# Patient Record
Sex: Female | Born: 1980 | Race: White | Hispanic: No | Marital: Married | Smoking: Current every day smoker
Health system: Southern US, Community
[De-identification: ages and names within clinical notes are randomized; demographics above are authoritative.]

## PROBLEM LIST (undated history)

## (undated) ENCOUNTER — Inpatient Hospital Stay (HOSPITAL_COMMUNITY): Payer: Self-pay

## (undated) DIAGNOSIS — F603 Borderline personality disorder: Secondary | ICD-10-CM

## (undated) DIAGNOSIS — F410 Panic disorder [episodic paroxysmal anxiety] without agoraphobia: Secondary | ICD-10-CM

## (undated) DIAGNOSIS — J45909 Unspecified asthma, uncomplicated: Secondary | ICD-10-CM

## (undated) DIAGNOSIS — G43909 Migraine, unspecified, not intractable, without status migrainosus: Secondary | ICD-10-CM

## (undated) DIAGNOSIS — E039 Hypothyroidism, unspecified: Secondary | ICD-10-CM

## (undated) DIAGNOSIS — F32A Depression, unspecified: Secondary | ICD-10-CM

## (undated) DIAGNOSIS — IMO0002 Reserved for concepts with insufficient information to code with codable children: Secondary | ICD-10-CM

## (undated) DIAGNOSIS — B977 Papillomavirus as the cause of diseases classified elsewhere: Secondary | ICD-10-CM

## (undated) DIAGNOSIS — R12 Heartburn: Secondary | ICD-10-CM

## (undated) DIAGNOSIS — F319 Bipolar disorder, unspecified: Secondary | ICD-10-CM

## (undated) DIAGNOSIS — O26899 Other specified pregnancy related conditions, unspecified trimester: Secondary | ICD-10-CM

## (undated) DIAGNOSIS — R87619 Unspecified abnormal cytological findings in specimens from cervix uteri: Secondary | ICD-10-CM

## (undated) DIAGNOSIS — F4310 Post-traumatic stress disorder, unspecified: Secondary | ICD-10-CM

## (undated) DIAGNOSIS — F259 Schizoaffective disorder, unspecified: Secondary | ICD-10-CM

## (undated) DIAGNOSIS — F419 Anxiety disorder, unspecified: Secondary | ICD-10-CM

## (undated) DIAGNOSIS — M419 Scoliosis, unspecified: Secondary | ICD-10-CM

## (undated) HISTORY — PX: COLPOSCOPY: SHX161

## (undated) HISTORY — DX: Schizoaffective disorder, unspecified: F25.9

## (undated) HISTORY — PX: WISDOM TOOTH EXTRACTION: SHX21

## (undated) HISTORY — PX: DILATION AND CURETTAGE OF UTERUS: SHX78

## (undated) HISTORY — PX: TUBAL LIGATION: SHX77

---

## 2007-10-26 HISTORY — PX: WISDOM TOOTH EXTRACTION: SHX21

## 2011-07-11 ENCOUNTER — Emergency Department (HOSPITAL_COMMUNITY)
Admission: EM | Admit: 2011-07-11 | Discharge: 2011-07-11 | Disposition: A | Discharge status: Home / Self Care | Payer: Self-pay | Attending: Emergency Medicine | Admitting: Emergency Medicine

## 2011-07-11 ENCOUNTER — Emergency Department (HOSPITAL_COMMUNITY): Payer: Self-pay

## 2011-07-11 DIAGNOSIS — R0989 Other specified symptoms and signs involving the circulatory and respiratory systems: Secondary | ICD-10-CM | POA: Insufficient documentation

## 2011-07-11 DIAGNOSIS — H5710 Ocular pain, unspecified eye: Secondary | ICD-10-CM | POA: Insufficient documentation

## 2011-07-11 DIAGNOSIS — R51 Headache: Secondary | ICD-10-CM | POA: Insufficient documentation

## 2011-07-11 DIAGNOSIS — R109 Unspecified abdominal pain: Secondary | ICD-10-CM | POA: Insufficient documentation

## 2011-07-11 DIAGNOSIS — R0609 Other forms of dyspnea: Secondary | ICD-10-CM | POA: Insufficient documentation

## 2011-07-11 DIAGNOSIS — F411 Generalized anxiety disorder: Secondary | ICD-10-CM | POA: Insufficient documentation

## 2011-07-11 LAB — DIFFERENTIAL
Basophils Absolute: 0.1 10*3/uL (ref 0.0–0.1)
Basophils Relative: 1 % (ref 0–1)
Eosinophils Absolute: 0.2 10*3/uL (ref 0.0–0.7)
Eosinophils Relative: 4 % (ref 0–5)
Monocytes Absolute: 0.5 10*3/uL (ref 0.1–1.0)

## 2011-07-11 LAB — BASIC METABOLIC PANEL
CO2: 26 mEq/L (ref 19–32)
Calcium: 8.7 mg/dL (ref 8.4–10.5)
Creatinine, Ser: 0.73 mg/dL (ref 0.50–1.10)
GFR calc non Af Amer: >60 mL/min (ref >60)
Glucose, Bld: 110 mg/dL — ABNORMAL HIGH (ref 70–99)

## 2011-07-11 LAB — URINALYSIS, ROUTINE W REFLEX MICROSCOPIC
Glucose, UA: NEGATIVE mg/dL
Ketones, ur: NEGATIVE mg/dL
Leukocytes, UA: NEGATIVE
Protein, ur: NEGATIVE mg/dL
Urobilinogen, UA: 1 mg/dL (ref 0.0–1.0)

## 2011-07-11 LAB — CBC
HCT: 33.3 % — ABNORMAL LOW (ref 36.0–46.0)
MCHC: 34.2 g/dL (ref 30.0–36.0)
Platelets: 277 10*3/uL (ref 150–400)
RDW: 13.6 % (ref 11.5–15.5)
WBC: 5.6 10*3/uL (ref 4.0–10.5)

## 2011-07-20 ENCOUNTER — Emergency Department (HOSPITAL_COMMUNITY)
Admission: EM | Admit: 2011-07-20 | Discharge: 2011-07-20 | Disposition: A | Discharge status: Home / Self Care | Payer: Medicaid Other | Attending: Emergency Medicine | Admitting: Emergency Medicine

## 2011-07-20 ENCOUNTER — Emergency Department (HOSPITAL_COMMUNITY): Payer: Medicaid Other

## 2011-07-20 DIAGNOSIS — R070 Pain in throat: Secondary | ICD-10-CM | POA: Insufficient documentation

## 2011-07-20 DIAGNOSIS — R05 Cough: Secondary | ICD-10-CM | POA: Insufficient documentation

## 2011-07-20 DIAGNOSIS — R0609 Other forms of dyspnea: Secondary | ICD-10-CM | POA: Insufficient documentation

## 2011-07-20 DIAGNOSIS — J3489 Other specified disorders of nose and nasal sinuses: Secondary | ICD-10-CM | POA: Insufficient documentation

## 2011-07-20 DIAGNOSIS — IMO0001 Reserved for inherently not codable concepts without codable children: Secondary | ICD-10-CM | POA: Insufficient documentation

## 2011-07-20 DIAGNOSIS — R0989 Other specified symptoms and signs involving the circulatory and respiratory systems: Secondary | ICD-10-CM | POA: Insufficient documentation

## 2011-07-20 DIAGNOSIS — R059 Cough, unspecified: Secondary | ICD-10-CM | POA: Insufficient documentation

## 2011-07-20 DIAGNOSIS — J40 Bronchitis, not specified as acute or chronic: Secondary | ICD-10-CM | POA: Insufficient documentation

## 2011-08-04 ENCOUNTER — Inpatient Hospital Stay (HOSPITAL_COMMUNITY)
Admission: AD | Admit: 2011-08-04 | Discharge: 2011-08-04 | Disposition: A | Discharge status: Home / Self Care | Payer: Medicaid Other | Source: Ambulatory Visit | Attending: Family Medicine | Admitting: Family Medicine

## 2011-08-04 ENCOUNTER — Encounter (HOSPITAL_COMMUNITY): Payer: Self-pay

## 2011-08-04 DIAGNOSIS — N921 Excessive and frequent menstruation with irregular cycle: Secondary | ICD-10-CM | POA: Insufficient documentation

## 2011-08-04 DIAGNOSIS — N926 Irregular menstruation, unspecified: Secondary | ICD-10-CM

## 2011-08-04 HISTORY — DX: Migraine, unspecified, not intractable, without status migrainosus: G43.909

## 2011-08-04 HISTORY — DX: Post-traumatic stress disorder, unspecified: F43.10

## 2011-08-04 HISTORY — DX: Borderline personality disorder: F60.3

## 2011-08-04 HISTORY — DX: Papillomavirus as the cause of diseases classified elsewhere: B97.7

## 2011-08-04 LAB — URINALYSIS, ROUTINE W REFLEX MICROSCOPIC
Bilirubin Urine: NEGATIVE
Glucose, UA: NEGATIVE mg/dL
Ketones, ur: NEGATIVE mg/dL
Protein, ur: NEGATIVE mg/dL
pH: 6 (ref 5.0–8.0)

## 2011-08-04 LAB — POCT PREGNANCY, URINE: Preg Test, Ur: NEGATIVE

## 2011-08-04 LAB — URINE MICROSCOPIC-ADD ON

## 2011-08-04 NOTE — ED Provider Notes (Signed)
Chart reviewed and agree with management and plan.  

## 2011-08-04 NOTE — ED Provider Notes (Signed)
History     CSN: [id number] Arrival date & time: 08/04/2011  2:05 PM  Chief Complaint  Patient presents with  . Metrorrhagia    HPI Suzanne Klein is a 30 y.o. female who presents to [facility] for pregnancy test and to become an established GYN patient. She states that her periods have changed from their usual. She has always had heavy bleeding every month that last for 7 days and for the past couple months they have only lasted 3 to 5 days.  She has had 2 full term deliveries. Her last period started 4 days ago and has already stopped.  No past medical history on file.  No past surgical history on file.  No family history on file.  History  Substance Use Topics  . Smoking status: Not on file  . Smokeless tobacco: Not on file  . Alcohol Use: Not on file    OB History    No data available      Review of Systems  Allergies  Review of patient's allergies indicates not on file.  Home Medications  No current outpatient prescriptions on file.  BP 109/55  Pulse 88  Temp(Src) 98.4 F (36.9 C) (Oral)  Resp 20  SpO2 98%  LMP 07/04/2011  Physical Exam  Nursing note and vitals reviewed. Constitutional: She is oriented to person, place, and time.       Obese white female in no acute distress.  HENT:  Head: Normocephalic.  Eyes: EOM are normal.  Neck: Neck supple.  Cardiovascular: Normal rate.   Pulmonary/Chest: Effort normal.  Musculoskeletal: Normal range of motion.  Neurological: She is alert and oriented to person, place, and time.  Skin: Skin is warm and dry.    ED Course  Procedures  Results for orders placed during the hospital encounter of 08/04/11 (from the past 24 hour(s))  POCT PREGNANCY, URINE     Status: Normal   Collection Time   08/04/11  2:27 PM      Component Value Range   Preg Test, Ur NEGATIVE     Assessment: Change in menses  Plan:  Appointment with GYN Clinic Monday November 19th at 1pm MDM          Kerrie Buffalo, NP 08/04/11 1446

## 2011-08-04 NOTE — Progress Notes (Signed)
Pt states she has been having some symptoms of pregnancy and her last period did not last as long. Wants to have a pregnancy test.

## 2011-08-05 ENCOUNTER — Inpatient Hospital Stay (INDEPENDENT_AMBULATORY_CARE_PROVIDER_SITE_OTHER)
Admission: RE | Admit: 2011-08-05 | Discharge: 2011-08-05 | Disposition: A | Discharge status: Home / Self Care | Payer: Self-pay | Source: Ambulatory Visit | Attending: Family Medicine | Admitting: Family Medicine

## 2011-08-05 DIAGNOSIS — A048 Other specified bacterial intestinal infections: Secondary | ICD-10-CM

## 2011-08-05 LAB — POCT URINALYSIS DIP (DEVICE)
Bilirubin Urine: NEGATIVE
Glucose, UA: NEGATIVE mg/dL
Leukocytes, UA: NEGATIVE
Nitrite: NEGATIVE

## 2011-08-05 LAB — POCT PREGNANCY, URINE: Preg Test, Ur: NEGATIVE

## 2011-08-05 LAB — POCT H PYLORI SCREEN: H. PYLORI SCREEN, POC: POSITIVE — AB

## 2011-09-13 ENCOUNTER — Encounter: Payer: Medicaid Other | Admitting: Obstetrics and Gynecology

## 2011-11-03 ENCOUNTER — Encounter (HOSPITAL_COMMUNITY): Payer: Self-pay | Admitting: Emergency Medicine

## 2011-11-03 ENCOUNTER — Emergency Department (HOSPITAL_COMMUNITY)
Admission: EM | Admit: 2011-11-03 | Discharge: 2011-11-03 | Disposition: A | Discharge status: Home / Self Care | Payer: Medicaid Other | Attending: Emergency Medicine | Admitting: Emergency Medicine

## 2011-11-03 DIAGNOSIS — D2360 Other benign neoplasm of skin of unspecified upper limb, including shoulder: Secondary | ICD-10-CM | POA: Insufficient documentation

## 2011-11-03 DIAGNOSIS — D229 Melanocytic nevi, unspecified: Secondary | ICD-10-CM

## 2011-11-03 LAB — GLUCOSE, CAPILLARY: Glucose-Capillary: 84 mg/dL (ref 70–99)

## 2011-11-03 MED ORDER — OXYCODONE-ACETAMINOPHEN 5-325 MG PO TABS
1.0000 | ORAL_TABLET | Freq: Once | ORAL | Status: AC
  Administered 2011-11-03: 1 via ORAL

## 2011-11-03 MED ORDER — MUPIROCIN CALCIUM 2 % EX CREA: TOPICAL_CREAM | Freq: Three times a day (TID) | CUTANEOUS | Status: AC

## 2011-11-03 MED ORDER — OXYCODONE-ACETAMINOPHEN 5-325 MG PO TABS
ORAL_TABLET | ORAL | Status: AC
  Filled 2011-11-03: qty 1

## 2011-11-03 MED ORDER — NAPROXEN 500 MG PO TABS: 500.0000 mg | ORAL_TABLET | Freq: Two times a day (BID) | ORAL | Status: DC

## 2011-11-03 NOTE — ED Notes (Signed)
CBG CHECKED BY EMT R [id number]

## 2011-11-03 NOTE — ED Provider Notes (Signed)
History     CSN: [id number]  Arrival date & time 11/03/11  1622   First MD Initiated Contact with Patient 11/03/11 1906      Chief Complaint  Patient presents with  . Rash    (Consider location/radiation/quality/duration/timing/severity/associated sxs/prior treatment) HPI Patient presents to the emergency room with complaints of a painful mole on her right axillary region. Patient states most been there for a prolonged period of time but for the last 2 week she's noted some irritation and discomfort. Patient spoke to someone for a resource Center who told her to the emergency room they could remove it. Patient denies any fevers or coughing. There's been no drainage. Past Medical History  Diagnosis Date  . HPV in female   . Migraines   . HPV in female   . PTSD (post-traumatic stress disorder)   . Borderline personality disorder     Past Surgical History  Procedure Date  . Cesarean section   . Colposcopy   . Wisdom tooth extraction     Family History  Problem Relation Age of Onset  . Cancer Mother   . Heart disease Mother     History  Substance Use Topics  . Smoking status: Current Everyday Smoker -- 0.5 packs/day  . Smokeless tobacco: Not on file  . Alcohol Use: No    OB History    Grav Para Term Preterm Abortions TAB SAB Ect Mult Living   3 1 1  1 1    2       Review of Systems  Constitutional: Negative for fever.    Allergies  Review of patient's allergies indicates no known allergies.  Home Medications  No current outpatient prescriptions on file.  BP 114/58  Pulse 86  Temp 98.6 F (37 C)  Resp 16  SpO2 99%  LMP 10/25/2011  Physical Exam  Nursing note and vitals reviewed. Constitutional: She appears well-developed and well-nourished. No distress.  HENT:  Head: Normocephalic and atraumatic.  Right Ear: External ear normal.  Left Ear: External ear normal.  Eyes: Conjunctivae are normal. Right eye exhibits no discharge. Left eye exhibits no  discharge. No scleral icterus.  Neck: Neck supple. No tracheal deviation present.  Cardiovascular: Normal rate.   Pulmonary/Chest: Effort normal. No stridor. No respiratory distress.  Musculoskeletal: She exhibits no edema.  Neurological: She is alert. Cranial nerve deficit: no gross deficits.  Skin: Skin is warm and dry. No rash noted.       Raised proximal half centimeter nevus right axillary region, scab on the superior aspect of the, tenderness palpation, no lymphangitic streaking, mild erythema of the base  Psychiatric: She has a normal mood and affect.    ED Course  Procedures (including critical care time)   Labs Reviewed  GLUCOSE, CAPILLARY   No results found.   Diagnosis: Nevus   MDM  Patient appears in a nevus that seemed to be getting irritated possibly from her clothing. There may be a mild component of a skin infection associated with it. There is no evidence of abscess. Patient will prescribe medications for pain. I will prescribe a antibiotic ointment. I will give her a referral to a dermatologist for possible excisional biopsy       Celene Kras, MD 11/03/11 [phone]

## 2011-11-03 NOTE — ED Notes (Signed)
Pt c/o painful mole under right arm for approx 2 weeks.

## 2011-12-03 ENCOUNTER — Emergency Department (HOSPITAL_COMMUNITY)
Admission: EM | Admit: 2011-12-03 | Discharge: 2011-12-03 | Disposition: A | Discharge status: Home / Self Care | Payer: Medicaid Other | Attending: Emergency Medicine | Admitting: Emergency Medicine

## 2011-12-03 ENCOUNTER — Encounter (HOSPITAL_COMMUNITY): Payer: Self-pay | Admitting: Emergency Medicine

## 2011-12-03 DIAGNOSIS — G43909 Migraine, unspecified, not intractable, without status migrainosus: Secondary | ICD-10-CM | POA: Insufficient documentation

## 2011-12-03 DIAGNOSIS — Z3201 Encounter for pregnancy test, result positive: Secondary | ICD-10-CM | POA: Insufficient documentation

## 2011-12-03 DIAGNOSIS — H53149 Visual discomfort, unspecified: Secondary | ICD-10-CM | POA: Insufficient documentation

## 2011-12-03 DIAGNOSIS — F17200 Nicotine dependence, unspecified, uncomplicated: Secondary | ICD-10-CM | POA: Insufficient documentation

## 2011-12-03 DIAGNOSIS — R112 Nausea with vomiting, unspecified: Secondary | ICD-10-CM | POA: Insufficient documentation

## 2011-12-03 LAB — DIFFERENTIAL
Eosinophils Absolute: 0.1 10*3/uL (ref 0.0–0.7)
Lymphs Abs: 2.3 10*3/uL (ref 0.7–4.0)
Neutrophils Relative %: 58 % (ref 43–77)

## 2011-12-03 LAB — COMPREHENSIVE METABOLIC PANEL
ALT: 12 U/L (ref 0–35)
Albumin: 3.8 g/dL (ref 3.5–5.2)
Alkaline Phosphatase: 46 U/L (ref 39–117)
GFR calc Af Amer: >90 mL/min (ref >90)
Glucose, Bld: 96 mg/dL (ref 70–99)
Potassium: 3.7 mEq/L (ref 3.5–5.1)
Sodium: 135 mEq/L (ref 135–145)
Total Protein: 7.1 g/dL (ref 6.0–8.3)

## 2011-12-03 LAB — CBC
MCH: 32.4 pg (ref 26.0–34.0)
Platelets: 229 10*3/uL (ref 150–400)
RBC: 4.07 MIL/uL (ref 3.87–5.11)
WBC: 7.1 10*3/uL (ref 4.0–10.5)

## 2011-12-03 LAB — URINALYSIS, ROUTINE W REFLEX MICROSCOPIC
Bilirubin Urine: NEGATIVE
Hgb urine dipstick: NEGATIVE
Specific Gravity, Urine: 1.013 (ref 1.005–1.030)
pH: 6 (ref 5.0–8.0)

## 2011-12-03 MED ORDER — ONDANSETRON 4 MG PO TBDP: 4.0000 mg | ORAL_TABLET | Freq: Once | ORAL | Status: DC

## 2011-12-03 MED ORDER — OXYCODONE-ACETAMINOPHEN 5-325 MG PO TABS: 1.0000 | ORAL_TABLET | Freq: Once | ORAL | Status: DC

## 2011-12-03 MED ORDER — MORPHINE SULFATE 2 MG/ML IJ SOLN
2.0000 mg | Freq: Once | INTRAMUSCULAR | Status: AC
  Administered 2011-12-03: 2 mg via INTRAVENOUS
  Filled 2011-12-03: qty 1

## 2011-12-03 MED ORDER — ONDANSETRON HCL 4 MG PO TABS: 4.0000 mg | ORAL_TABLET | Freq: Four times a day (QID) | ORAL | Status: AC

## 2011-12-03 MED ORDER — ONDANSETRON HCL 4 MG/2ML IJ SOLN
4.0000 mg | Freq: Once | INTRAMUSCULAR | Status: AC
  Administered 2011-12-03: 4 mg via INTRAVENOUS
  Filled 2011-12-03: qty 2

## 2011-12-03 MED ORDER — SODIUM CHLORIDE 0.9 % IV BOLUS (SEPSIS)
1000.0000 mL | Freq: Once | INTRAVENOUS | Status: AC
  Administered 2011-12-03: 1000 mL via INTRAVENOUS

## 2011-12-03 MED ORDER — MORPHINE SULFATE 4 MG/ML IJ SOLN: 4.0000 mg | Freq: Once | INTRAMUSCULAR | Status: DC

## 2011-12-03 MED ORDER — METOCLOPRAMIDE HCL 10 MG PO TABS: 10.0000 mg | ORAL_TABLET | Freq: Once | ORAL | Status: DC

## 2011-12-03 MED ORDER — IBUPROFEN 200 MG PO TABS: 600.0000 mg | ORAL_TABLET | Freq: Once | ORAL | Status: DC

## 2011-12-03 NOTE — ED Notes (Signed)
Pt d/c home in NAD. Pt voiced no concerns or needs.

## 2011-12-03 NOTE — ED Provider Notes (Signed)
History     CSN: [id number]  Arrival date & time 12/03/11  1217   First MD Initiated Contact with Patient 12/03/11 1228      Chief Complaint  Patient presents with  . Headache  . Emesis   Pt states she has bi-temporal HA since last night with multiple episodes of vomiting and photosensitivity. Pt states it is the same as her previous migraines. She also states she had a positive HCG on Friday and missed her menstrual period last month. No focal weakness, numbness, no abd pain,vaginal bleeding.  (Consider location/radiation/quality/duration/timing/severity/associated sxs/prior treatment) The history is provided by the patient.    Past Medical History  Diagnosis Date  . HPV in female   . Migraines   . HPV in female   . PTSD (post-traumatic stress disorder)   . Borderline personality disorder     Past Surgical History  Procedure Date  . Cesarean section   . Colposcopy   . Wisdom tooth extraction     Family History  Problem Relation Age of Onset  . Cancer Mother   . Heart disease Mother     History  Substance Use Topics  . Smoking status: Current Everyday Smoker -- 0.5 packs/day  . Smokeless tobacco: Not on file  . Alcohol Use: No    OB History    Grav Para Term Preterm Abortions TAB SAB Ect Mult Living   3 1 1  1 1    2       Review of Systems  Constitutional: Negative for fever and chills.  HENT: Negative for sore throat, neck pain and neck stiffness.   Respiratory: Negative for shortness of breath.   Cardiovascular: Negative for chest pain.  Gastrointestinal: Positive for nausea and vomiting. Negative for abdominal pain.  Genitourinary: Negative for flank pain and vaginal bleeding.  Musculoskeletal: Negative for back pain and joint swelling.  Skin: Negative for color change and pallor.  Neurological: Positive for headaches. Negative for dizziness, weakness and light-headedness.    Allergies  Review of patient's allergies indicates no known  allergies.  Home Medications   Current Outpatient Rx  Name Route Sig Dispense Refill  . ACETAMINOPHEN 500 MG PO TABS Oral Take 1,000 mg by mouth every 6 (six) hours as needed. headache      BP 118/50  Pulse 110  Temp(Src) 97.7 F (36.5 C) (Oral)  Resp 18  SpO2 95%  Physical Exam  Nursing note and vitals reviewed. Constitutional: She is oriented to person, place, and time. She appears well-developed and well-nourished. No distress.  HENT:  Head: Normocephalic and atraumatic.  Mouth/Throat: Oropharynx is clear and moist.  Eyes: EOM are normal. Pupils are equal, round, and reactive to light.  Neck: Normal range of motion. Neck supple.  Cardiovascular: Normal rate and regular rhythm.   Pulmonary/Chest: Effort normal and breath sounds normal. No respiratory distress. She has no wheezes. She has no rales.  Abdominal: Soft. Bowel sounds are normal. She exhibits no mass. There is no tenderness. There is no rebound and no guarding.  Musculoskeletal: Normal range of motion. She exhibits no edema and no tenderness.  Lymphadenopathy:    She has no cervical adenopathy.  Neurological: She is alert and oriented to person, place, and time.       5/5 motor, sensation intact.   Skin: Skin is warm and dry. No rash noted. No erythema.  Psychiatric: She has a normal mood and affect. Her behavior is normal.    ED Course  Procedures (including critical  care time)  Labs Reviewed - No data to display No results found.   No diagnosis found.    MDM    Pt states her HA is improved and is asking to be d/c'd home.         Loren Racer, MD 12/03/11 [phone]

## 2011-12-03 NOTE — ED Notes (Signed)
Pt in NAD. IV d/c. Pt states she feels much better.

## 2011-12-03 NOTE — ED Notes (Signed)
Pt c/o frontal migraine HA x 2 days; pt has hx of similar; pt sts LMP was in December and sts she had a positive home pregnancy test; pt sts N/V x several days

## 2011-12-03 NOTE — ED Notes (Signed)
Pt. Tolerated gingerale and crackers

## 2011-12-24 ENCOUNTER — Encounter (HOSPITAL_COMMUNITY): Payer: Self-pay | Admitting: *Deleted

## 2011-12-24 ENCOUNTER — Inpatient Hospital Stay (HOSPITAL_COMMUNITY)
Admission: AD | Admit: 2011-12-24 | Discharge: 2011-12-24 | Disposition: A | Discharge status: Home / Self Care | Payer: Medicaid Other | Source: Ambulatory Visit | Attending: Family Medicine | Admitting: Family Medicine

## 2011-12-24 DIAGNOSIS — O210 Mild hyperemesis gravidarum: Secondary | ICD-10-CM | POA: Insufficient documentation

## 2011-12-24 DIAGNOSIS — O219 Vomiting of pregnancy, unspecified: Secondary | ICD-10-CM

## 2011-12-24 HISTORY — DX: Reserved for concepts with insufficient information to code with codable children: IMO0002

## 2011-12-24 HISTORY — DX: Unspecified abnormal cytological findings in specimens from cervix uteri: R87.619

## 2011-12-24 LAB — URINALYSIS, ROUTINE W REFLEX MICROSCOPIC
Glucose, UA: NEGATIVE mg/dL
Leukocytes, UA: NEGATIVE
Specific Gravity, Urine: 1.025 (ref 1.005–1.030)
pH: 6 (ref 5.0–8.0)

## 2011-12-24 MED ORDER — LACTATED RINGERS IV BOLUS (SEPSIS)
1000.0000 mL | Freq: Once | INTRAVENOUS | Status: AC
  Administered 2011-12-24: 1000 mL via INTRAVENOUS

## 2011-12-24 MED ORDER — ONDANSETRON 8 MG PO TBDP: 8.0000 mg | ORAL_TABLET | Freq: Three times a day (TID) | ORAL | Status: AC | PRN

## 2011-12-24 MED ORDER — PROMETHAZINE HCL 25 MG/ML IJ SOLN
25.0000 mg | Freq: Once | INTRAMUSCULAR | Status: AC
  Administered 2011-12-24: 25 mg via INTRAVENOUS
  Filled 2011-12-24: qty 1

## 2011-12-24 MED ORDER — ONDANSETRON 8 MG PO TBDP
8.0000 mg | ORAL_TABLET | Freq: Once | ORAL | Status: AC
  Administered 2011-12-24: 8 mg via ORAL
  Filled 2011-12-24: qty 1

## 2011-12-24 MED ORDER — PROMETHAZINE HCL 25 MG PO TABS: 25.0000 mg | ORAL_TABLET | Freq: Four times a day (QID) | ORAL | Status: DC | PRN

## 2011-12-24 NOTE — ED Provider Notes (Signed)
Chart reviewed and agree with management and plan.  

## 2011-12-24 NOTE — ED Notes (Addendum)
States when she was seen on 2/8, she was given Morphine for a HA, fluids for dehydration and Zofran for nausea. States Zofran worked for [employer], but not now. Also tried ginger mints without relief.

## 2011-12-24 NOTE — ED Provider Notes (Signed)
History     Chief Complaint  Patient presents with  . Emesis  . Possible Pregnancy  . Abdominal Pain   HPI  Pt is [redacted]w[redacted]d pregnant and complains of nausea and vomiting for 1 1/2 weeks.  She was seen on Feb [facility] at [facility] with N/V headache and was given prescription for Zofran which held her until her prescription ran out.  She denies spotting or bleeding, pain with urination.  Pt has an OB appointment a local OB ?name  in 2 weeks  Past Medical History  Diagnosis Date  . HPV in female   . Migraines   . HPV in female   . PTSD (post-traumatic stress disorder)   . Borderline personality disorder   . Abnormal Pap smear     Past Surgical History  Procedure Date  . Cesarean section   . Colposcopy   . Wisdom tooth extraction     Family History  Problem Relation Age of Onset  . Cancer Mother   . Heart disease Mother     History  Substance Use Topics  . Smoking status: Former Smoker -- 0.5 packs/day    Quit date: 11/26/2011  . Smokeless tobacco: Never Used  . Alcohol Use: No    Allergies: No Known Allergies  Prescriptions prior to admission  Medication Sig Dispense Refill  . acetaminophen (TYLENOL) 500 MG tablet Take 1,000 mg by mouth every 6 (six) hours as needed. headache        ROS Physical Exam   Blood pressure 121/65, pulse 85, temperature 97.6 F (36.4 C), temperature source Oral, resp. rate 20, height 5' 6.5" (1.689 m), weight 220 lb 6.4 oz (99.973 kg), last menstrual period 10/21/2011.  Physical Exam  Vitals reviewed. Constitutional: She is oriented to person, place, and time. She appears well-developed and well-nourished.  HENT:  Head: Normocephalic.  Eyes: Pupils are equal, round, and reactive to light.  Neck: Normal range of motion. Neck supple.  Cardiovascular: Normal rate.   Respiratory: Effort normal.  GI: Soft.  Musculoskeletal: Normal range of motion.  Neurological: She is alert and oriented to person, place, and time.  Skin: Skin is warm and dry.    Psychiatric: She has a normal mood and affect.    [facility] Course  Procedures Pt got some relief with zofran but still feels nauseated to almost the point of vomiting.  Also, pt states her headache is getting worse and thinks because she has not eaten Will give IVF and phenergan IV Pt felt much better after IVF and phenergan PO fluids given and tolerated    Assessment and Plan  Nausea and vomiting in pregnancy Prescription for Zofran and Phenergan F/u with OB provider as scheduled  Suzanne Klein 12/24/2011, 12:12 PM

## 2011-12-24 NOTE — Progress Notes (Signed)
Can't keep anything down.  Off and on past month. Worse past wk.   States urine is getting darker. Having abd pain.  First appt is March 5, can't remember what office.

## 2011-12-28 ENCOUNTER — Other Ambulatory Visit: Payer: Self-pay

## 2011-12-28 LAB — OB RESULTS CONSOLE RUBELLA ANTIBODY, IGM: Rubella: IMMUNE

## 2011-12-28 LAB — OB RESULTS CONSOLE ANTIBODY SCREEN: Antibody Screen: NEGATIVE

## 2011-12-28 LAB — OB RESULTS CONSOLE GC/CHLAMYDIA: Gonorrhea: NEGATIVE

## 2012-01-05 ENCOUNTER — Other Ambulatory Visit: Payer: Self-pay | Admitting: Obstetrics

## 2012-01-05 DIAGNOSIS — Z3682 Encounter for antenatal screening for nuchal translucency: Secondary | ICD-10-CM

## 2012-01-10 ENCOUNTER — Ambulatory Visit (HOSPITAL_COMMUNITY)
Admission: RE | Admit: 2012-01-10 | Discharge: 2012-01-10 | Disposition: A | Discharge status: Home / Self Care | Payer: Medicaid Other | Source: Ambulatory Visit | Attending: Obstetrics | Admitting: Obstetrics

## 2012-01-10 ENCOUNTER — Encounter (HOSPITAL_COMMUNITY): Payer: Self-pay

## 2012-01-10 DIAGNOSIS — O358XX0 Maternal care for other (suspected) fetal abnormality and damage, not applicable or unspecified: Secondary | ICD-10-CM | POA: Insufficient documentation

## 2012-01-10 DIAGNOSIS — Z3689 Encounter for other specified antenatal screening: Secondary | ICD-10-CM | POA: Insufficient documentation

## 2012-01-10 DIAGNOSIS — O351XX0 Maternal care for (suspected) chromosomal abnormality in fetus, not applicable or unspecified: Secondary | ICD-10-CM | POA: Insufficient documentation

## 2012-01-10 DIAGNOSIS — Z3682 Encounter for antenatal screening for nuchal translucency: Secondary | ICD-10-CM

## 2012-01-10 DIAGNOSIS — O3510X0 Maternal care for (suspected) chromosomal abnormality in fetus, unspecified, not applicable or unspecified: Secondary | ICD-10-CM | POA: Insufficient documentation

## 2012-01-10 NOTE — ED Notes (Signed)
Patient has history of migraines.  Denies any cramping, leaking or bleeding

## 2012-01-10 NOTE — Progress Notes (Signed)
Genetic Counseling  High-Risk Gestation Note  Appointment Date:  01/10/2012 Referred By: Brock Bad, MD Date of Birth:  Oct 28, 1980 Partner:  Suzanne Klein     Pregnancy History: [id number] Estimated Date of Delivery: 07/27/12 Estimated Gestational Age: [redacted]w[redacted]d Attending: Particia Nearing, MD   [name] and her partner, Suzanne Klein, were seen for genetic counseling because of the previous ultrasound finding of cystic hygroma.   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 Complete ultrasound results reported separately. We discussed this finding in detail with the couple.  They were counseled that a cystic hygroma describes a septated fluid filled sac at the back of the neck that typically results from failure of the fetal lymphatic system.  We discussed the various etiologies for a hygroma including normal variation (immature lymphatic system), a chromosome condition, single gene condition, or a congenital anomaly (heart defect).  We discussed chromosomes, non-disjunction, age related risks for aneuploidy, and the 50-60% ultrasound adjusted risk for a chromosome condition based on the finding of a cystic hygroma.  We discussed the common features of Down syndrome, Turner syndrome, and trisomies 13 and 18.  They were counseled regarding the options of chorionic villus sampling (CVS) and amniocentesis for detection of fetal aneuploidy.  The risks, benefits, and limitations of this procedure were reviewed in detail.  A risk of 1 in 100 was given for CVS and 1 in 200-300 was given for amniocentesis, the primary complication being spontaneous pregnancy loss.  We discussed another type of screening test, noninvasive prenatal testing (NIPT), which utilizes cell free fetal DNA found in the maternal circulation. This test is not diagnostic for chromosome conditions, but can provide information regarding the presence or absence of extra fetal DNA for chromosomes 13, 18 and 21. Thus,  it would not identify or rule out all fetal aneuploidy. The reported detection rate is greater than 99% for Trisomy 21, greater than 97% for Trisomy 18, and is approximately 80% (8 out of 10) for Trisomy 13. The false positive rate is reported to be less than 1% for any of these conditions. After consideration of all the options, the couple elected to return at [redacted] weeks gestation for amniocentesis, and declined cell free fetal DNA testing and CVS. Amniocentesis is scheduled for 02/01/12.   We also discussed that a cystic hygroma can be a feature of an underlying single gene condition, such as Noonan syndrome or SLOS.  We discussed that prenatal diagnosis is available for some single gene conditions, but that in most cases targeted analysis is recommended by a medical geneticist following a post-natal physical exam and review of medical records.  If however, ultrasound findings or a positive family history strongly suggest an increased risk for a specific condition, testing may be performed prenatally.  Additionally, we discussed that congenital heart defects are commonly associated with cystic hygromas.  We reviewed the availability of a fetal echocardiogram at 20+ weeks gestation.  We discussed that the fetal prognosis is largely dependent upon the underlying cause of the hygroma, but that there are indications by ultrasound, such as hydrops, which significantly increase the likelihood of a poor prognosis.  They understand that cystic hygromas may worsen and progress to hydrops fetalis, improve and regress, or remain unchanged at follow up ultrasounds.   Both family histories were reviewed and found to be contributory for the patient's son, with a previous partner, requiring surgical correction of an open heart valve at age 75 years. Congenital heart defects occur in approximately 1% of  pregnancies.  Congenital heart defects may occur due to multifactorial influences, chromosomal abnormalities, genetic  syndromes or environmental exposures.  Isolated heart defects are generally multifactorial.  In the case of a second degree relative to the pregnancy affected and assuming multifactorial inheritance, the recurrence risk for a congenital heart defect is approximately 1-2%.  The patient reported that her son also has attention deficit hyperactivity disorder (ADHD), tourette syndrome, and obsessive compulsive disorder (OCD). 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 The father of the pregnancy reported that he also has mental health concerns. Mental health and behavioral health conditions are typically suspected to have multifactorial inheritance, with a combination of genetics and environment involved in their onset. However, they can also be seen as a feature of an underlying genetic condition. Assuming multifactorial inheritance, recurrence risk would be expected to be increased the couple's child(ren). When a known genetic cause is not identified, prenatal screening or diagnosis is not available for mental health conditions.   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 An underlying cause was not identified. We discussed that there are many different causes of learning delays such as genetic differences, sporadic causes, or injuries.  Without more specific information, potential genetic risks cannot be determined, though recurrence risk for the patient's offspring would be expected to be increased above the general population risk.  When there is not a known cause identified, screening or testing for learning delays is not available prenatally.   The patient reported that her mother has a history of three miscarriages. An underlying cause is not known. Approximately 1 in 6 confirmed pregnancies results in miscarriage. A single underlying cause is more likely to be suspected when a couple has experienced 3 or more losses.  Several possible  causes including chromosome rearrangements, antibodies, and thrombophilia. The majority of causes of recurrent miscarriage would not be expected to impact extended relatives. However, additional information is needed to accurately assess potential implications for relatives.   The patient reported that her maternal grandmother, several of her maternal grandmother's sisters, and her maternal grandmother's brother all have "thick tongues." They also were described to have possible other facial differences. They did not require surgical correction of tongue size and are reportedly otherwise healthy. Without further information regarding the provided family history, an accurate genetic risk cannot be calculated. Further genetic counseling is warranted if more information is obtained.   [name] denied exposure to environmental toxins or chemical agents. She denied the use of alcohol or street drugs.  She reported smoking approximately one pack of cigarettes per day. The associations of smoking in pregnancy were reviewed and cessation encouraged.  She denied significant viral illnesses during the course of her pregnancy. Her medical and surgical histories were contributory for agoraphobia and post traumatic stress disorder. She reported that she is currently not taking medication for treatment of these conditions.  The patient reported one previous TAB.   I counseled this couple regarding the above risks and available options.  The approximate face-to-face time with the genetic counselor was 35 minutes.  Suzanne Plowman, MS,  Certified Genetic Counselor 01/10/2012

## 2012-01-12 ENCOUNTER — Ambulatory Visit (HOSPITAL_COMMUNITY): Payer: Medicaid Other

## 2012-01-13 ENCOUNTER — Encounter (HOSPITAL_COMMUNITY): Payer: Self-pay

## 2012-01-13 ENCOUNTER — Inpatient Hospital Stay (HOSPITAL_COMMUNITY)
Admission: AD | Admit: 2012-01-13 | Discharge: 2012-01-13 | Disposition: A | Discharge status: Home / Self Care | Payer: Medicaid Other | Source: Ambulatory Visit | Attending: Obstetrics | Admitting: Obstetrics

## 2012-01-13 DIAGNOSIS — R51 Headache: Secondary | ICD-10-CM | POA: Insufficient documentation

## 2012-01-13 DIAGNOSIS — O219 Vomiting of pregnancy, unspecified: Secondary | ICD-10-CM

## 2012-01-13 DIAGNOSIS — O210 Mild hyperemesis gravidarum: Secondary | ICD-10-CM | POA: Insufficient documentation

## 2012-01-13 DIAGNOSIS — R1013 Epigastric pain: Secondary | ICD-10-CM | POA: Insufficient documentation

## 2012-01-13 LAB — URINALYSIS, ROUTINE W REFLEX MICROSCOPIC
Ketones, ur: NEGATIVE mg/dL
Leukocytes, UA: NEGATIVE
Nitrite: NEGATIVE
Specific Gravity, Urine: 1.02 (ref 1.005–1.030)
pH: 7.5 (ref 5.0–8.0)

## 2012-01-13 MED ORDER — PROMETHAZINE HCL 25 MG PO TABS
25.0000 mg | ORAL_TABLET | Freq: Four times a day (QID) | ORAL | Status: DC | PRN
Start: 2012-01-13 — End: 2012-07-14

## 2012-01-13 MED ORDER — ONDANSETRON 8 MG PO TBDP
8.0000 mg | ORAL_TABLET | Freq: Once | ORAL | Status: AC
  Administered 2012-01-13: 8 mg via ORAL
  Filled 2012-01-13: qty 1

## 2012-01-13 MED ORDER — M.V.I. ADULT IV INJ
10.0000 mL | Freq: Once | INTRAVENOUS | Status: AC
  Administered 2012-01-13: 10 mL via INTRAVENOUS
  Filled 2012-01-13: qty 10

## 2012-01-13 MED ORDER — ACETAMINOPHEN 325 MG PO TABS
650.0000 mg | ORAL_TABLET | Freq: Once | ORAL | Status: AC
  Administered 2012-01-13: 650 mg via ORAL
  Filled 2012-01-13: qty 2

## 2012-01-13 NOTE — Discharge Instructions (Signed)
Morning Sickness Morning sickness is when you feel sick to your stomach (nauseous) during pregnancy. You may feel sick to your stomach and throw up (vomit). You may feel sick in the morning, but you can feel this way any time of day. Some women feel very sick to their stomach and cannot stop throwing up (hyperemesis gravidarum). HOME CARE  Take multivitamins as told by your doctor. Taking multivitamins before getting pregnant can stop or lessen the harshness of morning sickness.   Eat dry toast or unsalted crackers before getting out of bed.   Eat 5 to 6 small meals a day.   Eat dry and bland foods like rice and baked potatoes.   Do not drink liquids with meals. Drink between meals.   Do not eat greasy, fatty, or spicy foods.   Have someone cook for you if the smell of food causes you to feel sick or throw up.   Do not take vitamins with iron, or as told by your doctor.   Eat protein when you need a snack (nuts, yogurt, cheese).   Eat unsweetened gelatins for dessert.   Wear a bracelet used for sea sickness (acupressure wristband).   Go to a doctor that puts thin needles into certain body points (acupuncture) to improve how you feel.   Do not smoke.   Use a humidifier to keep the air in your house free of odors.  GET HELP RIGHT AWAY IF:   You feel very sick to your stomach and cannot stop throwing up.   You pass out (faint).   You have a fever.   You need medicine to feel better.   You feel dizzy or lightheaded.   You are losing weight.   You need help knowing what to eat and what not to eat.  MAKE SURE YOU:   Understand these instructions.   Will watch your condition.   Will get help right away if you are not doing well or get worse.  Document Released: 11/18/2004 Document Revised: 09/30/2011 Document Reviewed: 01/08/2010 [employer] Patient Information 2012 [employer]. 

## 2012-01-13 NOTE — [facility]U Note (Signed)
Pt states has had n/v x3 days. Has vomited 3-4 times since last night. States hasn't been able to eat or drink today. Denies vaginal bleeding. Has mid abdominal pain that she states is b/c of the n/v.

## 2012-01-13 NOTE — MAU Provider Note (Signed)
History     CSN: [id number]  Arrival date and time: 01/13/12 1252   First Provider Initiated Contact with Patient 01/13/12 1313      No chief complaint on file.  HPI  Patient is a 31yr old pregnant female at 56 wk 0 d gestation presenting with 2 day history of nausea, vomiting, epigastric pain and headache.  Also reports dizziness that worsens when standing.  She reports that she ran out of phenergan and zofran yesterday and has not been able to keep food or fluids down since.  She notified her OB but prescription refills have not been called in. Patient also reports mild yellow vaginal discharge, increased urinary frequency and dysuria.  Denies hemoptysis or vaginal bleeding.   Past Medical History  Diagnosis Date  . HPV in female   . Migraines   . HPV in female   . PTSD (post-traumatic stress disorder)   . Borderline personality disorder   . Abnormal Pap smear     Past Surgical History  Procedure Date  . Cesarean section   . Colposcopy   . Wisdom tooth extraction     Family History  Problem Relation Age of Onset  . Cancer Mother   . Heart disease Mother   . Anesthesia problems Neg Hx     History  Substance Use Topics  . Smoking status: Former Smoker -- 0.5 packs/day    Quit date: 11/26/2011  . Smokeless tobacco: Never Used  . Alcohol Use: No    Allergies:  Allergies  Allergen Reactions  . Banana Rash  . Tomato Rash    Prescriptions prior to admission  Medication Sig Dispense Refill  . butalbital-acetaminophen-caffeine (FIORICET, ESGIC) 50-325-40 MG per tablet Take 1 tablet by mouth every 6 (six) hours as needed. For headache      . cholecalciferol (VITAMIN D) 1000 UNITS tablet Take 1,000 Units by mouth daily.      . ondansetron (ZOFRAN-ODT) 8 MG disintegrating tablet Take 8 mg by mouth every 8 (eight) hours as needed. For nausea      . Prenatal Vit-Fe Fumarate-FA (PRENATAL MULTIVITAMIN) TABS Take 1 tablet by mouth at bedtime.      . promethazine  (PHENERGAN) 25 MG tablet Take 25 mg by mouth 2 (two) times daily as needed. For nausea        Review of Systems  Constitutional: Positive for malaise/fatigue. Negative for fever, chills, weight loss and diaphoresis.  HENT: Positive for congestion. Negative for hearing loss and sore throat.   Eyes: Negative for blurred vision, double vision and photophobia.  Respiratory: Positive for cough (reports mild nonproductive cough). Negative for hemoptysis, shortness of breath, wheezing and stridor.   Cardiovascular: Negative for chest pain, palpitations, orthopnea and claudication.  Gastrointestinal: Positive for heartburn, nausea, vomiting and abdominal pain (epigastric). Negative for diarrhea, constipation, blood in stool and melena.  Genitourinary: Positive for dysuria and frequency.  Musculoskeletal: Negative for back pain and falls.  Skin: Negative for itching and rash.  Neurological: Positive for dizziness and headaches. Negative for tingling, tremors, sensory change, speech change, focal weakness, seizures, loss of consciousness and weakness.  Psychiatric/Behavioral: Negative for depression and suicidal ideas.   Physical Exam   Blood pressure 109/66, pulse 84, temperature 97.6 F (36.4 C), temperature source Oral, resp. rate 18, height 5' 6.5" (1.689 m), weight 101.152 kg (223 lb), last menstrual period 10/21/2011.  Physical Exam  Constitutional: She is oriented to person, place, and time. She appears well-developed and well-nourished.  HENT:  Head: Normocephalic  and atraumatic.  Eyes: Pupils are equal, round, and reactive to light.  Neck: Normal range of motion. Neck supple. No tracheal deviation present.  Cardiovascular: Normal rate, regular rhythm and normal heart sounds.  Exam reveals no gallop and no friction rub.   No murmur heard. Respiratory: Effort normal and breath sounds normal. No stridor.  GI: Soft. She exhibits no mass. There is tenderness (epigastric area). There is no  rebound and no guarding.  Neurological: She is alert and oriented to person, place, and time.  Skin: Skin is warm and dry.  Psychiatric: She has a normal mood and affect. Her behavior is normal. Judgment and thought content normal.   Results for orders placed during the hospital encounter of 01/13/12 (from the past 24 hour(s))  URINALYSIS, ROUTINE W REFLEX MICROSCOPIC     Status: Abnormal   Collection Time   01/13/12  1:08 PM      Component Value Range   Color, Urine YELLOW  YELLOW    APPearance HAZY (*) CLEAR    Specific Gravity, Urine 1.020  1.005 - 1.030    pH 7.5  5.0 - 8.0    Glucose, UA NEGATIVE  NEGATIVE (mg/dL)   Hgb urine dipstick NEGATIVE  NEGATIVE    Bilirubin Urine NEGATIVE  NEGATIVE    Ketones, ur NEGATIVE  NEGATIVE (mg/dL)   Protein, ur NEGATIVE  NEGATIVE (mg/dL)   Urobilinogen, UA 0.2  0.0 - 1.0 (mg/dL)   Nitrite NEGATIVE  NEGATIVE    Leukocytes, UA NEGATIVE  NEGATIVE    MAU Course  Procedures 1410- Consult with Dr. Clearance Coots will give 1 liter LR IV,  8mg  Zofran ODT and reassess 14:15 Jeani Sow, NP has assumed care of this patient.  I have reviewed the note and seen the patient and agree with the NP Student.  I spoke to Dr. Clearance Coots and reviewed the plan of care==hydrated with 1 liter of LR with Multivitamins and give Zofran 8mg  ODT for nausea.   15:26  IV fluid in, patient is feeling better except for headache.  Offered Tylenol.  Ordered. Will try crackers before discharge.   WIll discharge to home with Rx   15:45 Tolerated crackers without vomiting.   Patient reported to NP student that she will be homeless at the end of the month.  Social worker was called in.  Assessment and Plan  A:  Nausea and vomiting in pregnancy  P:  Rx for Phenergan 12.5mg  tabs #30.      Stressed importance of taking medication to prevent dehydration.     Keep scheduled appointment with Dr. Ashok Pall, Lillia Abed 01/13/2012, 1:34 PM

## 2012-01-14 NOTE — Progress Notes (Signed)
Clinical Social Work Department BRIEF PSYCHOSOCIAL ASSESSMENT 01/14/2012  Patient:  Suzanne Klein, Suzanne Klein     Account Number:  [id number]     Admit date:  01/13/2012  Clinical Social Worker:  Andy Gauss  Date/Time:  01/13/2012 04:00 PM  Referred by:  RN  Date Referred:  01/13/2012 Referred for  Homelessness   Other Referral:   Interview type:  Family Other interview type:    PSYCHOSOCIAL DATA Living Status:  HUSBAND Admitted from facility:   Level of care:   Primary support name:  Loleta Rose Primary support relationship to patient:  SPOUSE Degree of support available:   Involved    CURRENT CONCERNS Current Concerns  Other - See comment  Behavioral Health Issues   Other Concerns:    SOCIAL WORK ASSESSMENT / PLAN Sw met with pt and spouse at their request.  Pt and spouse told Sw that their lease is going to end April 1st and asked for housing resources. The couple is familiar with housing resources in the area, as they have lived at the [facility], inquired about [facility] (of which they don't meet admission criteria) and involved with [employer] center, [facility].  Additionally, the couple has applied for Section 8/Public Housing and placed on the waiting list.  The couple plans to go speak with Harvie Heck, Interior and spatial designer at the [facility] Monday morning to inquire about readmission to shelter.  Sw told pt about Room at the [facility] but advised that the shelter only accepts female clients. As Sw learned more about the pt, she learned that pt was diagnosed with PTSD and Agoraphobia.  Pt's spouse is currently on disability.  He list his diagnoses as, PTSD, bipolar mixed, conversion disorder and intermittent explosive disorder.  Since the pt and spouse have disabilities, they may qualify for Supportive Housing program.  Sw completed packet with them and waiting for supporting documentation from their physicians.  Pt is currently 3 months.  She a 32 year old  son who lives with her mother due to her instability/homelessness.  She denies a history with CPS.  This couple seems to be very resourceful and connected with communities agencies.  Sw will assist further once paperwork received.   Assessment/plan status:  Information/Referral to [facility] Other assessment/ plan:   Supportive Housing   Information/referral to community resources:   Couple recently started working with [employer].    PATIENT'S/FAMILY'S RESPONSE TO PLAN OF CARE: Pt and spouse thanked Sw for resources.

## 2012-02-01 ENCOUNTER — Ambulatory Visit (HOSPITAL_COMMUNITY)
Admission: RE | Admit: 2012-02-01 | Discharge: 2012-02-01 | Disposition: A | Discharge status: Home / Self Care | Payer: Medicaid Other | Source: Ambulatory Visit | Attending: Obstetrics | Admitting: Obstetrics

## 2012-02-01 ENCOUNTER — Other Ambulatory Visit: Payer: Self-pay

## 2012-02-01 ENCOUNTER — Other Ambulatory Visit: Payer: Self-pay | Admitting: Maternal and Fetal Medicine

## 2012-02-01 ENCOUNTER — Encounter (HOSPITAL_COMMUNITY): Payer: Self-pay

## 2012-02-01 ENCOUNTER — Other Ambulatory Visit (HOSPITAL_COMMUNITY): Payer: Self-pay | Admitting: Maternal and Fetal Medicine

## 2012-02-01 ENCOUNTER — Ambulatory Visit (HOSPITAL_COMMUNITY): Admission: RE | Admit: 2012-02-01 | Payer: Medicaid Other | Source: Ambulatory Visit

## 2012-02-01 ENCOUNTER — Emergency Department: Payer: Self-pay | Admitting: Emergency Medicine

## 2012-02-01 VITALS — BP 108/63 | HR 95 | Wt 219.0 lb

## 2012-02-01 DIAGNOSIS — O34219 Maternal care for unspecified type scar from previous cesarean delivery: Secondary | ICD-10-CM | POA: Insufficient documentation

## 2012-02-01 DIAGNOSIS — Z8751 Personal history of pre-term labor: Secondary | ICD-10-CM | POA: Insufficient documentation

## 2012-02-01 DIAGNOSIS — Z3682 Encounter for antenatal screening for nuchal translucency: Secondary | ICD-10-CM

## 2012-02-01 DIAGNOSIS — O3440 Maternal care for other abnormalities of cervix, unspecified trimester: Secondary | ICD-10-CM | POA: Insufficient documentation

## 2012-02-01 DIAGNOSIS — O352XX0 Maternal care for (suspected) hereditary disease in fetus, not applicable or unspecified: Secondary | ICD-10-CM | POA: Insufficient documentation

## 2012-02-01 DIAGNOSIS — IMO0002 Reserved for concepts with insufficient information to code with codable children: Secondary | ICD-10-CM

## 2012-02-01 DIAGNOSIS — Z1389 Encounter for screening for other disorder: Secondary | ICD-10-CM | POA: Insufficient documentation

## 2012-02-01 DIAGNOSIS — O358XX0 Maternal care for other (suspected) fetal abnormality and damage, not applicable or unspecified: Secondary | ICD-10-CM | POA: Insufficient documentation

## 2012-02-01 DIAGNOSIS — Z363 Encounter for antenatal screening for malformations: Secondary | ICD-10-CM | POA: Insufficient documentation

## 2012-02-01 LAB — ROUTINE CHROMOSOME - KARYOTYPE

## 2012-02-01 LAB — RH IG WORKUP (INCLUDES ABO/RH)
ABO/RH(D): B POS
Gestational Age(Wks): 15

## 2012-02-01 LAB — AP-AFP (ALPHA FETOPROTEIN)

## 2012-02-01 NOTE — ED Notes (Signed)
Blood drawn, blood type confirmed = B positive.

## 2012-02-02 LAB — COMPREHENSIVE METABOLIC PANEL
Calcium, Total: 8.6 mg/dL (ref 8.5–10.1)
Chloride: 108 mmol/L — ABNORMAL HIGH (ref 98–107)
Co2: 21 mmol/L (ref 21–32)
Creatinine: 0.47 mg/dL — ABNORMAL LOW (ref 0.60–1.30)
EGFR (African American): >60
Osmolality: 278 (ref 275–301)
Potassium: 3.7 mmol/L (ref 3.5–5.1)
SGOT(AST): 16 U/L (ref 15–37)
Sodium: 140 mmol/L (ref 136–145)

## 2012-02-02 LAB — URINALYSIS, COMPLETE
Bilirubin,UR: NEGATIVE
Ketone: NEGATIVE
RBC,UR: 1 /HPF (ref 0–5)
Specific Gravity: 1.029 (ref 1.003–1.030)
Squamous Epithelial: 2
WBC UR: 3 /HPF (ref 0–5)

## 2012-02-02 LAB — CBC
HGB: 11.8 g/dL — ABNORMAL LOW (ref 12.0–16.0)
MCH: 33 pg (ref 26.0–34.0)
MCV: 97 fL (ref 80–100)
Platelet: 195 10*3/uL (ref 150–440)
RDW: 13 % (ref 11.5–14.5)
WBC: 7.5 10*3/uL (ref 3.6–11.0)

## 2012-02-08 ENCOUNTER — Telehealth (HOSPITAL_COMMUNITY): Payer: Self-pay | Admitting: MS"

## 2012-02-08 NOTE — Telephone Encounter (Signed)
Called Makita Blow to discuss the final karyotype result from amniocentesis. We reviewed that these are within normal limits (46, XY).  We reviewed the limitations of amniocentesis and that this does not rule out all genetic conditions or birth defects.  All questions were answered to her satisfaction, she was encouraged to call with additional questions or concerns.  Suzanne Plowman, MS Patent attorney

## 2012-02-14 ENCOUNTER — Other Ambulatory Visit: Payer: Self-pay | Admitting: Obstetrics

## 2012-02-21 ENCOUNTER — Other Ambulatory Visit: Payer: Self-pay | Admitting: Obstetrics

## 2012-02-21 DIAGNOSIS — O358XX0 Maternal care for other (suspected) fetal abnormality and damage, not applicable or unspecified: Secondary | ICD-10-CM

## 2012-02-21 DIAGNOSIS — O352XX0 Maternal care for (suspected) hereditary disease in fetus, not applicable or unspecified: Secondary | ICD-10-CM

## 2012-02-21 DIAGNOSIS — O34219 Maternal care for unspecified type scar from previous cesarean delivery: Secondary | ICD-10-CM

## 2012-02-21 DIAGNOSIS — Z8751 Personal history of pre-term labor: Secondary | ICD-10-CM

## 2012-02-21 DIAGNOSIS — O3440 Maternal care for other abnormalities of cervix, unspecified trimester: Secondary | ICD-10-CM

## 2012-02-22 ENCOUNTER — Ambulatory Visit (HOSPITAL_COMMUNITY): Payer: Medicaid Other

## 2012-02-29 ENCOUNTER — Ambulatory Visit (HOSPITAL_COMMUNITY): Admission: RE | Admit: 2012-02-29 | Payer: Medicaid Other | Source: Ambulatory Visit

## 2012-03-07 ENCOUNTER — Ambulatory Visit (HOSPITAL_COMMUNITY)
Admission: RE | Admit: 2012-03-07 | Discharge: 2012-03-07 | Disposition: A | Discharge status: Home / Self Care | Payer: Medicaid Other | Source: Ambulatory Visit | Attending: Obstetrics | Admitting: Obstetrics

## 2012-03-07 VITALS — BP 125/71 | HR 100 | Wt 213.0 lb

## 2012-03-07 DIAGNOSIS — Z8751 Personal history of pre-term labor: Secondary | ICD-10-CM | POA: Insufficient documentation

## 2012-03-07 DIAGNOSIS — O352XX0 Maternal care for (suspected) hereditary disease in fetus, not applicable or unspecified: Secondary | ICD-10-CM | POA: Insufficient documentation

## 2012-03-07 DIAGNOSIS — O358XX0 Maternal care for other (suspected) fetal abnormality and damage, not applicable or unspecified: Secondary | ICD-10-CM | POA: Insufficient documentation

## 2012-03-07 DIAGNOSIS — Z1389 Encounter for screening for other disorder: Secondary | ICD-10-CM | POA: Insufficient documentation

## 2012-03-07 DIAGNOSIS — O34219 Maternal care for unspecified type scar from previous cesarean delivery: Secondary | ICD-10-CM | POA: Insufficient documentation

## 2012-03-07 DIAGNOSIS — O3440 Maternal care for other abnormalities of cervix, unspecified trimester: Secondary | ICD-10-CM | POA: Insufficient documentation

## 2012-03-07 DIAGNOSIS — Z363 Encounter for antenatal screening for malformations: Secondary | ICD-10-CM | POA: Insufficient documentation

## 2012-03-22 ENCOUNTER — Telehealth (HOSPITAL_COMMUNITY): Payer: Self-pay | Admitting: MS"

## 2012-03-22 NOTE — Telephone Encounter (Signed)
Called [name] regarding prenatal consultation with [facility] Pediatric [facility]astic [facility]ive [facility] given ultrasound finding of fetal cleft lip. Appointment is with Dr. Kelly Splinter on 6/14 at 10:45 am. Dr. Kelly Splinter sees patients in [place] at [address], [address] across from [facility]. Phone number for Dr. Leonie Green office at [facility] is [phone].   Reviewed with [name] that follow-up ultrasound is scheduled in our office on 6/11. Patient was encouraged to call with additional questions.   Quinn Plowman, MS Certified Genetic Counselor 03/22/2012 3:48 PM

## 2012-04-02 ENCOUNTER — Observation Stay: Payer: Self-pay | Admitting: Obstetrics and Gynecology

## 2012-04-02 LAB — CBC
HGB: 11.6 g/dL — ABNORMAL LOW (ref 12.0–16.0)
MCV: 97 fL (ref 80–100)
Platelet: 193 10*3/uL (ref 150–440)
RBC: 3.52 10*6/uL — ABNORMAL LOW (ref 3.80–5.20)
RDW: 13 % (ref 11.5–14.5)

## 2012-04-02 LAB — URINALYSIS, COMPLETE
Bilirubin,UR: NEGATIVE
Ph: 6 (ref 4.5–8.0)
Protein: 100
RBC,UR: 1 /HPF (ref 0–5)
WBC UR: 2 /HPF (ref 0–5)

## 2012-04-02 LAB — COMPREHENSIVE METABOLIC PANEL
Albumin: 3 g/dL — ABNORMAL LOW (ref 3.4–5.0)
BUN: 5 mg/dL — ABNORMAL LOW (ref 7–18)
Chloride: 107 mmol/L (ref 98–107)
Co2: 22 mmol/L (ref 21–32)
EGFR (African American): >60
Glucose: 77 mg/dL (ref 65–99)
Potassium: 3.7 mmol/L (ref 3.5–5.1)
SGPT (ALT): 11 U/L — ABNORMAL LOW
Total Protein: 6.5 g/dL (ref 6.4–8.2)

## 2012-04-02 LAB — HCG, QUANTITATIVE, PREGNANCY: Beta Hcg, Quant.: 6993 m[IU]/mL — ABNORMAL HIGH

## 2012-04-04 ENCOUNTER — Ambulatory Visit (HOSPITAL_COMMUNITY): Admission: RE | Admit: 2012-04-04 | Payer: Medicaid Other | Source: Ambulatory Visit

## 2012-04-05 ENCOUNTER — Encounter (HOSPITAL_COMMUNITY): Payer: Self-pay

## 2012-04-05 ENCOUNTER — Ambulatory Visit (HOSPITAL_COMMUNITY)
Admission: RE | Admit: 2012-04-05 | Discharge: 2012-04-05 | Disposition: A | Discharge status: Home / Self Care | Payer: Medicaid Other | Source: Ambulatory Visit | Attending: Obstetrics | Admitting: Obstetrics

## 2012-04-05 VITALS — BP 109/63 | HR 95 | Wt 211.5 lb

## 2012-04-05 DIAGNOSIS — O352XX0 Maternal care for (suspected) hereditary disease in fetus, not applicable or unspecified: Secondary | ICD-10-CM | POA: Insufficient documentation

## 2012-04-05 DIAGNOSIS — O34219 Maternal care for unspecified type scar from previous cesarean delivery: Secondary | ICD-10-CM | POA: Insufficient documentation

## 2012-04-05 DIAGNOSIS — Z8751 Personal history of pre-term labor: Secondary | ICD-10-CM | POA: Insufficient documentation

## 2012-04-05 DIAGNOSIS — O3440 Maternal care for other abnormalities of cervix, unspecified trimester: Secondary | ICD-10-CM | POA: Insufficient documentation

## 2012-04-05 DIAGNOSIS — O358XX0 Maternal care for other (suspected) fetal abnormality and damage, not applicable or unspecified: Secondary | ICD-10-CM | POA: Insufficient documentation

## 2012-04-05 NOTE — Progress Notes (Signed)
Patient seen today  for follow up ultrasound.  See full report in [facility].  Alpha Gula, MD  Patient had a normal fetal echo with Peds cardiology. Amniocentesis showed normal karyotype.  IUP at 24+4 weeks Unilateral cleft lip +/- palate; left Mild ventriculomegaly noted (1.09 cm) Mild left sided renal pylectasis appreciated (5 mm) without calyceal dilation Normal amniotic fluid volume  Measurements consistent with LMP dating    Findings and limitations of the study were discussed with [name].  She was scheduled to meet with Plastic surgery later this week, but now plans to move to [place] early next month and will deliver there. Recommend follow up as clinically indicated.  Patient will need to arrange follow up once she arrives in [place].

## 2012-04-13 ENCOUNTER — Other Ambulatory Visit: Payer: Self-pay | Admitting: Obstetrics

## 2012-04-13 DIAGNOSIS — IMO0002 Reserved for concepts with insufficient information to code with codable children: Secondary | ICD-10-CM

## 2012-04-19 ENCOUNTER — Ambulatory Visit (HOSPITAL_COMMUNITY)
Admission: RE | Admit: 2012-04-19 | Discharge: 2012-04-19 | Disposition: A | Discharge status: Home / Self Care | Payer: Medicaid Other | Source: Ambulatory Visit | Attending: Obstetrics | Admitting: Obstetrics

## 2012-04-19 ENCOUNTER — Encounter (HOSPITAL_COMMUNITY): Payer: Self-pay

## 2012-04-19 VITALS — BP 112/65 | HR 96 | Wt 214.0 lb

## 2012-04-19 DIAGNOSIS — O352XX0 Maternal care for (suspected) hereditary disease in fetus, not applicable or unspecified: Secondary | ICD-10-CM | POA: Insufficient documentation

## 2012-04-19 DIAGNOSIS — O3440 Maternal care for other abnormalities of cervix, unspecified trimester: Secondary | ICD-10-CM | POA: Insufficient documentation

## 2012-04-19 DIAGNOSIS — O358XX0 Maternal care for other (suspected) fetal abnormality and damage, not applicable or unspecified: Secondary | ICD-10-CM | POA: Insufficient documentation

## 2012-04-19 DIAGNOSIS — IMO0002 Reserved for concepts with insufficient information to code with codable children: Secondary | ICD-10-CM

## 2012-04-19 DIAGNOSIS — O350XX0 Maternal care for (suspected) central nervous system malformation in fetus, not applicable or unspecified: Secondary | ICD-10-CM | POA: Insufficient documentation

## 2012-04-19 DIAGNOSIS — O34219 Maternal care for unspecified type scar from previous cesarean delivery: Secondary | ICD-10-CM | POA: Insufficient documentation

## 2012-04-19 DIAGNOSIS — Z8751 Personal history of pre-term labor: Secondary | ICD-10-CM | POA: Insufficient documentation

## 2012-04-19 DIAGNOSIS — O3500X0 Maternal care for (suspected) central nervous system malformation or damage in fetus, unspecified, not applicable or unspecified: Secondary | ICD-10-CM | POA: Insufficient documentation

## 2012-05-17 ENCOUNTER — Ambulatory Visit (HOSPITAL_COMMUNITY): Admission: RE | Admit: 2012-05-17 | Payer: Medicaid Other | Source: Ambulatory Visit

## 2012-06-20 ENCOUNTER — Other Ambulatory Visit: Payer: Self-pay | Admitting: Obstetrics

## 2012-06-20 DIAGNOSIS — IMO0002 Reserved for concepts with insufficient information to code with codable children: Secondary | ICD-10-CM

## 2012-06-23 LAB — OB RESULTS CONSOLE GBS: GBS: NEGATIVE

## 2012-06-28 ENCOUNTER — Ambulatory Visit (HOSPITAL_COMMUNITY): Payer: Medicaid Other

## 2012-07-06 ENCOUNTER — Ambulatory Visit (HOSPITAL_COMMUNITY)
Admission: RE | Admit: 2012-07-06 | Discharge: 2012-07-06 | Disposition: A | Discharge status: Home / Self Care | Payer: Medicaid Other | Source: Ambulatory Visit | Attending: Obstetrics | Admitting: Obstetrics

## 2012-07-06 DIAGNOSIS — Z8751 Personal history of pre-term labor: Secondary | ICD-10-CM | POA: Insufficient documentation

## 2012-07-06 DIAGNOSIS — O3440 Maternal care for other abnormalities of cervix, unspecified trimester: Secondary | ICD-10-CM | POA: Insufficient documentation

## 2012-07-06 DIAGNOSIS — IMO0002 Reserved for concepts with insufficient information to code with codable children: Secondary | ICD-10-CM

## 2012-07-06 DIAGNOSIS — O34219 Maternal care for unspecified type scar from previous cesarean delivery: Secondary | ICD-10-CM | POA: Insufficient documentation

## 2012-07-06 DIAGNOSIS — O352XX0 Maternal care for (suspected) hereditary disease in fetus, not applicable or unspecified: Secondary | ICD-10-CM | POA: Insufficient documentation

## 2012-07-06 DIAGNOSIS — O350XX0 Maternal care for (suspected) central nervous system malformation in fetus, not applicable or unspecified: Secondary | ICD-10-CM | POA: Insufficient documentation

## 2012-07-06 DIAGNOSIS — O3500X0 Maternal care for (suspected) central nervous system malformation or damage in fetus, unspecified, not applicable or unspecified: Secondary | ICD-10-CM | POA: Insufficient documentation

## 2012-07-06 DIAGNOSIS — O358XX0 Maternal care for other (suspected) fetal abnormality and damage, not applicable or unspecified: Secondary | ICD-10-CM | POA: Insufficient documentation

## 2012-07-14 ENCOUNTER — Encounter (HOSPITAL_COMMUNITY): Payer: Self-pay | Admitting: Pharmacist

## 2012-07-18 ENCOUNTER — Encounter (HOSPITAL_COMMUNITY): Payer: Self-pay

## 2012-07-18 ENCOUNTER — Inpatient Hospital Stay (HOSPITAL_COMMUNITY): Admission: RE | Admit: 2012-07-18 | Payer: Medicaid Other | Source: Ambulatory Visit

## 2012-07-18 ENCOUNTER — Encounter (HOSPITAL_COMMUNITY)
Admission: RE | Admit: 2012-07-18 | Discharge: 2012-07-18 | Disposition: A | Discharge status: Home / Self Care | Payer: Medicaid Other | Source: Ambulatory Visit | Attending: Obstetrics | Admitting: Obstetrics

## 2012-07-18 HISTORY — DX: Heartburn: R12

## 2012-07-18 HISTORY — DX: Panic disorder (episodic paroxysmal anxiety): F41.0

## 2012-07-18 HISTORY — DX: Reserved for concepts with insufficient information to code with codable children: IMO0002

## 2012-07-18 HISTORY — DX: Other specified pregnancy related conditions, unspecified trimester: O26.899

## 2012-07-18 LAB — TYPE AND SCREEN
ABO/RH(D): B POS
Antibody Screen: NEGATIVE

## 2012-07-18 LAB — CBC
HCT: 34 % — ABNORMAL LOW (ref 36.0–46.0)
Hemoglobin: 11.4 g/dL — ABNORMAL LOW (ref 12.0–15.0)
MCH: 31.8 pg (ref 26.0–34.0)
MCHC: 33.5 g/dL (ref 30.0–36.0)
MCV: 95 fL (ref 78.0–100.0)
RDW: 13.4 % (ref 11.5–15.5)

## 2012-07-18 LAB — SURGICAL PCR SCREEN: Staphylococcus aureus: NEGATIVE

## 2012-07-18 NOTE — Patient Instructions (Addendum)
   Your procedure is scheduled on: Monday, Sept 30th  Enter through the Main Entrance of [facility] at: 10 am Pick up the phone at the desk and dial [phone] and inform us of your arrival.  Please call this number if you have any problems the morning of surgery: [phone]  Remember: Do not eat food after midnight: Sunday Do not drink clear liquids after: Sunday Take these medicines the morning of surgery with a SIP OF WATER: None  Do not wear jewelry, make-up, or FINGER nail polish No metal in your hair or on your body. Do not wear lotions, powders, perfumes or deodorant. Do not shave 48 hours prior to surgery. Do not bring valuables to the hospital. Contacts, dentures or bridgework may not be worn into surgery.  Leave suitcase in the car. After Surgery it may be brought to your room. For patients being admitted to the hospital, checkout time is 11:00am the day of discharge. Home with husband Jerrod.  Patients discharged on the day of surgery will not be allowed to drive home.     Remember to use your hibiclens as instructed.Please shower with 1/2 bottle the evening before your surgery and the other 1/2 bottle the morning of surgery. Neck down avoiding private area.

## 2012-07-19 LAB — RPR: RPR Ser Ql: NONREACTIVE

## 2012-07-20 ENCOUNTER — Encounter (HOSPITAL_COMMUNITY): Payer: Self-pay

## 2012-07-20 ENCOUNTER — Inpatient Hospital Stay (HOSPITAL_COMMUNITY)
Admission: AD | Admit: 2012-07-20 | Discharge: 2012-07-24 | DRG: 766 | Disposition: A | Discharge status: Home / Self Care | Payer: Medicaid Other | Source: Ambulatory Visit | Attending: Obstetrics | Admitting: Obstetrics

## 2012-07-20 DIAGNOSIS — O34219 Maternal care for unspecified type scar from previous cesarean delivery: Principal | ICD-10-CM | POA: Diagnosis present

## 2012-07-20 DIAGNOSIS — Z98891 History of uterine scar from previous surgery: Secondary | ICD-10-CM

## 2012-07-20 DIAGNOSIS — O479 False labor, unspecified: Secondary | ICD-10-CM | POA: Diagnosis present

## 2012-07-20 LAB — URINALYSIS, ROUTINE W REFLEX MICROSCOPIC
Bilirubin Urine: NEGATIVE
Glucose, UA: NEGATIVE mg/dL
Hgb urine dipstick: NEGATIVE
Ketones, ur: 40 mg/dL — AB
Protein, ur: 30 mg/dL — AB
Urobilinogen, UA: 0.2 mg/dL (ref 0.0–1.0)

## 2012-07-20 LAB — URINE MICROSCOPIC-ADD ON

## 2012-07-20 MED ORDER — DEXTROSE 5 % IN LACTATED RINGERS IV BOLUS
1000.0000 mL | Freq: Once | INTRAVENOUS | Status: AC
  Administered 2012-07-20: 1000 mL via INTRAVENOUS

## 2012-07-20 MED ORDER — ACETAMINOPHEN 325 MG PO TABS
650.0000 mg | ORAL_TABLET | ORAL | Status: DC | PRN
  Administered 2012-07-20: 650 mg via ORAL
  Filled 2012-07-20: qty 2

## 2012-07-20 MED ORDER — LACTATED RINGERS IV SOLN
INTRAVENOUS | Status: DC
  Administered 2012-07-21: 05:00:00 via INTRAVENOUS

## 2012-07-20 MED ORDER — GI COCKTAIL ~~LOC~~
30.0000 mL | ORAL | Status: AC
  Administered 2012-07-20: 30 mL via ORAL
  Filled 2012-07-20: qty 30

## 2012-07-20 MED ORDER — DOCUSATE SODIUM 100 MG PO CAPS
100.0000 mg | ORAL_CAPSULE | Freq: Every day | ORAL | Status: DC
  Administered 2012-07-22 – 2012-07-24 (×3): 100 mg via ORAL
  Filled 2012-07-20 (×4): qty 1

## 2012-07-20 MED ORDER — ZOLPIDEM TARTRATE 5 MG PO TABS
5.0000 mg | ORAL_TABLET | Freq: Every evening | ORAL | Status: DC | PRN
  Administered 2012-07-20 – 2012-07-22 (×2): 5 mg via ORAL
  Filled 2012-07-20 (×2): qty 1

## 2012-07-20 MED ORDER — CALCIUM CARBONATE ANTACID 500 MG PO CHEW
2.0000 | CHEWABLE_TABLET | ORAL | Status: DC | PRN
  Filled 2012-07-20: qty 2

## 2012-07-20 MED ORDER — PRENATAL MULTIVITAMIN CH
1.0000 | ORAL_TABLET | Freq: Every day | ORAL | Status: DC
  Filled 2012-07-20: qty 1

## 2012-07-20 NOTE — MAU Provider Note (Signed)
Chief Complaint:  No chief complaint on file.   First Provider Initiated Contact with Patient 07/20/12 2028      HPI: Suzanne Klein is a 31 y.o. [id number] at 45w5dwho presents to maternity admissions reporting multiple complaints including pelvic pressure, pain with urination, upper abdominal constant burning pain.  Pt reports contractions but indicates they are not regular.  She has a scheduled repeat C/S in 3 days.  She reports good fetal movement, denies LOF, vaginal bleeding, vaginal itching/burning, urinary symptoms, h/a, dizziness, n/v, or fever/chills.    Pregnancy Course:   Past Medical History: Past Medical History  Diagnosis Date  . HPV in female   . Migraines   . HPV in female   . Abnormal Pap smear   . Termination of pregnancy     x 1  . PTSD (post-traumatic stress disorder)     No meds  . Borderline personality disorder     No meds  . Panic anxiety syndrome     no meds  . Heartburn in pregnancy   . Lobar sclerosis     Past Surgical History: Past Surgical History  Procedure Date  . Cesarean section     x 2  . Colposcopy   . Wisdom tooth extraction   . Dilation and curettage of uterus     Family History: Family History  Problem Relation Age of Onset  . Cancer Mother   . Heart disease Mother   . Anesthesia problems Neg Hx     Social History: History  Substance Use Topics  . Smoking status: Current Every Day Smoker -- 0.5 packs/day for 13 years    Types: Cigarettes  . Smokeless tobacco: Never Used  . Alcohol Use: No    Allergies:  Allergies  Allergen Reactions  . Banana Rash  . Latex Rash  . Tomato Rash    Meds:  Prescriptions prior to admission  Medication Sig Dispense Refill  . butalbital-acetaminophen-caffeine (FIORICET, ESGIC) 50-325-40 MG per tablet Take 2 tablets by mouth every 6 (six) hours as needed. For headache      . oxyCODONE-acetaminophen (PERCOCET) 10-650 MG per tablet Take 1 tablet by mouth every 6 (six) hours as needed.  For pain      . Prenatal Vit-Fe Fumarate-FA (PRENATAL MULTIVITAMIN) TABS Take 1 tablet by mouth at bedtime.        ROS: Pertinent findings in history of present illness.  Physical Exam  Blood pressure 128/72, pulse 100, temperature 98.8 F (37.1 C), temperature source Oral, resp. rate 18, height 5\' 6"  (1.676 m), weight 91.627 kg (202 lb), last menstrual period 10/16/2011. GENERAL: Well-developed, well-nourished female in no acute distress.  HEENT: normocephalic HEART: normal rate RESP: normal effort ABDOMEN: Soft, non-tender, gravid appropriate for gestational age EXTREMITIES: Nontender, no edema NEURO: alert and oriented  Dilation: 5 Effacement (%): 80 Station: -3 Exam by:: Sharen Counter CNM  FHT:  Baseline 135 , moderate variability, accelerations present, no decelerations Contractions: irregular every 10-15 minutes   Labs: Results for orders placed during the hospital encounter of 07/20/12 (from the past 24 hour(s))  URINALYSIS, ROUTINE W REFLEX MICROSCOPIC     Status: Abnormal   Collection Time   07/20/12  7:41 PM      Component Value Range   Color, Urine YELLOW  YELLOW   APPearance CLOUDY (*) CLEAR   Specific Gravity, Urine >1.030 (*) 1.005 - 1.030   pH 6.0  5.0 - 8.0   Glucose, UA NEGATIVE  NEGATIVE mg/dL   Hgb urine  dipstick NEGATIVE  NEGATIVE   Bilirubin Urine NEGATIVE  NEGATIVE   Ketones, ur 40 (*) NEGATIVE mg/dL   Protein, ur 30 (*) NEGATIVE mg/dL   Urobilinogen, UA 0.2  0.0 - 1.0 mg/dL   Nitrite NEGATIVE  NEGATIVE   Leukocytes, UA SMALL (*) NEGATIVE  URINE MICROSCOPIC-ADD ON     Status: Abnormal   Collection Time   07/20/12  7:41 PM      Component Value Range   Squamous Epithelial / LPF MANY (*) RARE   WBC, UA 11-20  <3 WBC/hpf   Bacteria, UA FEW (*) RARE   Urine-Other MUCOUS PRESENT       Assessment: 1. Threatened labor at term     Plan: Called Dr Gaynell Face to discuss assessment and findings Terbutaline x1 dose SQ in MAU, IV fluids Admit to  antepartum for observation Continuous EFM Dr Gaynell Face assumed care of pt at this time    Medication List     As of 07/20/2012 10:09 PM    ASK your doctor about these medications         butalbital-acetaminophen-caffeine 50-325-40 MG per tablet   Commonly known as: FIORICET, ESGIC   Take 2 tablets by mouth every 6 (six) hours as needed. For headache      oxyCODONE-acetaminophen 10-650 MG per tablet   Commonly known as: PERCOCET   Take 1 tablet by mouth every 6 (six) hours as needed. For pain      prenatal multivitamin Tabs   Take 1 tablet by mouth at bedtime.        Sharen Counter Certified Nurse-Midwife 07/20/2012 10:09 PM

## 2012-07-20 NOTE — MAU Note (Signed)
Pt presents with lower abdominal pain,pressure in my southern region,  burning with urination, and repetitively states" it hurts"

## 2012-07-21 ENCOUNTER — Encounter (HOSPITAL_COMMUNITY): Payer: Self-pay

## 2012-07-21 ENCOUNTER — Encounter (HOSPITAL_COMMUNITY)
Admission: AD | Disposition: A | Discharge status: Home / Self Care | Payer: Self-pay | Source: Ambulatory Visit | Attending: Obstetrics

## 2012-07-21 ENCOUNTER — Encounter (HOSPITAL_COMMUNITY): Payer: Self-pay | Admitting: *Deleted

## 2012-07-21 ENCOUNTER — Inpatient Hospital Stay (HOSPITAL_COMMUNITY): Payer: Medicaid Other

## 2012-07-21 LAB — CBC
MCV: 93.8 fL (ref 78.0–100.0)
Platelets: 163 10*3/uL (ref 150–400)
RBC: 3.25 MIL/uL — ABNORMAL LOW (ref 3.87–5.11)
RDW: 13.2 % (ref 11.5–15.5)
WBC: 7 10*3/uL (ref 4.0–10.5)

## 2012-07-21 SURGERY — Surgical Case
Anesthesia: Spinal | Site: Abdomen | Wound class: Clean Contaminated

## 2012-07-21 MED ORDER — LORAZEPAM 2 MG/ML IJ SOLN
1.0000 mg | Freq: Once | INTRAMUSCULAR | Status: AC
  Administered 2012-07-21: 1 mg via INTRAVENOUS
  Filled 2012-07-21: qty 1

## 2012-07-21 MED ORDER — MEPERIDINE HCL 25 MG/ML IJ SOLN: 6.2500 mg | INTRAMUSCULAR | Status: DC | PRN

## 2012-07-21 MED ORDER — KETOROLAC TROMETHAMINE 60 MG/2ML IM SOLN
INTRAMUSCULAR | Status: AC
  Administered 2012-07-21: 60 mg via INTRAMUSCULAR
  Filled 2012-07-21: qty 2

## 2012-07-21 MED ORDER — OXYTOCIN 10 UNIT/ML IJ SOLN
INTRAMUSCULAR | Status: AC
  Filled 2012-07-21: qty 4

## 2012-07-21 MED ORDER — SCOPOLAMINE 1 MG/3DAYS TD PT72
1.0000 | MEDICATED_PATCH | Freq: Once | TRANSDERMAL | Status: AC
  Administered 2012-07-21: 1.5 mg via TRANSDERMAL

## 2012-07-21 MED ORDER — MORPHINE SULFATE 0.5 MG/ML IJ SOLN
INTRAMUSCULAR | Status: AC
  Filled 2012-07-21: qty 10

## 2012-07-21 MED ORDER — WITCH HAZEL-GLYCERIN EX PADS: 1.0000 "application " | MEDICATED_PAD | CUTANEOUS | Status: DC | PRN

## 2012-07-21 MED ORDER — SENNOSIDES-DOCUSATE SODIUM 8.6-50 MG PO TABS
2.0000 | ORAL_TABLET | Freq: Every day | ORAL | Status: DC
  Administered 2012-07-21 – 2012-07-23 (×3): 2 via ORAL

## 2012-07-21 MED ORDER — BUPIVACAINE IN DEXTROSE 0.75-8.25 % IT SOLN
INTRATHECAL | Status: DC | PRN
  Administered 2012-07-21: 1.6 mL via INTRATHECAL

## 2012-07-21 MED ORDER — SCOPOLAMINE 1 MG/3DAYS TD PT72
1.0000 | MEDICATED_PATCH | Freq: Once | TRANSDERMAL | Status: DC
  Filled 2012-07-21: qty 1

## 2012-07-21 MED ORDER — TETANUS-DIPHTH-ACELL PERTUSSIS 5-2.5-18.5 LF-MCG/0.5 IM SUSP
0.5000 mL | Freq: Once | INTRAMUSCULAR | Status: AC
  Administered 2012-07-22: 0.5 mL via INTRAMUSCULAR
  Filled 2012-07-21: qty 0.5

## 2012-07-21 MED ORDER — DIBUCAINE 1 % RE OINT: 1.0000 "application " | TOPICAL_OINTMENT | RECTAL | Status: DC | PRN

## 2012-07-21 MED ORDER — FENTANYL CITRATE 0.05 MG/ML IJ SOLN
INTRAMUSCULAR | Status: AC
  Filled 2012-07-21: qty 2

## 2012-07-21 MED ORDER — MORPHINE SULFATE (PF) 0.5 MG/ML IJ SOLN
INTRAMUSCULAR | Status: DC | PRN
  Administered 2012-07-21: 4.9 mg via INTRAVENOUS

## 2012-07-21 MED ORDER — KETOROLAC TROMETHAMINE 30 MG/ML IJ SOLN: 30.0000 mg | Freq: Four times a day (QID) | INTRAMUSCULAR | Status: DC | PRN

## 2012-07-21 MED ORDER — 0.9 % SODIUM CHLORIDE (POUR BTL) OPTIME
TOPICAL | Status: DC | PRN
  Administered 2012-07-21: 1000 mL

## 2012-07-21 MED ORDER — OXYTOCIN 10 UNIT/ML IJ SOLN
40.0000 [IU] | INTRAVENOUS | Status: DC | PRN
  Administered 2012-07-21: 40 [IU] via INTRAVENOUS

## 2012-07-21 MED ORDER — MORPHINE SULFATE (PF) 0.5 MG/ML IJ SOLN
INTRAMUSCULAR | Status: DC | PRN
  Administered 2012-07-21: .1 mg via INTRATHECAL

## 2012-07-21 MED ORDER — OXYCODONE-ACETAMINOPHEN 5-325 MG PO TABS
1.0000 | ORAL_TABLET | ORAL | Status: DC | PRN
  Administered 2012-07-21 (×3): 1 via ORAL
  Administered 2012-07-22 – 2012-07-24 (×11): 2 via ORAL
  Filled 2012-07-21 (×11): qty 2
  Filled 2012-07-21: qty 1
  Filled 2012-07-21 (×2): qty 2
  Filled 2012-07-21: qty 1

## 2012-07-21 MED ORDER — MEPERIDINE HCL 25 MG/ML IJ SOLN
INTRAMUSCULAR | Status: AC
  Filled 2012-07-21: qty 1

## 2012-07-21 MED ORDER — DIPHENHYDRAMINE HCL 50 MG/ML IJ SOLN: 25.0000 mg | INTRAMUSCULAR | Status: DC | PRN

## 2012-07-21 MED ORDER — METOCLOPRAMIDE HCL 5 MG/ML IJ SOLN: 10.0000 mg | Freq: Three times a day (TID) | INTRAMUSCULAR | Status: DC | PRN

## 2012-07-21 MED ORDER — DIPHENHYDRAMINE HCL 25 MG PO CAPS: 25.0000 mg | ORAL_CAPSULE | Freq: Four times a day (QID) | ORAL | Status: DC | PRN

## 2012-07-21 MED ORDER — NALBUPHINE HCL 10 MG/ML IJ SOLN
5.0000 mg | INTRAMUSCULAR | Status: DC | PRN
  Filled 2012-07-21: qty 1

## 2012-07-21 MED ORDER — SCOPOLAMINE 1 MG/3DAYS TD PT72
MEDICATED_PATCH | TRANSDERMAL | Status: AC
  Filled 2012-07-21: qty 1

## 2012-07-21 MED ORDER — PRENATAL MULTIVITAMIN CH
1.0000 | ORAL_TABLET | Freq: Every day | ORAL | Status: DC
  Administered 2012-07-22 – 2012-07-24 (×3): 1 via ORAL
  Filled 2012-07-21 (×3): qty 1

## 2012-07-21 MED ORDER — DIPHENHYDRAMINE HCL 50 MG/ML IJ SOLN: 12.5000 mg | INTRAMUSCULAR | Status: DC | PRN

## 2012-07-21 MED ORDER — MENTHOL 3 MG MT LOZG: 1.0000 | LOZENGE | OROMUCOSAL | Status: DC | PRN

## 2012-07-21 MED ORDER — CITRIC ACID-SODIUM CITRATE 334-500 MG/5ML PO SOLN
30.0000 mL | Freq: Once | ORAL | Status: AC
  Administered 2012-07-21: 30 mL via ORAL
  Filled 2012-07-21: qty 15

## 2012-07-21 MED ORDER — PROMETHAZINE HCL 25 MG/ML IJ SOLN: 6.2500 mg | INTRAMUSCULAR | Status: DC | PRN

## 2012-07-21 MED ORDER — LANOLIN HYDROUS EX OINT: 1.0000 "application " | TOPICAL_OINTMENT | CUTANEOUS | Status: DC | PRN

## 2012-07-21 MED ORDER — DIPHENHYDRAMINE HCL 25 MG PO CAPS
25.0000 mg | ORAL_CAPSULE | ORAL | Status: DC | PRN
  Administered 2012-07-24: 25 mg via ORAL
  Filled 2012-07-21: qty 1

## 2012-07-21 MED ORDER — KETOROLAC TROMETHAMINE 30 MG/ML IJ SOLN: 30.0000 mg | Freq: Four times a day (QID) | INTRAMUSCULAR | Status: AC | PRN

## 2012-07-21 MED ORDER — ONDANSETRON HCL 4 MG/2ML IJ SOLN: 4.0000 mg | Freq: Three times a day (TID) | INTRAMUSCULAR | Status: DC | PRN

## 2012-07-21 MED ORDER — SIMETHICONE 80 MG PO CHEW
80.0000 mg | CHEWABLE_TABLET | Freq: Three times a day (TID) | ORAL | Status: DC
  Administered 2012-07-21 – 2012-07-24 (×11): 80 mg via ORAL

## 2012-07-21 MED ORDER — ONDANSETRON HCL 4 MG/2ML IJ SOLN: 4.0000 mg | INTRAMUSCULAR | Status: DC | PRN

## 2012-07-21 MED ORDER — CEFAZOLIN SODIUM-DEXTROSE 2-3 GM-% IV SOLR
2.0000 g | Freq: Once | INTRAVENOUS | Status: DC
Start: 2012-07-21 — End: 2012-07-21
  Filled 2012-07-21: qty 50

## 2012-07-21 MED ORDER — LACTATED RINGERS IV SOLN: INTRAVENOUS | Status: DC

## 2012-07-21 MED ORDER — PNEUMOCOCCAL VAC POLYVALENT 25 MCG/0.5ML IJ INJ
0.5000 mL | INJECTION | INTRAMUSCULAR | Status: AC
  Administered 2012-07-22: 0.5 mL via INTRAMUSCULAR
  Filled 2012-07-21: qty 0.5

## 2012-07-21 MED ORDER — ONDANSETRON HCL 4 MG/2ML IJ SOLN
INTRAMUSCULAR | Status: DC | PRN
  Administered 2012-07-21: 4 mg via INTRAVENOUS

## 2012-07-21 MED ORDER — ZOLPIDEM TARTRATE 5 MG PO TABS: 5.0000 mg | ORAL_TABLET | Freq: Every evening | ORAL | Status: DC | PRN

## 2012-07-21 MED ORDER — SODIUM CHLORIDE 0.9 % IV SOLN
1.0000 ug/kg/h | INTRAVENOUS | Status: DC | PRN
  Filled 2012-07-21: qty 2.5

## 2012-07-21 MED ORDER — IBUPROFEN 600 MG PO TABS
600.0000 mg | ORAL_TABLET | Freq: Four times a day (QID) | ORAL | Status: DC
  Administered 2012-07-21 – 2012-07-24 (×11): 600 mg via ORAL
  Filled 2012-07-21 (×12): qty 1

## 2012-07-21 MED ORDER — LACTATED RINGERS IV SOLN
INTRAVENOUS | Status: DC | PRN
  Administered 2012-07-21: 02:00:00 via INTRAVENOUS

## 2012-07-21 MED ORDER — DIPHENHYDRAMINE HCL 25 MG PO CAPS: 25.0000 mg | ORAL_CAPSULE | ORAL | Status: DC | PRN

## 2012-07-21 MED ORDER — KETOROLAC TROMETHAMINE 30 MG/ML IJ SOLN: 15.0000 mg | Freq: Once | INTRAMUSCULAR | Status: DC | PRN

## 2012-07-21 MED ORDER — ONDANSETRON HCL 4 MG PO TABS: 4.0000 mg | ORAL_TABLET | ORAL | Status: DC | PRN

## 2012-07-21 MED ORDER — FENTANYL CITRATE 0.05 MG/ML IJ SOLN
INTRAMUSCULAR | Status: AC
  Filled 2012-07-21: qty 5

## 2012-07-21 MED ORDER — SODIUM CHLORIDE 0.9 % IJ SOLN: 3.0000 mL | INTRAMUSCULAR | Status: DC | PRN

## 2012-07-21 MED ORDER — NALOXONE HCL 0.4 MG/ML IJ SOLN: 0.4000 mg | INTRAMUSCULAR | Status: DC | PRN

## 2012-07-21 MED ORDER — FENTANYL CITRATE 0.05 MG/ML IJ SOLN
INTRAMUSCULAR | Status: DC | PRN
  Administered 2012-07-21: 87.5 ug via INTRAVENOUS
  Administered 2012-07-21: 12.5 ug via INTRATHECAL
  Administered 2012-07-21: 100 ug via INTRAVENOUS

## 2012-07-21 MED ORDER — OXYTOCIN 40 UNITS IN LACTATED RINGERS INFUSION - SIMPLE MED: 62.5000 mL/h | INTRAVENOUS | Status: AC

## 2012-07-21 MED ORDER — INFLUENZA VIRUS VACC SPLIT PF IM SUSP
0.5000 mL | INTRAMUSCULAR | Status: AC
  Administered 2012-07-22: 0.5 mL via INTRAMUSCULAR
  Filled 2012-07-21: qty 0.5

## 2012-07-21 MED ORDER — SIMETHICONE 80 MG PO CHEW: 80.0000 mg | CHEWABLE_TABLET | ORAL | Status: DC | PRN

## 2012-07-21 MED ORDER — ONDANSETRON HCL 4 MG/2ML IJ SOLN
INTRAMUSCULAR | Status: AC
  Filled 2012-07-21: qty 2

## 2012-07-21 MED ORDER — HYDROMORPHONE HCL PF 1 MG/ML IJ SOLN: 0.2500 mg | INTRAMUSCULAR | Status: DC | PRN

## 2012-07-21 MED ORDER — KETOROLAC TROMETHAMINE 60 MG/2ML IM SOLN
60.0000 mg | Freq: Once | INTRAMUSCULAR | Status: AC | PRN
  Filled 2012-07-21: qty 2

## 2012-07-21 MED ORDER — MEPERIDINE HCL 25 MG/ML IJ SOLN
INTRAMUSCULAR | Status: DC | PRN
  Administered 2012-07-21: 25 mg via INTRAVENOUS

## 2012-07-21 MED ORDER — TERBUTALINE SULFATE 1 MG/ML IJ SOLN
0.2500 mg | Freq: Once | INTRAMUSCULAR | Status: AC
  Administered 2012-07-21: 0.25 mg via SUBCUTANEOUS
  Filled 2012-07-21: qty 1

## 2012-07-21 MED ORDER — BUPIVACAINE HCL (PF) 0.25 % IJ SOLN
INTRAMUSCULAR | Status: AC
  Filled 2012-07-21: qty 30

## 2012-07-21 MED ORDER — KETOROLAC TROMETHAMINE 60 MG/2ML IM SOLN
60.0000 mg | Freq: Once | INTRAMUSCULAR | Status: AC | PRN
  Administered 2012-07-21: 60 mg via INTRAMUSCULAR

## 2012-07-21 SURGICAL SUPPLY — 37 items
CLOTH BEACON ORANGE TIMEOUT ST (SAFETY) ×2 IMPLANT
DERMABOND ADVANCED (GAUZE/BANDAGES/DRESSINGS) ×1
DERMABOND ADVANCED .7 DNX12 (GAUZE/BANDAGES/DRESSINGS) ×1 IMPLANT
DRAPE SURG 17X23 STRL (DRAPES) IMPLANT
DRSG COVADERM 4X10 (GAUZE/BANDAGES/DRESSINGS) IMPLANT
DURAPREP 26ML APPLICATOR (WOUND CARE) ×2 IMPLANT
ELECT REM PT RETURN 9FT ADLT (ELECTROSURGICAL) ×2
ELECTRODE REM PT RTRN 9FT ADLT (ELECTROSURGICAL) ×1 IMPLANT
EXTRACTOR VACUUM M CUP 4 TUBE (SUCTIONS) IMPLANT
GLOVE BIO SURGEON STRL SZ8.5 (GLOVE) IMPLANT
GLOVE SKINSENSE N 8.5 STRL (GLOVE) ×4 IMPLANT
GLOVE SKINSENSE NS SZ6.5 (GLOVE) ×2
GLOVE SKINSENSE NS SZ7.5 (GLOVE) ×1
GLOVE SKINSENSE NS SZ8.0 LF (GLOVE) ×1
GLOVE SKINSENSE STRL SZ6.5 (GLOVE) ×2 IMPLANT
GLOVE SKINSENSE STRL SZ7.5 (GLOVE) ×1 IMPLANT
GLOVE SKINSENSE STRL SZ8.0 LF (GLOVE) ×1 IMPLANT
GOWN PREVENTION PLUS LG XLONG (DISPOSABLE) ×4 IMPLANT
GOWN PREVENTION PLUS XXLARGE (GOWN DISPOSABLE) ×2 IMPLANT
KIT ABG SYR 3ML LUER SLIP (SYRINGE) IMPLANT
NEEDLE HYPO 25X5/8 SAFETYGLIDE (NEEDLE) ×2 IMPLANT
NS IRRIG 1000ML POUR BTL (IV SOLUTION) ×2 IMPLANT
PACK C SECTION WH (CUSTOM PROCEDURE TRAY) ×2 IMPLANT
PAD OB MATERNITY 4.3X12.25 (PERSONAL CARE ITEMS) ×2 IMPLANT
SLEEVE SCD COMPRESS KNEE MED (MISCELLANEOUS) ×2 IMPLANT
SUT CHROMIC 0 CT 802H (SUTURE) ×2 IMPLANT
SUT CHROMIC 1 CTX 36 (SUTURE) ×4 IMPLANT
SUT CHROMIC 2 0 SH (SUTURE) ×2 IMPLANT
SUT GUT PLAIN 0 CT-3 TAN 27 (SUTURE) IMPLANT
SUT MON AB 4-0 PS1 27 (SUTURE) ×2 IMPLANT
SUT VIC AB 0 CT1 18XCR BRD8 (SUTURE) IMPLANT
SUT VIC AB 0 CT1 8-18 (SUTURE)
SUT VIC AB 0 CTX 36 (SUTURE) ×2
SUT VIC AB 0 CTX36XBRD ANBCTRL (SUTURE) ×2 IMPLANT
TOWEL OR 17X24 6PK STRL BLUE (TOWEL DISPOSABLE) ×4 IMPLANT
TRAY FOLEY CATH 14FR (SET/KITS/TRAYS/PACK) IMPLANT
WATER STERILE IRR 1000ML POUR (IV SOLUTION) IMPLANT

## 2012-07-21 NOTE — Anesthesia Preprocedure Evaluation (Signed)
Anesthesia Evaluation  Patient identified by MRN, date of birth, ID band Patient awake    Reviewed: Allergy & Precautions, H&P , NPO status , Patient's Chart, lab work & pertinent test results  Airway Mallampati: II TM Distance: >3 FB Neck ROM: full    Dental No notable dental hx.    Pulmonary neg pulmonary ROS,    Pulmonary exam normal       Cardiovascular negative cardio ROS      Neuro/Psych    GI/Hepatic negative GI ROS, Neg liver ROS,   Endo/Other  negative endocrine ROS  Renal/GU negative Renal ROS  negative genitourinary   Musculoskeletal negative musculoskeletal ROS (+)   Abdominal Normal abdominal exam  (+)   Peds negative pediatric ROS (+)  Hematology negative hematology ROS (+)   Anesthesia Other Findings   Reproductive/Obstetrics (+) Pregnancy                           Anesthesia Physical Anesthesia Plan  ASA: II and Emergent  Anesthesia Plan: Spinal   Post-op Pain Management:    Induction:   Airway Management Planned:   Additional Equipment:   Intra-op Plan:   Post-operative Plan:   Informed Consent: I have reviewed the patients History and Physical, chart, labs and discussed the procedure including the risks, benefits and alternatives for the proposed anesthesia with the patient or authorized representative who has indicated his/her understanding and acceptance.     Plan Discussed with: CRNA and Surgeon  Anesthesia Plan Comments:         Anesthesia Quick Evaluation

## 2012-07-21 NOTE — Transfer of Care (Signed)
Immediate Anesthesia Transfer of Care Note  Patient: Suzanne Klein  Procedure(s) Performed: Procedure(s) (LRB) with comments: CESAREAN SECTION (N/A) - Repeat Cesarean Section Delivery Boy @ [phone], Apgars 9/9  Patient Location: PACU  Anesthesia Type: Spinal  Level of Consciousness: awake, alert , oriented and patient cooperative  Airway & Oxygen Therapy: Patient Spontanous Breathing  Post-op Assessment: Report given to PACU RN and Post -op Vital signs reviewed and stable  Post vital signs: Reviewed and stable  Complications: No apparent anesthesia complications

## 2012-07-21 NOTE — Progress Notes (Signed)
Dr. Gaynell Face phoned to advise in delay in OR case due to another patient in OR. Order received to give terbutaline 0.25mg  subcut and ativan 1 mg IM for anxiety.

## 2012-07-21 NOTE — Anesthesia Postprocedure Evaluation (Signed)
  Anesthesia Post-op Note  Patient: Suzanne Klein  Procedure(s) Performed: Procedure(s) (LRB) with comments: CESAREAN SECTION (N/A) - Repeat Cesarean Section Delivery Boy @ [phone], Apgars 9/9  Patient Location: PACU and Mother/Baby  Anesthesia Type: Spinal  Level of Consciousness: awake, alert  and oriented  Airway and Oxygen Therapy: Patient Spontanous Breathing  Post-op Pain: none  Post-op Assessment: Post-op Vital signs reviewed  Post-op Vital Signs: Reviewed and stable  Complications: No apparent anesthesia complications

## 2012-07-21 NOTE — Progress Notes (Signed)
Continues to ride outside in wheelchair with her husband.  Takes IV  Tubing out when going outside.  Patient rehooked IV when came in from outside,   IV not infusing after that.  Dr Clearance Coots notified this am .  Instructed to leave IV out.   Spero Geralds RN

## 2012-07-21 NOTE — H&P (Signed)
This is Dr. Francoise Ceo dictating the history and physical on  Suzanne Klein you and she's a 31 year old gravida 4 para [id number] who is had 2 previous C-sections her EDC is 07/22/2012 she is now 39 weeks and 6 days and and he is in contracting every 7 minutes her cervix is 5 cm 80% with the vertex at -2 station and she's scheduled for repeat C-section at this point Past medical history she is allergic to latex Past surgical history she's had 2 previous C-sections Social history negative System review negative Physical exam revealed a well-developed female in mild distress HEENT negative Breasts negative Heart regular rhythm no murmurs no gallops Abdomen term Pelvic as described above Extremities negative

## 2012-07-21 NOTE — Addendum Note (Signed)
Addendum  created 07/21/12 1412 by Gertie Fey, CRNA   Modules edited:Notes Section

## 2012-07-21 NOTE — Progress Notes (Signed)
At shift change, patient noted to be walking in room holding foley.  States I'm going outside.  Instructed patient to let her husband take her in the wheel chair.  Husband took baby to nursery.  He goes back into room and states his wife is gone.  Patient noted to be wheeling herself down hall in L&D looking for an exit.  She states  " I am very independent"  Husband then takes her out in wheelchair to smoke.   Spero Geralds RN  ( 6 hours after c/s delivery)

## 2012-07-21 NOTE — Anesthesia Procedure Notes (Signed)
Spinal  Patient location during procedure: OR Start time: 07/21/2012 1:35 AM End time: 07/21/2012 1:40 AM Staffing Anesthesiologist: Sandrea Hughs Performed by: anesthesiologist  Preanesthetic Checklist Completed: patient identified, site marked, surgical consent, pre-op evaluation, timeout performed, IV checked, risks and benefits discussed and monitors and equipment checked Spinal Block Patient position: sitting Prep: DuraPrep Patient monitoring: heart rate, cardiac monitor, continuous pulse ox and blood pressure Approach: midline Location: L3-4 Injection technique: single-shot Needle Needle type: Sprotte  Needle gauge: 24 G Needle length: 9 cm Needle insertion depth: 5 cm Assessment Sensory level: T4

## 2012-07-21 NOTE — Progress Notes (Signed)
Subjective: Postpartum Day 0: Cesarean Delivery Patient reports incisional pain and tolerating PO.    Objective: Vital signs in last 24 hours: Temp:  [97.4 F (36.3 C)-98.8 F (37.1 C)] 97.7 F (36.5 C) (09/27 0530) Pulse Rate:  [69-100] 79  (09/27 0530) Resp:  [11-28] 20  (09/27 0530) BP: (100-132)/(38-80) 120/60 mmHg (09/27 0530) SpO2:  [80 %-100 %] 98 % (09/27 0530) Weight:  [91.627 kg (202 lb)] 91.627 kg (202 lb) (09/26 2004)  Physical Exam:  General: alert and no distress Lochia: appropriate Uterine Fundus: firm Incision: healing well DVT Evaluation: No evidence of DVT seen on physical exam.   Basename 07/21/12 0020 07/18/12 1615  HGB 10.3* 11.4*  HCT 30.5* 34.0*    Assessment/Plan: Status post Cesarean section. Doing well postoperatively.  Continue current care.  Suzanne Klein A 07/21/2012, 11:02 AM

## 2012-07-21 NOTE — Op Note (Signed)
Previous cesarean section x2 at term in labor Postop diagnosis repeat low transverse cesarean section Surgeon Dr. Francoise Ceo Anesthesia spinal Procedure patient placed on the operating table in the supine position after the spinal administered abdomen prepped and draped bladder emptied with a Foley catheter a transverse incision made through the old scar carried down to the rectus fascia fascia cleaned and incised the length of the incision recti muscles retracted laterally and peritoneum incised longitudinally a transverse incision made on the visceroperitoneum above the bladder and the bladder mobilized inferiorly a transverse low uterine incision made the fluid was thin meconium and she delivered from the and OA position of a female Apgar 9 and 9 the placenta was anterior removed manually and sent to labor and delivery uterine cavity clean with dry laps the uterine incision closed in one layer with continuous    one chromic hemostasis was satisfactory and the bladder flap attachment reattached to a chromic the abdomen was closed in layers peritoneum continuous with 2-0 chromic fascia continuous suture of 0 Dexon and the skin shows a subcuticular stitch of 4-0 Monocryl blood loss was 500 cc patient tolerated the procedure well

## 2012-07-21 NOTE — Anesthesia Postprocedure Evaluation (Signed)
Anesthesia Post Note  Patient: Suzanne Klein  Procedure(s) Performed: Procedure(s) (LRB): CESAREAN SECTION (N/A)  Anesthesia type: Spinal  Patient location: PACU  Post pain: Pain level controlled  Post assessment: Post-op Vital signs reviewed  Last Vitals:  Filed Vitals:   07/21/12 0245  BP: 100/38  Pulse: 90  Temp: 36.6 C  Resp: 15    Post vital signs: Reviewed  Level of consciousness: awake  Complications: No apparent anesthesia complications

## 2012-07-21 NOTE — Progress Notes (Signed)
UR Chart review completed.  

## 2012-07-22 LAB — CBC
Hemoglobin: 9.4 g/dL — ABNORMAL LOW (ref 12.0–15.0)
MCH: 31.4 pg (ref 26.0–34.0)
Platelets: 129 10*3/uL — ABNORMAL LOW (ref 150–400)
RBC: 2.99 MIL/uL — ABNORMAL LOW (ref 3.87–5.11)
WBC: 6.7 10*3/uL (ref 4.0–10.5)
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
## 2012-07-22 NOTE — Progress Notes (Signed)
CSW met with DSS worker, MOB and FOB, in room for follow-up.  DSS plans to follow-up with MOB, FOB, and infant on Monday.  DSS worker states if MOB and infant ready to discharge before social worker makes contact on Monday this is okay.  No further barriers to discharge at this time. CSW signing off. Please reconsult CSW if further needs arise.    [phone] 

## 2012-07-22 NOTE — Progress Notes (Signed)
Subjective: Postpartum Day 1: Cesarean Delivery Patient reports tolerating PO and no problems voiding.    Objective: Vital signs in last 24 hours: Temp:  [97.3 F (36.3 C)-98.4 F (36.9 C)] 97.6 F (36.4 C) (09/28 0100) Pulse Rate:  [71-88] 76  (09/28 0100) Resp:  [18-20] 18  (09/28 0100) BP: (96-130)/(60-79) 110/70 mmHg (09/28 0100) SpO2:  [96 %-98 %] 97 % (09/28 0100)  Physical Exam:  General: alert and no distress Lochia: appropriate Uterine Fundus: firm Incision: healing well DVT Evaluation: No evidence of DVT seen on physical exam.   Basename 07/21/12 0020  HGB 10.3*  HCT 30.5*    Assessment/Plan: Status post Cesarean section. Doing well postoperatively.  Continue current care.  HARPER,CHARLES A 07/22/2012, 5:07 AM

## 2012-07-23 NOTE — Progress Notes (Signed)
Subjective: Postpartum Day 2: Cesarean Delivery Patient reports tolerating PO, + flatus and no problems voiding.    Objective: Vital signs in last 24 hours: Temp:  [98.3 F (36.8 C)-98.8 F (37.1 C)] 98.6 F (37 C) (09/29 0511) Pulse Rate:  [79-80] 80  (09/29 0511) Resp:  [18] 18  (09/29 0511) BP: (103-141)/(64-82) 103/64 mmHg (09/29 0511)  Physical Exam:  General: alert and no distress Lochia: appropriate Uterine Fundus: firm Incision: healing well DVT Evaluation: No evidence of DVT seen on physical exam.   Basename 07/22/12 0500 07/21/12 0020  HGB 9.4* 10.3*  HCT 28.2* 30.5*    Assessment/Plan: Status post Cesarean section. Doing well postoperatively.  Continue current care.  Suzanne Klein A 07/23/2012, 5:32 AM

## 2012-07-24 ENCOUNTER — Encounter (HOSPITAL_COMMUNITY): Payer: Self-pay | Admitting: Obstetrics

## 2012-07-24 ENCOUNTER — Inpatient Hospital Stay (HOSPITAL_COMMUNITY): Admission: AD | Admit: 2012-07-24 | Payer: Medicaid Other | Source: Ambulatory Visit | Admitting: Obstetrics

## 2012-07-24 ENCOUNTER — Encounter (HOSPITAL_COMMUNITY): Admission: AD | Payer: Self-pay | Source: Ambulatory Visit

## 2012-07-24 LAB — TYPE AND SCREEN
ABO/RH(D): B POS
Unit division: 0

## 2012-07-24 SURGERY — Surgical Case
Anesthesia: Regional

## 2012-07-24 MED ORDER — IBUPROFEN 600 MG PO TABS: 600.0000 mg | ORAL_TABLET | Freq: Four times a day (QID) | ORAL | Status: DC

## 2012-07-24 MED ORDER — OXYCODONE-ACETAMINOPHEN 10-325 MG PO TABS: 1.0000 | ORAL_TABLET | Freq: Four times a day (QID) | ORAL | Status: DC | PRN

## 2012-07-24 MED ORDER — OXYCODONE-ACETAMINOPHEN 5-325 MG PO TABS: 1.0000 | ORAL_TABLET | ORAL | Status: DC | PRN

## 2012-07-24 NOTE — Discharge Summary (Signed)
Obstetric Discharge Summary Reason for Admission: onset of labor Prenatal Procedures: ultrasound Intrapartum Procedures: cesarean: low cervical, transverse Postpartum Procedures: none Complications-Operative and Postpartum: none Hemoglobin  Date Value Range Status  07/22/2012 9.4* 12.0 - 15.0 g/dL Final     HCT  Date Value Range Status  07/22/2012 28.2* 36.0 - 46.0 % Final    Physical Exam:  General: alert and no distress Lochia: appropriate Uterine Fundus: firm Incision: healing well DVT Evaluation: No evidence of DVT seen on physical exam.  Discharge Diagnoses: Term Pregnancy-delivered  Discharge Information: Date: 07/24/2012 Activity: pelvic rest Diet: routine Medications: PNV, Ibuprofen, Colace and Percocet Condition: stable Instructions: refer to practice specific booklet Discharge to: home Follow-up Information    Follow up with Sharmain Lastra A, MD. Schedule an appointment as soon as possible for a visit in 2 weeks.   Contact information:   [address] [place] [address] [phone]          Newborn Data: Live born female  Birth Weight: 6 lb 15.3 oz (3155 g) APGAR: 9, 9  Home with mother.  Bentlie Withem A 07/24/2012, 6:42 AM

## 2012-07-24 NOTE — Progress Notes (Signed)
Subjective: Postpartum Day 3: Cesarean Delivery Patient reports no complaints.  Objective: Vital signs in last 24 hours: Temp:  [97.5 F (36.4 C)-98.4 F (36.9 C)] 98.4 F (36.9 C) (09/30 0531) Pulse Rate:  [78-95] 90  (09/30 0531) Resp:  [16-20] 16  (09/30 0531) BP: (120-133)/(73-88) 120/73 mmHg (09/30 0531)  Physical Exam:  General: alert and no distress Lochia: appropriate Uterine Fundus: firm Incision: healing well DVT Evaluation: No evidence of DVT seen on physical exam.   Basename 07/22/12 0500  HGB 9.4*  HCT 28.2*    Assessment/Plan: Status post Cesarean section. Doing well postoperatively.  Discharge home with standard precautions and return to clinic in 2weeks.  HARPER,CHARLES A 07/24/2012, 6:31 AM

## 2012-07-24 NOTE — Progress Notes (Signed)
Post discharge review completed. 

## 2012-08-29 ENCOUNTER — Encounter (HOSPITAL_COMMUNITY): Payer: Self-pay | Admitting: *Deleted

## 2012-08-29 ENCOUNTER — Emergency Department (HOSPITAL_COMMUNITY)
Admission: EM | Admit: 2012-08-29 | Discharge: 2012-08-29 | Discharge status: Against Medical Advice | Payer: Medicaid Other | Attending: Emergency Medicine | Admitting: Emergency Medicine

## 2012-08-29 DIAGNOSIS — N898 Other specified noninflammatory disorders of vagina: Secondary | ICD-10-CM | POA: Insufficient documentation

## 2012-08-29 NOTE — ED Notes (Signed)
Pt c-section in September 27.  Pt continues to have vaginal bleeding that is pure blood since mid October and states passing clots size of lemons and states she has 2 tampons in and using extremes to manage vaginal bleeding

## 2012-08-29 NOTE — ED Notes (Signed)
Patient states that she cannot wait anymore and she needs to go home to see her kids.  Explained patient the waiting process and she just wants to go home and check or her kids.  Apologized to patient about the wait time and advise her to return if her symptoms get worse.

## 2012-09-02 ENCOUNTER — Inpatient Hospital Stay (HOSPITAL_COMMUNITY): Payer: Medicaid Other

## 2012-09-02 ENCOUNTER — Encounter (HOSPITAL_COMMUNITY): Payer: Self-pay | Admitting: *Deleted

## 2012-09-02 ENCOUNTER — Inpatient Hospital Stay (HOSPITAL_COMMUNITY)
Admission: AD | Admit: 2012-09-02 | Discharge: 2012-09-02 | Disposition: A | Discharge status: Home / Self Care | Payer: Medicaid Other | Source: Ambulatory Visit | Attending: Obstetrics & Gynecology | Admitting: Obstetrics & Gynecology

## 2012-09-02 DIAGNOSIS — N939 Abnormal uterine and vaginal bleeding, unspecified: Secondary | ICD-10-CM

## 2012-09-02 DIAGNOSIS — N898 Other specified noninflammatory disorders of vagina: Secondary | ICD-10-CM

## 2012-09-02 HISTORY — DX: Scoliosis, unspecified: M41.9

## 2012-09-02 LAB — WET PREP, GENITAL
Trich, Wet Prep: NO GROWTH — AB
Yeast Wet Prep HPF POC: NONE SEEN

## 2012-09-02 LAB — CBC
HCT: 33.1 % — ABNORMAL LOW (ref 36.0–46.0)
Hemoglobin: 11 g/dL — ABNORMAL LOW (ref 12.0–15.0)
MCH: 31 pg (ref 26.0–34.0)
MCHC: 33.2 g/dL (ref 30.0–36.0)
MCV: 93.2 fL (ref 78.0–100.0)
Platelets: 198 10*3/uL (ref 150–400)
RBC: 3.55 MIL/uL — ABNORMAL LOW (ref 3.87–5.11)
RDW: 13.3 % (ref 11.5–15.5)
WBC: 6.9 10*3/uL (ref 4.0–10.5)

## 2012-09-02 LAB — URINALYSIS, ROUTINE W REFLEX MICROSCOPIC
Glucose, UA: NEGATIVE mg/dL
Ketones, ur: NEGATIVE mg/dL
Leukocytes, UA: NEGATIVE
Nitrite: NEGATIVE
Specific Gravity, Urine: >1.03 — ABNORMAL HIGH (ref 1.005–1.030)
pH: 5.5 (ref 5.0–8.0)

## 2012-09-02 LAB — URINE MICROSCOPIC-ADD ON

## 2012-09-02 LAB — SAMPLE TO BLOOD BANK

## 2012-09-02 LAB — POCT PREGNANCY, URINE: Preg Test, Ur: NEGATIVE

## 2012-09-02 MED ORDER — METRONIDAZOLE 500 MG PO TABS: 500.0000 mg | ORAL_TABLET | Freq: Two times a day (BID) | ORAL | Status: DC

## 2012-09-02 NOTE — MAU Provider Note (Signed)
History     CSN: [id number]  Arrival date & time 09/02/12  1116      Chief Complaint  Patient presents with  . Vaginal Bleeding    (Consider location/radiation/quality/duration/timing/severity/associated sxs/prior treatment) HPI Suzanne Klein isa 31 y.o. [phone]. She has repeat C/S 9/27. She started bleeding 1st week of October and had been bleeding off/on since. Bleeding is bright red with odor.  Can change every 1-2 hr with 2-4 cm clots. No cramping. Feels weak. No fever/chills.  Past Medical History  Diagnosis Date  . HPV in female   . Migraines   . HPV in female   . Abnormal Pap smear   . Termination of pregnancy     x 1  . PTSD (post-traumatic stress disorder)     No meds  . Borderline personality disorder     No meds  . Panic anxiety syndrome     no meds  . Heartburn in pregnancy   . Scoliosis of lumbar spine     Past Surgical History  Procedure Date  . Cesarean section     x 2  . Colposcopy   . Wisdom tooth extraction   . Dilation and curettage of uterus   . Cesarean section 07/21/2012    Procedure: CESAREAN SECTION;  Surgeon: Kathreen Cosier, MD;  Location: [facility];  Service: Obstetrics;  Laterality: N/A;  Repeat Cesarean Section Delivery Boy @ [phone], Apgars 9/9    Family History  Problem Relation Age of Onset  . Cancer Mother   . Heart disease Mother   . Anesthesia problems Neg Hx     History  Substance Use Topics  . Smoking status: Current Every Day Smoker -- 0.5 packs/day for 13 years    Types: Cigarettes  . Smokeless tobacco: Never Used  . Alcohol Use: No    OB History    Grav Para Term Preterm Abortions TAB SAB Ect Mult Living   4 3 2 1 1 1  0 0 0 3      Review of Systems  Constitutional: Positive for fatigue. Negative for fever and chills.  Genitourinary: Positive for vaginal bleeding.  Neurological: Positive for weakness.    Allergies  Banana; Latex; and Tomato  Home Medications  No current outpatient prescriptions on  file.  Ht 5' 6.75" (1.695 m)  Wt 183 lb 9.6 oz (83.28 kg)  BMI 28.97 kg/m2  Breastfeeding? Unknown  Physical Exam  Constitutional: She is oriented to person, place, and time. She appears well-developed and well-nourished.  Abdominal: Soft. She exhibits no distension. There is tenderness. There is no rebound.  Genitourinary:       Pelvic: Vulva- nl anatomy, skin intact  Vagina- tampon in vault, removed, strong odor, no bleeding noted Cx- closed, sl tender Uterus- enlarge, sl tender Adn- no masses palp, non tender  Musculoskeletal: Normal range of motion.  Neurological: She is alert and oriented to person, place, and time.  Skin: Skin is warm and dry.  Psychiatric: She has a normal mood and affect. Her behavior is normal.    ED Course  Procedures (including critical care time)   Labs Reviewed  POCT PREGNANCY, URINE  URINALYSIS, ROUTINE W REFLEX MICROSCOPIC  CBC  SAMPLE TO BLOOD BANK    Results for orders placed during the hospital encounter of 09/02/12 (from the past 24 hour(s))  URINALYSIS, ROUTINE W REFLEX MICROSCOPIC     Status: Abnormal   Collection Time   09/02/12 11:25 AM      Component Value Range  Color, Urine YELLOW  YELLOW   APPearance CLEAR  CLEAR   Specific Gravity, Urine >1.030 (*) 1.005 - 1.030   pH 5.5  5.0 - 8.0   Glucose, UA NEGATIVE  NEGATIVE mg/dL   Hgb urine dipstick MODERATE (*) NEGATIVE   Bilirubin Urine NEGATIVE  NEGATIVE   Ketones, ur NEGATIVE  NEGATIVE mg/dL   Protein, ur NEGATIVE  NEGATIVE mg/dL   Urobilinogen, UA 0.2  0.0 - 1.0 mg/dL   Nitrite NEGATIVE  NEGATIVE   Leukocytes, UA NEGATIVE  NEGATIVE  URINE MICROSCOPIC-ADD ON     Status: Abnormal   Collection Time   09/02/12 11:25 AM      Component Value Range   Squamous Epithelial / LPF FEW (*) RARE   WBC, UA 0-2  <3 WBC/hpf   RBC / HPF 0-2  <3 RBC/hpf   Bacteria, UA FEW (*) RARE  POCT PREGNANCY, URINE     Status: Normal   Collection Time   09/02/12 11:30 AM      Component Value Range    Preg Test, Ur NEGATIVE  NEGATIVE  CBC     Status: Abnormal   Collection Time   09/02/12 12:18 PM      Component Value Range   WBC 6.9  4.0 - 10.5 K/uL   RBC 3.55 (*) 3.87 - 5.11 MIL/uL   Hemoglobin 11.0 (*) 12.0 - 15.0 g/dL   HCT 16.1 (*) 09.6 - 04.5 %   MCV 93.2  78.0 - 100.0 fL   MCH 31.0  26.0 - 34.0 pg   MCHC 33.2  30.0 - 36.0 g/dL   RDW 40.9  81.1 - 91.4 %   Platelets 198  150 - 400 K/uL  SAMPLE TO BLOOD BANK     Status: Normal   Collection Time   09/02/12 12:19 PM      Component Value Range   Blood Bank Specimen SAMPLE AVAILABLE FOR TESTING     Sample Expiration 09/05/2012    WET PREP, GENITAL     Status: Abnormal   Collection Time   09/02/12  1:15 PM      Component Value Range   Yeast Wet Prep HPF POC NONE SEEN  NONE SEEN   Trich, Wet Prep NO GROWTH (*) NONE SEEN   Clue Cells Wet Prep HPF POC FEW (*) NONE SEEN   WBC, Wet Prep HPF POC FEW (*) NONE SEEN    US Transvaginal Non-ob  09/02/2012  *RADIOLOGY REPORT*  Clinical Data: Heavy bleeding.  C-section on 07/21/2012.  Rule out retained products of conception.  TRANSABDOMINAL AND TRANSVAGINAL ULTRASOUND OF PELVIS Technique:  Both transabdominal and transvaginal ultrasound examinations of the pelvis were performed. Transabdominal technique was performed for global imaging of the pelvis including uterus, ovaries, adnexal regions, and pelvic cul-de-sac.  It was necessary to proceed with endovaginal exam following the transabdominal exam to visualize the endometrium and adnexal regions.  Comparison:  Previous exams  Findings:  Uterus: 8.1 x 4.7 x 6.2 cm.  Fluid is identified within the endometrial canal.  Endometrium: 4.3 mm.  Right ovary:  2.9 x 1.7 x 2.3 cm.  Left ovary: 3.7 x 1.3 x 1.9 cm.  Other findings: No free fluid  IMPRESSION:  1.  Thin, homogeneous endometrium, containing a small amount of fluid. 2.  No evidence for retained products of conception. 3.  Normal-appearing ovaries.   Original Report Authenticated By: Norva Pavlov, M.D.    US Pelvis Complete  09/02/2012  *RADIOLOGY REPORT*  Clinical Data:  Heavy bleeding.  C-section on 07/21/2012.  Rule out retained products of conception.  TRANSABDOMINAL AND TRANSVAGINAL ULTRASOUND OF PELVIS Technique:  Both transabdominal and transvaginal ultrasound examinations of the pelvis were performed. Transabdominal technique was performed for global imaging of the pelvis including uterus, ovaries, adnexal regions, and pelvic cul-de-sac.  It was necessary to proceed with endovaginal exam following the transabdominal exam to visualize the endometrium and adnexal regions.  Comparison:  Previous exams  Findings:  Uterus: 8.1 x 4.7 x 6.2 cm.  Fluid is identified within the endometrial canal.  Endometrium: 4.3 mm.  Right ovary:  2.9 x 1.7 x 2.3 cm.  Left ovary: 3.7 x 1.3 x 1.9 cm.  Other findings: No free fluid  IMPRESSION:  1.  Thin, homogeneous endometrium, containing a small amount of fluid. 2.  No evidence for retained products of conception. 3.  Normal-appearing ovaries.   Original Report Authenticated By: Norva Pavlov, M.D.       A- bleeding and odor post partum with nl Hgb, WBC   P-Flagyl 500 mg bid x 7 F/u with  Dr Clearance Coots     MDM

## 2012-09-02 NOTE — ED Notes (Signed)
Pt is post partum 07/21/12. Bleeding has  Been heavy since the first week in October. Went to 6 week appointment (he did not look down there just at c-section scar did not mention the bleeding to him either.  Tried to see doctor this week but "they would not see me because I did not have $3 co-pay.

## 2012-09-02 NOTE — MAU Note (Signed)
Patient states she has had vaginal bleeding since the first week in October. States she bleeds every day and has passed lemon size clots. Blood has a foul smell and now having abdominal pain.

## 2012-10-07 IMAGING — US US OB FOLLOW-UP
1 series · 12 of 28 positions shown · non-contrast
Comparison: none

[Series 1: us ob follow-up · 0.26mm/px · 12 of 61 slices shown]
[im 3/61]
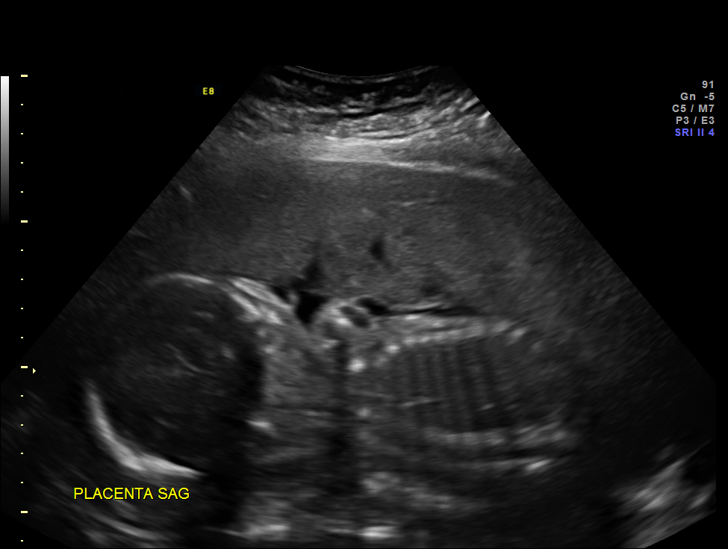
[im 7/61]
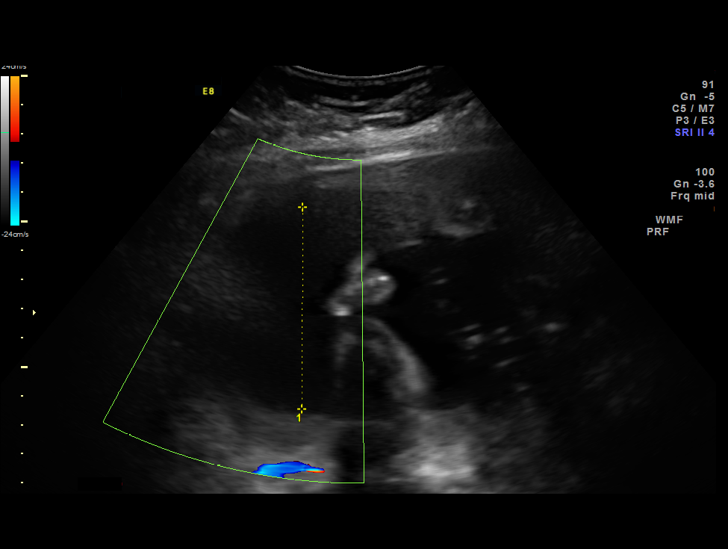
[im 12/61]
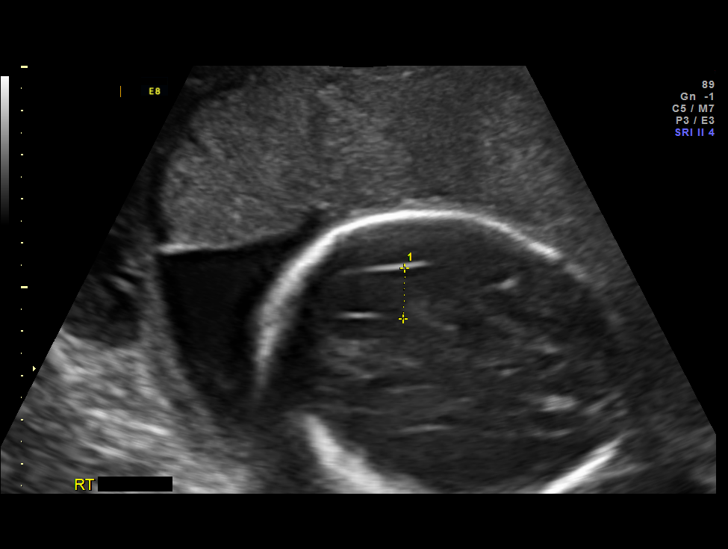
[im 18/61]
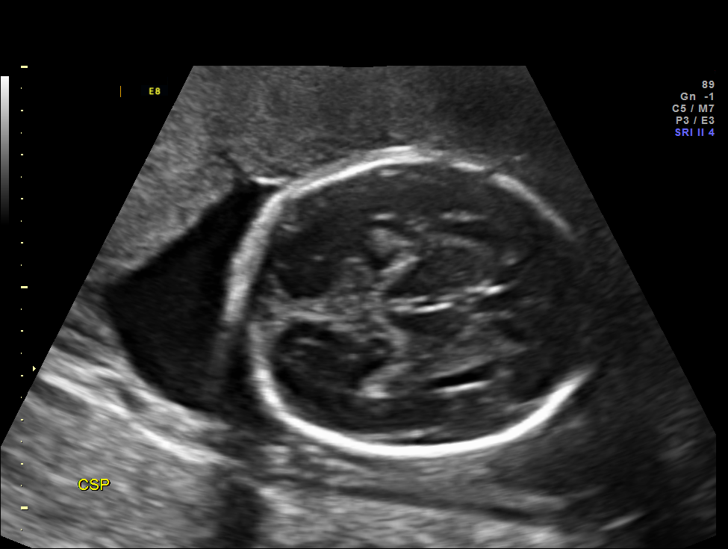
[im 23/61]
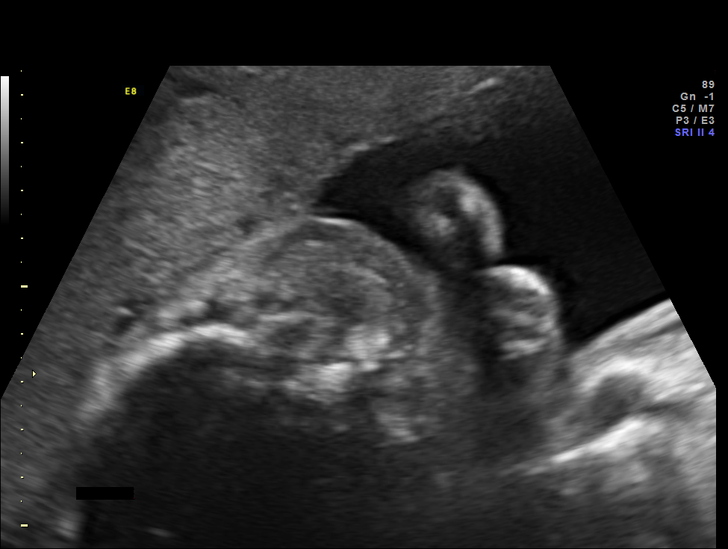
[im 27/61]
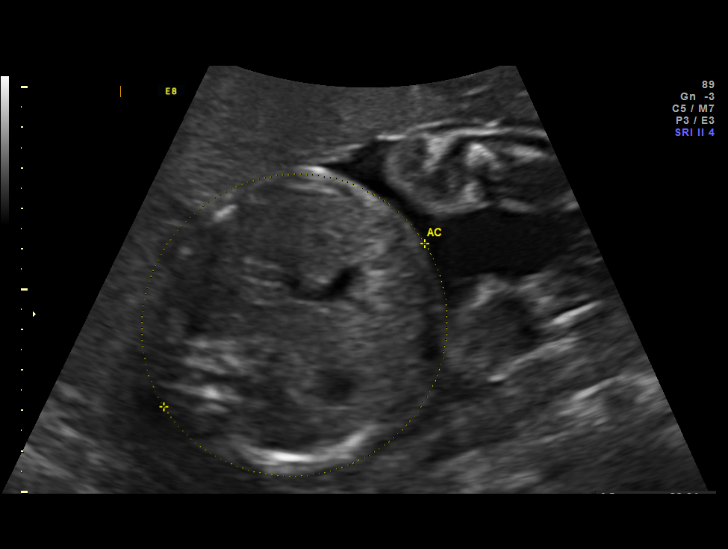
[im 34/61]
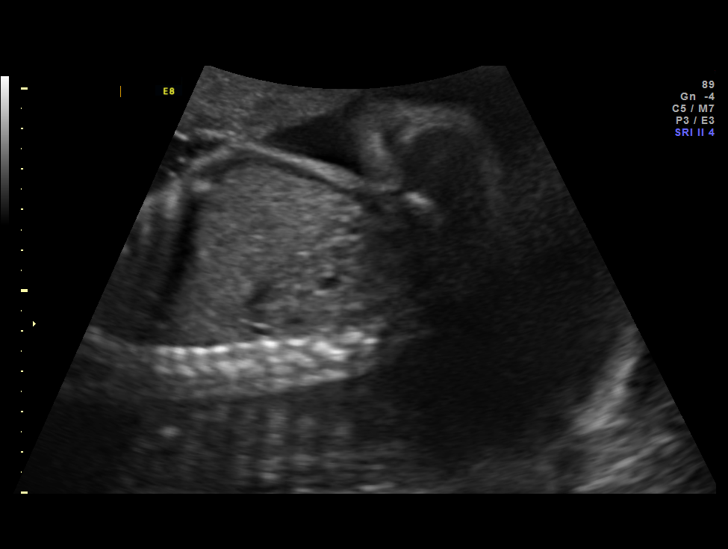
[im 38/61]
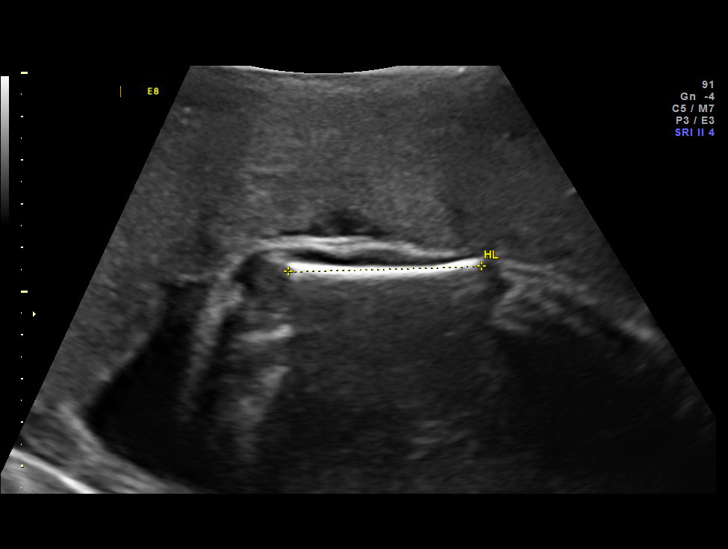
[im 43/61]
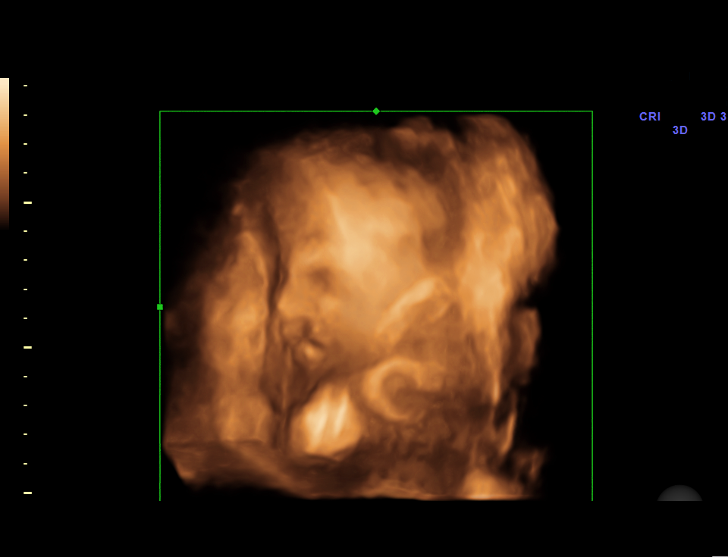
[im 49/61]
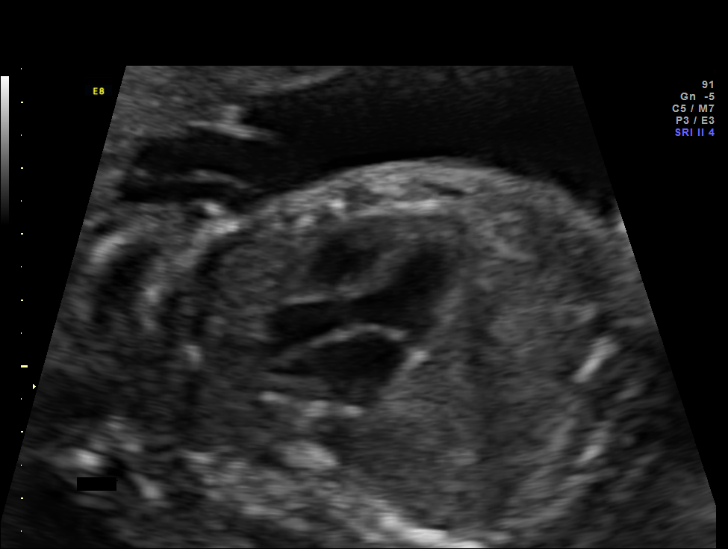
[im 54/61]
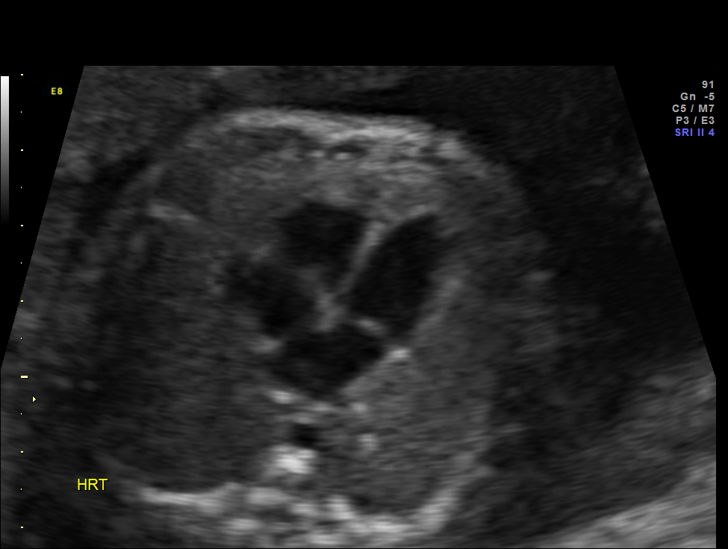
[im 58/61]
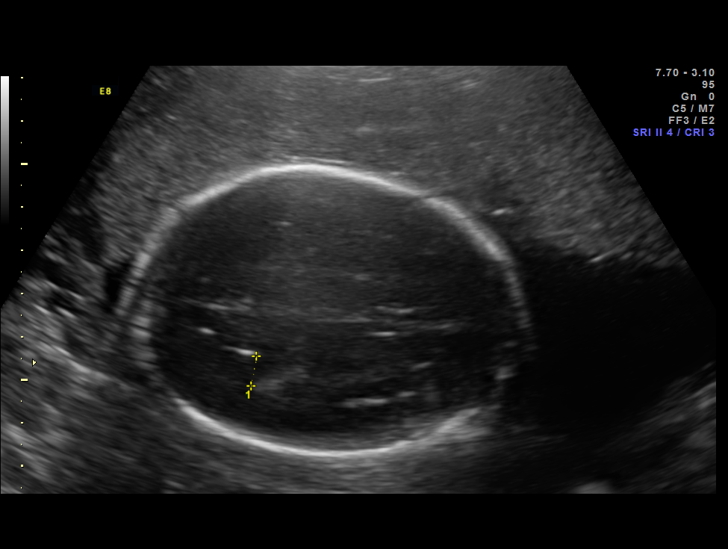

[12 of 28 positions shown; findings below may reference images not displayed]

OBSTETRICS REPORT
                      (Signed Final 04/19/2012 [DATE])

 Order#:         [id number]
Procedures

 US OB FOLLOW UP                                       76816.1
Indications

 Cleft lip, unilateral
 Pyelectasis
 Cerebral ventriculomegaly
 Thickened NT measurement (normal karyotype)
 Previous pregnacy with congenital heart (cardiac)
 defect; normal fetal ECHO
 Previous cesarean section
 Previous cervical surgery (colposcopy)
 Poor obstetric history: Previous preterm delivery
 Assess Fetal Growth / Estimated Fetal Weight
Fetal Evaluation

 Fetal Heart Rate:  131                          bpm
 Cardiac Activity:  Observed
 Presentation:      Frank breech
 Placenta:          Anterior, above cervical os
 P. Cord            Previously Visualized
 Insertion:

 Amniotic Fluid
 AFI FV:      Subjectively within normal limits
                                             Larg Pckt:     6.9  cm
Biometry

 BPD:     65.7  mm     G. Age:  26w 4d                CI:         76.8   70 - 86
 OFD:     85.6  mm                                    FL/HC:      19.8   18.6 -

 HC:     243.4  mm     G. Age:  26w 3d       21  %    HC/AC:      1.04   1.05 -

 AC:     234.7  mm     G. Age:  27w 5d       77  %    FL/BPD:     73.4   71 - 87
 FL:      48.2  mm     G. Age:  26w 1d       24  %    FL/AC:      20.5   20 - 24
 HUM:       44  mm     G. Age:  26w 1d       37  %
 CER:     30.3  mm     G. Age:  26w 5d       52  %

 Est. FW:    3430  gm      2 lb 4 oz     63  %
Gestational Age

 LMP:           26w 4d        Date:  10/16/11                 EDD:   07/22/12
 U/S Today:     26w 5d                                        EDD:   07/21/12
 Best:          26w 4d     Det. By:  LMP  (10/16/11)          EDD:   07/22/12
Anatomy

 Cranium:           Appears normal      Aortic Arch:       Appears normal
 Fetal Cavum:       Appears normal      Ductal Arch:       Appears normal
 Ventricles:        Rt                  Diaphragm:         Previously seen
                    Ventriculomega
                    Klever, 11 mm
 Choroid Plexus:    Previously seen     Stomach:           Appears
                                                           normal, left
                                                           sided
 Cerebellum:        Appears normal      Abdomen:           Appears normal
 Posterior Fossa:   Appears normal      Abdominal Wall:    Previously seen
 Nuchal Fold:       Not applicable      Cord Vessels:      Previously seen
                    (>20 wks GA)
 Face:              Unilateral cleft    Kidneys:           Appear normal
                    lip + / - palate;
                    profile NL
 Heart:             Appears normal      Bladder:           Appears normal
                    (4 chamber &
                    axis)
 RVOT:              Appears normal      Spine:             Previously seen
 LVOT:              Appears normal      Limbs:             Previously seen

 Other:     Fetus appears to be a male. Heels and 5th digit
            previously seen.
Targeted Anatomy

 Fetal Central Nervous System
 Cisterna Magna:
Cervix Uterus Adnexa

 Cervical Length:    3.1      cm

 Cervix:       Normal appearance by transabdominal scan.
Impression

 IUP at 26+4 weeks
 Unilateral cleft and palate; mild, unilateral ventriculomegaly
 All other interval fetal anatomy was seen and appeared
 normal; pyelectasis resolved
 Normal amniotic fluid volume
 Appropriate interval growth with EFW at the 63rd %tile

 Ms. Max Fernando would like to meet with a plastic surgeon
 later in the pregnancy.
Recommendations

 Follow-up ultrasound for growth in 4 weeks

## 2013-03-06 ENCOUNTER — Emergency Department (HOSPITAL_COMMUNITY): Payer: Medicaid Other

## 2013-03-06 ENCOUNTER — Emergency Department (HOSPITAL_COMMUNITY)
Admission: EM | Admit: 2013-03-06 | Discharge: 2013-03-06 | Disposition: A | Discharge status: Home / Self Care | Payer: Medicaid Other | Attending: Emergency Medicine | Admitting: Emergency Medicine

## 2013-03-06 ENCOUNTER — Encounter (HOSPITAL_COMMUNITY): Payer: Self-pay | Admitting: Emergency Medicine

## 2013-03-06 DIAGNOSIS — Z91040 Latex allergy status: Secondary | ICD-10-CM | POA: Insufficient documentation

## 2013-03-06 DIAGNOSIS — Z79899 Other long term (current) drug therapy: Secondary | ICD-10-CM | POA: Insufficient documentation

## 2013-03-06 DIAGNOSIS — F410 Panic disorder [episodic paroxysmal anxiety] without agoraphobia: Secondary | ICD-10-CM | POA: Insufficient documentation

## 2013-03-06 DIAGNOSIS — Z8679 Personal history of other diseases of the circulatory system: Secondary | ICD-10-CM | POA: Insufficient documentation

## 2013-03-06 DIAGNOSIS — F17200 Nicotine dependence, unspecified, uncomplicated: Secondary | ICD-10-CM | POA: Insufficient documentation

## 2013-03-06 DIAGNOSIS — Z3490 Encounter for supervision of normal pregnancy, unspecified, unspecified trimester: Secondary | ICD-10-CM

## 2013-03-06 DIAGNOSIS — Z3201 Encounter for pregnancy test, result positive: Secondary | ICD-10-CM | POA: Insufficient documentation

## 2013-03-06 DIAGNOSIS — M4120 Other idiopathic scoliosis, site unspecified: Secondary | ICD-10-CM | POA: Insufficient documentation

## 2013-03-06 DIAGNOSIS — Z8742 Personal history of other diseases of the female genital tract: Secondary | ICD-10-CM | POA: Insufficient documentation

## 2013-03-06 DIAGNOSIS — F4310 Post-traumatic stress disorder, unspecified: Secondary | ICD-10-CM | POA: Insufficient documentation

## 2013-03-06 DIAGNOSIS — O200 Threatened abortion: Secondary | ICD-10-CM

## 2013-03-06 DIAGNOSIS — R112 Nausea with vomiting, unspecified: Secondary | ICD-10-CM

## 2013-03-06 LAB — URINALYSIS, ROUTINE W REFLEX MICROSCOPIC
Bilirubin Urine: NEGATIVE
Glucose, UA: NEGATIVE mg/dL
Ketones, ur: 15 mg/dL — AB
Leukocytes, UA: NEGATIVE
Nitrite: NEGATIVE
Specific Gravity, Urine: 1.03 (ref 1.005–1.030)
pH: 6.5 (ref 5.0–8.0)

## 2013-03-06 LAB — URINE MICROSCOPIC-ADD ON

## 2013-03-06 LAB — CBC WITH DIFFERENTIAL/PLATELET
Basophils Relative: 0 % (ref 0–1)
HCT: 37.3 % (ref 36.0–46.0)
Hemoglobin: 12.7 g/dL (ref 12.0–15.0)
Lymphocytes Relative: 29 % (ref 12–46)
Lymphs Abs: 2.2 10*3/uL (ref 0.7–4.0)
MCHC: 34 g/dL (ref 30.0–36.0)
Monocytes Absolute: 0.7 10*3/uL (ref 0.1–1.0)
Monocytes Relative: 9 % (ref 3–12)
Neutro Abs: 4.8 10*3/uL (ref 1.7–7.7)
Neutrophils Relative %: 61 % (ref 43–77)
RBC: 4.06 MIL/uL (ref 3.87–5.11)

## 2013-03-06 LAB — POCT PREGNANCY, URINE: Preg Test, Ur: POSITIVE — AB

## 2013-03-06 LAB — BASIC METABOLIC PANEL
BUN: 10 mg/dL (ref 6–23)
CO2: 26 mEq/L (ref 19–32)
Chloride: 100 mEq/L (ref 96–112)
Creatinine, Ser: 0.57 mg/dL (ref 0.50–1.10)
GFR calc Af Amer: >90 mL/min (ref >90)
Glucose, Bld: 78 mg/dL (ref 70–99)
Potassium: 4.3 mEq/L (ref 3.5–5.1)

## 2013-03-06 LAB — WET PREP, GENITAL: Clue Cells Wet Prep HPF POC: NONE SEEN

## 2013-03-06 MED ORDER — ONDANSETRON 4 MG PO TBDP
8.0000 mg | ORAL_TABLET | Freq: Once | ORAL | Status: AC
  Administered 2013-03-06: 8 mg via ORAL
  Filled 2013-03-06: qty 2

## 2013-03-06 MED ORDER — TRAMADOL HCL 50 MG PO TABS
50.0000 mg | ORAL_TABLET | Freq: Once | ORAL | Status: AC
  Administered 2013-03-06: 50 mg via ORAL
  Filled 2013-03-06: qty 1

## 2013-03-06 MED ORDER — DOXYLAMINE SUCCINATE (SLEEP) 25 MG PO TABS: 25.0000 mg | ORAL_TABLET | Freq: Every evening | ORAL | Status: DC | PRN

## 2013-03-06 MED ORDER — ONDANSETRON 4 MG PO TBDP: 4.0000 mg | ORAL_TABLET | Freq: Three times a day (TID) | ORAL | Status: DC | PRN

## 2013-03-06 MED ORDER — TRAMADOL HCL 50 MG PO TABS: 50.0000 mg | ORAL_TABLET | Freq: Four times a day (QID) | ORAL | Status: DC | PRN

## 2013-03-06 NOTE — ED Notes (Signed)
Patient transported to Ultrasound 

## 2013-03-06 NOTE — ED Provider Notes (Signed)
History     CSN: [id number]  Arrival date & time 03/06/13  1215   First MD Initiated Contact with Patient 03/06/13 1227      No chief complaint on file.   (Consider location/radiation/quality/duration/timing/severity/associated sxs/prior treatment) HPI Comments: Patient is a 32 year old female who is s/p C-section 8 months ago who presents with lower abdominal pain. The pain is located in her central lower abdomen and does not radiate. The pain is described as cramping and severe. The pain started gradually and progressively worsened since the onset. No alleviating/aggravating factors. The patient has tried nothing for symptoms without relief. Associated symptoms include vaginal bleeding and a thick brown discharge as well as NVD. Patient denies fever, headache, chest pain, SOB, dysuria, constipation. LMP 2 months ago.    Past Medical History  Diagnosis Date  . HPV in female   . Migraines   . HPV in female   . Abnormal Pap smear   . Termination of pregnancy     x 1  . PTSD (post-traumatic stress disorder)     No meds  . Borderline personality disorder     No meds  . Panic anxiety syndrome     no meds  . Heartburn in pregnancy   . Scoliosis of lumbar spine     Past Surgical History  Procedure Laterality Date  . Cesarean section      x 2  . Colposcopy    . Wisdom tooth extraction    . Dilation and curettage of uterus    . Cesarean section  07/21/2012    Procedure: CESAREAN SECTION;  Surgeon: Kathreen Cosier, MD;  Location: [facility];  Service: Obstetrics;  Laterality: N/A;  Repeat Cesarean Section Delivery Boy @ [phone], Apgars 9/9    Family History  Problem Relation Age of Onset  . Cancer Mother   . Heart disease Mother   . Anesthesia problems Neg Hx     History  Substance Use Topics  . Smoking status: Current Every Day Smoker -- 0.50 packs/day for 13 years    Types: Cigarettes  . Smokeless tobacco: Never Used  . Alcohol Use: No    OB History   Grav Para Term  Preterm Abortions TAB SAB Ect Mult Living   4 3 2 1 1 1  0 0 0 3      Review of Systems  Gastrointestinal: Positive for nausea, vomiting and abdominal pain.  All other systems reviewed and are negative.    Allergies  Banana; Latex; Poison ivy extract; and Tomato  Home Medications   Current Outpatient Rx  Name  Route  Sig  Dispense  Refill  . FLUoxetine (PROZAC) 10 MG tablet   Oral   Take 10 mg by mouth daily.         . traZODone (DESYREL) 50 MG tablet   Oral   Take 25 mg by mouth at bedtime.           BP 115/62  Pulse 82  Temp(Src) 98.4 F (36.9 C)  Resp 16  SpO2 99%  Physical Exam  Nursing note and vitals reviewed. Constitutional: She is oriented to person, place, and time. She appears well-developed and well-nourished. No distress.  HENT:  Head: Normocephalic and atraumatic.  Eyes: Conjunctivae are normal.  Neck: Normal range of motion.  Cardiovascular: Normal rate and regular rhythm.  Exam reveals no gallop and no friction rub.   No murmur heard. Pulmonary/Chest: Effort normal and breath sounds normal. She has no wheezes.  She has no rales. She exhibits no tenderness.  Abdominal: Soft. She exhibits no distension. There is tenderness. There is no rebound and no guarding.  Lower abdominal tenderness to palpation. Well-healed C-section scar   Genitourinary:  Some blood noted in vagina. Cervical os closed. No CMT. Central tenderness to palpation. No abnormal masses or adnexal tenderness palpated.   Musculoskeletal: Normal range of motion.  Neurological: She is alert and oriented to person, place, and time. Coordination normal.  Speech is goal-oriented. Moves limbs without ataxia.   Skin: Skin is warm and dry.  Psychiatric: She has a normal mood and affect. Her behavior is normal.    ED Course  Procedures (including critical care time)  Labs Reviewed  WET PREP, GENITAL - Abnormal; Notable for the following:    WBC, Wet Prep HPF POC FEW (*)    All other  components within normal limits  URINALYSIS, ROUTINE W REFLEX MICROSCOPIC - Abnormal; Notable for the following:    APPearance CLOUDY (*)    Hgb urine dipstick LARGE (*)    Ketones, ur 15 (*)    All other components within normal limits  URINE MICROSCOPIC-ADD ON - Abnormal; Notable for the following:    Squamous Epithelial / LPF MANY (*)    Bacteria, UA FEW (*)    All other components within normal limits  POCT PREGNANCY, URINE - Abnormal; Notable for the following:    Preg Test, Ur POSITIVE (*)    All other components within normal limits  GC/CHLAMYDIA PROBE AMP  CBC WITH DIFFERENTIAL  BASIC METABOLIC PANEL   US Ob Comp Less 14 Wks  03/06/2013   *RADIOLOGY REPORT*  Clinical Data: Lower abdominal pain.  Early pregnancy  OBSTETRIC <14 WK [place] AND TRANSVAGINAL OB US  Technique:  Both transabdominal and transvaginal ultrasound examinations were performed for complete evaluation of the gestation as well as the maternal uterus, adnexal regions, and pelvic cul-de-sac.  Transvaginal technique was performed to assess early pregnancy.  Comparison:  None.  Intrauterine gestational sac:  Visualized/normal in shape. Yolk sac: Seen Embryo: Seen Cardiac Activity: Seen Heart Rate: 139 bpm  CRL: 14.2  mm  7 w  5 d             [place] EDC: 10/18/2013  Maternal uterus/adnexae: The right ovary measures 2.5 x 1.4 x 2.2 cm and has a normal appearance. The left ovary measures 2.9 x 2.5 x 2.7 cm and contains a corpus luteum.  A small subacute subchorionic hemorrhage is seen. No pelvic fluid or separate adnexal masses are seen.  IMPRESSION: Single living intrauterine pregnancy demonstrating an estimated gestational age by crown-rump length of 7 weeks 5 days with corresponding EDC of 10/18/2013.  This is 1 week 1 day ahead of expected estimated gestational age by LMP of 6 weeks 4 days and corresponds with best dating by today's exam.  Normal ovaries.   Original Report Authenticated By: Rhodia Albright, M.D.     1. Pregnancy    2. Threatened abortion   3. Nausea and vomiting       MDM  1:11 PM Pelvic done. Patient will have pelvic US. Patient will have tramadol for pain and zofran for nausea. Labs and urinalysis pending.   Patient signed out to Dr. Rulon Abide for disposition.      Emilia Beck, PA-C 03/07/13 1307

## 2013-03-06 NOTE — ED Notes (Signed)
Rt lower abd pain and had a c sect  ( 8 months ago ) scar keeps bursting open has had n/v/d  And  She missed her period last month but just started a yucky thick brown stuff vag d/c now ( she states DSS has her baby)

## 2013-03-07 LAB — GC/CHLAMYDIA PROBE AMP
CT Probe RNA: NEGATIVE
GC Probe RNA: NEGATIVE

## 2013-03-07 NOTE — ED Provider Notes (Signed)
Patient is complaining of abdominal pain - patient is pregnant, 6 weeks and 4 days by last menstrual period, ultrasound shows a single live intrauterine pregnancy.  Patient is given Tylenol for pain, Zofran for nausea referred to [facility] for prenatal care.  Jones Skene, MD 03/07/13 1610

## 2013-03-07 NOTE — ED Provider Notes (Signed)
Medical screening examination/treatment/procedure(s) were performed by non-physician practitioner and as supervising physician I was immediately available for consultation/collaboration.   Duong Haydel H Ilisa Hayworth, MD 03/07/13 1640 

## 2013-03-15 ENCOUNTER — Encounter (HOSPITAL_COMMUNITY): Payer: Self-pay | Admitting: *Deleted

## 2013-03-15 ENCOUNTER — Emergency Department (HOSPITAL_COMMUNITY): Payer: Medicaid Other

## 2013-03-15 ENCOUNTER — Emergency Department (HOSPITAL_COMMUNITY)
Admission: EM | Admit: 2013-03-15 | Discharge: 2013-03-16 | Disposition: A | Discharge status: Home / Self Care | Payer: Medicaid Other | Attending: Emergency Medicine | Admitting: Emergency Medicine

## 2013-03-15 DIAGNOSIS — R109 Unspecified abdominal pain: Secondary | ICD-10-CM | POA: Insufficient documentation

## 2013-03-15 DIAGNOSIS — O209 Hemorrhage in early pregnancy, unspecified: Secondary | ICD-10-CM | POA: Insufficient documentation

## 2013-03-15 DIAGNOSIS — N898 Other specified noninflammatory disorders of vagina: Secondary | ICD-10-CM | POA: Insufficient documentation

## 2013-03-15 DIAGNOSIS — Z8619 Personal history of other infectious and parasitic diseases: Secondary | ICD-10-CM | POA: Insufficient documentation

## 2013-03-15 DIAGNOSIS — Z79899 Other long term (current) drug therapy: Secondary | ICD-10-CM | POA: Insufficient documentation

## 2013-03-15 DIAGNOSIS — O2390 Unspecified genitourinary tract infection in pregnancy, unspecified trimester: Secondary | ICD-10-CM | POA: Insufficient documentation

## 2013-03-15 DIAGNOSIS — N949 Unspecified condition associated with female genital organs and menstrual cycle: Secondary | ICD-10-CM | POA: Insufficient documentation

## 2013-03-15 DIAGNOSIS — N390 Urinary tract infection, site not specified: Secondary | ICD-10-CM | POA: Insufficient documentation

## 2013-03-15 DIAGNOSIS — Z3201 Encounter for pregnancy test, result positive: Secondary | ICD-10-CM | POA: Insufficient documentation

## 2013-03-15 DIAGNOSIS — Z8739 Personal history of other diseases of the musculoskeletal system and connective tissue: Secondary | ICD-10-CM | POA: Insufficient documentation

## 2013-03-15 DIAGNOSIS — Z8742 Personal history of other diseases of the female genital tract: Secondary | ICD-10-CM | POA: Insufficient documentation

## 2013-03-15 DIAGNOSIS — Z8679 Personal history of other diseases of the circulatory system: Secondary | ICD-10-CM | POA: Insufficient documentation

## 2013-03-15 DIAGNOSIS — O99330 Smoking (tobacco) complicating pregnancy, unspecified trimester: Secondary | ICD-10-CM | POA: Insufficient documentation

## 2013-03-15 DIAGNOSIS — Z8659 Personal history of other mental and behavioral disorders: Secondary | ICD-10-CM | POA: Insufficient documentation

## 2013-03-15 DIAGNOSIS — Z3490 Encounter for supervision of normal pregnancy, unspecified, unspecified trimester: Secondary | ICD-10-CM

## 2013-03-15 DIAGNOSIS — O9989 Other specified diseases and conditions complicating pregnancy, childbirth and the puerperium: Secondary | ICD-10-CM | POA: Insufficient documentation

## 2013-03-15 LAB — URINALYSIS, ROUTINE W REFLEX MICROSCOPIC
Nitrite: NEGATIVE
Protein, ur: NEGATIVE mg/dL
Specific Gravity, Urine: 1.029 (ref 1.005–1.030)
Urobilinogen, UA: 0.2 mg/dL (ref 0.0–1.0)

## 2013-03-15 LAB — CBC WITH DIFFERENTIAL/PLATELET
Basophils Relative: 0 % (ref 0–1)
Eosinophils Absolute: 0.1 10*3/uL (ref 0.0–0.7)
HCT: 36.4 % (ref 36.0–46.0)
Hemoglobin: 12.7 g/dL (ref 12.0–15.0)
Lymphs Abs: 2.9 10*3/uL (ref 0.7–4.0)
MCH: 31.9 pg (ref 26.0–34.0)
MCHC: 34.9 g/dL (ref 30.0–36.0)
Monocytes Absolute: 0.7 10*3/uL (ref 0.1–1.0)
Monocytes Relative: 7 % (ref 3–12)
Neutrophils Relative %: 62 % (ref 43–77)
RBC: 3.98 MIL/uL (ref 3.87–5.11)

## 2013-03-15 LAB — COMPREHENSIVE METABOLIC PANEL
ALT: 13 U/L (ref 0–35)
AST: 15 U/L (ref 0–37)
CO2: 21 mEq/L (ref 19–32)
Calcium: 9.3 mg/dL (ref 8.4–10.5)
Chloride: 100 mEq/L (ref 96–112)
Creatinine, Ser: 0.55 mg/dL (ref 0.50–1.10)
GFR calc Af Amer: >90 mL/min (ref >90)
GFR calc non Af Amer: >90 mL/min (ref >90)
Glucose, Bld: 95 mg/dL (ref 70–99)
Total Bilirubin: 0.2 mg/dL — ABNORMAL LOW (ref 0.3–1.2)

## 2013-03-15 LAB — URINE MICROSCOPIC-ADD ON

## 2013-03-15 LAB — WET PREP, GENITAL: Clue Cells Wet Prep HPF POC: NONE SEEN

## 2013-03-15 LAB — POCT PREGNANCY, URINE: Preg Test, Ur: POSITIVE — AB

## 2013-03-15 LAB — HCG, SERUM, QUALITATIVE: Preg, Serum: POSITIVE — AB

## 2013-03-15 MED ORDER — CEPHALEXIN 250 MG PO CAPS: 250.0000 mg | ORAL_CAPSULE | Freq: Four times a day (QID) | ORAL | Status: DC

## 2013-03-15 NOTE — ED Provider Notes (Signed)
History     CSN: [id number]  Arrival date & time 03/15/13  2024   First MD Initiated Contact with Patient 03/15/13 2109      Chief Complaint  Patient presents with  . Threatened Miscarriage    (Consider location/radiation/quality/duration/timing/severity/associated sxs/prior treatment) HPI Comments: 32 y.o. female who was diagnosed w/ an IUP approx 11 days ago by U/S. At that time pt was dated at 6 weeks, and she was having spotting (per patient) and she was told it might be a threatened abortion. Pt states that since then she has had brown discharge from vagina, she states it has been foul smelling and has worsened over the past day. No fevers associated w/ this. She states that today, she has lower pelvic pain, prior she states she was having cramps but not having pain.  Patient is a 32 y.o. female presenting with general illness. The history is provided by the patient.  Illness Location:  Lower pelvis area  Severity:  Mild Onset quality:  Gradual Timing:  Constant Progression:  Worsening Chronicity:  New Associated symptoms: abdominal pain   Associated symptoms: no chest pain, no congestion, no cough, no diarrhea, no fatigue, no fever, no headaches, no rash, no vomiting and no wheezing     Past Medical History  Diagnosis Date  . HPV in female   . Migraines   . HPV in female   . Abnormal Pap smear   . Termination of pregnancy     x 1  . PTSD (post-traumatic stress disorder)     No meds  . Borderline personality disorder     No meds  . Panic anxiety syndrome     no meds  . Heartburn in pregnancy   . Scoliosis of lumbar spine     Past Surgical History  Procedure Laterality Date  . Cesarean section      x 2  . Colposcopy    . Wisdom tooth extraction    . Dilation and curettage of uterus    . Cesarean section  07/21/2012    Procedure: CESAREAN SECTION;  Surgeon: Kathreen Cosier, MD;  Location: [facility];  Service: Obstetrics;  Laterality: N/A;  Repeat Cesarean  Section Delivery Boy @ [phone], Apgars 9/9    Family History  Problem Relation Age of Onset  . Cancer Mother   . Heart disease Mother   . Anesthesia problems Neg Hx     History  Substance Use Topics  . Smoking status: Current Every Day Smoker -- 0.50 packs/day for 13 years    Types: Cigarettes  . Smokeless tobacco: Never Used  . Alcohol Use: No    OB History   Grav Para Term Preterm Abortions TAB SAB Ect Mult Living   4 3 2 1 1 1  0 0 0 3      Review of Systems  Constitutional: Negative for fever, chills and fatigue.  HENT: Negative for congestion, facial swelling, drooling, neck pain and dental problem.   Eyes: Negative for pain, discharge and itching.  Respiratory: Negative for cough, choking, wheezing and stridor.   Cardiovascular: Negative for chest pain.  Gastrointestinal: Positive for abdominal pain. Negative for vomiting and diarrhea.  Endocrine: Negative for cold intolerance and heat intolerance.  Genitourinary: Positive for vaginal bleeding, vaginal discharge and pelvic pain. Negative for difficulty urinating and vaginal pain.  Skin: Negative for pallor and rash.  Neurological: Negative for dizziness, light-headedness and headaches.  Psychiatric/Behavioral: Negative for behavioral problems and agitation.    Allergies  Banana; Latex; Poison ivy extract; and Tomato  Home Medications   Current Outpatient Rx  Name  Route  Sig  Dispense  Refill  . FLUoxetine (PROZAC) 10 MG tablet   Oral   Take 10 mg by mouth daily.         . ondansetron (ZOFRAN ODT) 4 MG disintegrating tablet   Oral   Take 1 tablet (4 mg total) by mouth every 8 (eight) hours as needed for nausea.   10 tablet   0   . traZODone (DESYREL) 50 MG tablet   Oral   Take 25 mg by mouth at bedtime.           BP 111/61  Pulse 79  Temp(Src) 97.4 F (36.3 C)  Resp 18  SpO2 98%  Physical Exam  Constitutional: She is oriented to person, place, and time. She appears well-developed. No  distress.  HENT:  Head: Normocephalic and atraumatic.  Eyes: Pupils are equal, round, and reactive to light. Right eye exhibits no discharge. Left eye exhibits no discharge.  Neck: Neck supple. No tracheal deviation present.  Cardiovascular: Normal rate.  Exam reveals no gallop and no friction rub.   Pulmonary/Chest: No stridor. No respiratory distress. She has no wheezes.  Abdominal: Soft. She exhibits no distension. Tenderness: minimal suprapubic ttp. There is no rebound.  Genitourinary:  Cervix is closed. No blood in vaginal vault. Has small amount of brown yellow discharge. No CMT.   Musculoskeletal: She exhibits no edema and no tenderness.  Neurological: She is alert and oriented to person, place, and time.  Skin: Skin is warm. She is not diaphoretic.    ED Course  Procedures (including critical care time)  Labs Reviewed  WET PREP, GENITAL - Abnormal; Notable for the following:    WBC, Wet Prep HPF POC FEW (*)    All other components within normal limits  COMPREHENSIVE METABOLIC PANEL - Abnormal; Notable for the following:    Sodium 134 (*)    Total Bilirubin 0.2 (*)    All other components within normal limits  URINALYSIS, ROUTINE W REFLEX MICROSCOPIC - Abnormal; Notable for the following:    APPearance CLOUDY (*)    Hgb urine dipstick MODERATE (*)    Bilirubin Urine SMALL (*)    All other components within normal limits  HCG, SERUM, QUALITATIVE - Abnormal; Notable for the following:    Preg, Serum POSITIVE (*)    All other components within normal limits  URINE MICROSCOPIC-ADD ON - Abnormal; Notable for the following:    Squamous Epithelial / LPF FEW (*)    Bacteria, UA FEW (*)    All other components within normal limits  POCT PREGNANCY, URINE - Abnormal; Notable for the following:    Preg Test, Ur POSITIVE (*)    All other components within normal limits  GC/CHLAMYDIA PROBE AMP  CBC WITH DIFFERENTIAL  ABO/RH  TYPE AND SCREEN   US Ob Comp Less 14 Wks  03/15/2013    *RADIOLOGY REPORT*  Clinical Data: Vaginal bleeding and pelvic pain.  OBSTETRIC <14 WK [place] AND TRANSVAGINAL OB US  Technique:  Both transabdominal and transvaginal ultrasound examinations were performed for complete evaluation of the gestation as well as the maternal uterus, adnexal regions, and pelvic cul-de-sac.  Transvaginal technique was performed to assess early pregnancy.  Comparison:  03/06/2013.  Intrauterine gestational sac:  Visualized/normal in shape. Yolk sac: Present Embryo: Present Cardiac Activity: Present Heart Rate: 168 bpm  MSD:  mm   w  d  CRL: 24.4  mm  9 w  1 d             [place] EDC: 10/17/2013.  Maternal uterus/adnexae: No subchorionic hemorrhage. Ovaries not visualized. No free pelvic fluid collections.  IMPRESSION:  1.  Single living intrauterine fetus estimated at 9 weeks and 1 day gestation based on  crown rump length.  Interval growth since prior ultrasound. 2.  Ovaries not visualized.  No free pelvic fluid collections.   Original Report Authenticated By: Rudie Meyer, M.D.   US Ob Transvaginal  03/15/2013   *RADIOLOGY REPORT*  Clinical Data: Vaginal bleeding and pelvic pain.  OBSTETRIC <14 WK [place] AND TRANSVAGINAL OB US  Technique:  Both transabdominal and transvaginal ultrasound examinations were performed for complete evaluation of the gestation as well as the maternal uterus, adnexal regions, and pelvic cul-de-sac.  Transvaginal technique was performed to assess early pregnancy.  Comparison:  03/06/2013.  Intrauterine gestational sac:  Visualized/normal in shape. Yolk sac: Present Embryo: Present Cardiac Activity: Present Heart Rate: 168 bpm  MSD:  mm   w  d CRL: 24.4  mm  9 w  1 d             [place] EDC: 10/17/2013.  Maternal uterus/adnexae: No subchorionic hemorrhage. Ovaries not visualized. No free pelvic fluid collections.  IMPRESSION:  1.  Single living intrauterine fetus estimated at 9 weeks and 1 day gestation based on  crown rump length.  Interval growth since prior ultrasound. 2.   Ovaries not visualized.  No free pelvic fluid collections.   Original Report Authenticated By: Rudie Meyer, M.D.       MDM  Possible UTI -- otherwise pregnancy looks okay on U/S. Pt's repeat abdominal exams are benign, minimal suprapubic ttp. abd exam is benign -- doubt she has appendicitis or other acute intra-abdominal pathology, especially in setting of no leukocytosis, no fevers. No blood in vaginal vault and cervix is closed. Do not suspect PID based on physical exam.   Will tx UTI w/ keflex, pt is given script. [facility] states [facility] will [facility], as she will need further evaluation for the cramping she is getting.    1. UTI (lower urinary tract infection)   2. Pregnancy            Aradia Estey Donette Larry, MD 03/16/13 [phone]

## 2013-03-15 NOTE — ED Notes (Signed)
Shift change, 1st contact with pt. Report received and will assume care of pt at this time.

## 2013-03-15 NOTE — ED Notes (Addendum)
Pt reports threatened miscarriage x 2 weeks ago.  States that she has been bleeding thick brown blood since 2 weeks ago.  Pt reports severe abdominal pain that started today. Pt does not have an OBGYN. Pt is [redacted] weeks pregnant

## 2013-03-15 NOTE — ED Notes (Signed)
Called x1. No answer.

## 2013-03-17 NOTE — ED Provider Notes (Signed)
I have personally seen and examined the patient.  I have discussed the plan of care with the resident.  I have reviewed the documentation on PMH/FH/Soc. History.  I have reviewed the documentation of the resident and agree.  Pt well appearing, abdomen soft and no focal tenderness on my eval, I doubt acute appendicitis at this time   Joya Gaskins, MD 03/17/13 [phone]

## 2013-04-26 ENCOUNTER — Encounter (HOSPITAL_COMMUNITY): Payer: Self-pay | Admitting: Emergency Medicine

## 2013-04-26 ENCOUNTER — Emergency Department (HOSPITAL_COMMUNITY)
Admission: EM | Admit: 2013-04-26 | Discharge: 2013-04-26 | Disposition: A | Discharge status: Home / Self Care | Payer: Medicaid Other | Attending: Emergency Medicine | Admitting: Emergency Medicine

## 2013-04-26 DIAGNOSIS — O9989 Other specified diseases and conditions complicating pregnancy, childbirth and the puerperium: Secondary | ICD-10-CM | POA: Insufficient documentation

## 2013-04-26 DIAGNOSIS — Z8739 Personal history of other diseases of the musculoskeletal system and connective tissue: Secondary | ICD-10-CM | POA: Insufficient documentation

## 2013-04-26 DIAGNOSIS — K219 Gastro-esophageal reflux disease without esophagitis: Secondary | ICD-10-CM | POA: Insufficient documentation

## 2013-04-26 DIAGNOSIS — R1310 Dysphagia, unspecified: Secondary | ICD-10-CM | POA: Insufficient documentation

## 2013-04-26 DIAGNOSIS — J029 Acute pharyngitis, unspecified: Secondary | ICD-10-CM | POA: Insufficient documentation

## 2013-04-26 DIAGNOSIS — Z91040 Latex allergy status: Secondary | ICD-10-CM | POA: Insufficient documentation

## 2013-04-26 DIAGNOSIS — Z8619 Personal history of other infectious and parasitic diseases: Secondary | ICD-10-CM | POA: Insufficient documentation

## 2013-04-26 DIAGNOSIS — F4310 Post-traumatic stress disorder, unspecified: Secondary | ICD-10-CM | POA: Insufficient documentation

## 2013-04-26 DIAGNOSIS — B3781 Candidal esophagitis: Secondary | ICD-10-CM | POA: Insufficient documentation

## 2013-04-26 DIAGNOSIS — Z8679 Personal history of other diseases of the circulatory system: Secondary | ICD-10-CM | POA: Insufficient documentation

## 2013-04-26 DIAGNOSIS — O99330 Smoking (tobacco) complicating pregnancy, unspecified trimester: Secondary | ICD-10-CM | POA: Insufficient documentation

## 2013-04-26 DIAGNOSIS — R509 Fever, unspecified: Secondary | ICD-10-CM | POA: Insufficient documentation

## 2013-04-26 DIAGNOSIS — F410 Panic disorder [episodic paroxysmal anxiety] without agoraphobia: Secondary | ICD-10-CM | POA: Insufficient documentation

## 2013-04-26 DIAGNOSIS — O99340 Other mental disorders complicating pregnancy, unspecified trimester: Secondary | ICD-10-CM | POA: Insufficient documentation

## 2013-04-26 DIAGNOSIS — Z79899 Other long term (current) drug therapy: Secondary | ICD-10-CM | POA: Insufficient documentation

## 2013-04-26 DIAGNOSIS — Z8719 Personal history of other diseases of the digestive system: Secondary | ICD-10-CM | POA: Insufficient documentation

## 2013-04-26 DIAGNOSIS — B370 Candidal stomatitis: Secondary | ICD-10-CM

## 2013-04-26 LAB — GLUCOSE, CAPILLARY: Glucose-Capillary: 64 mg/dL — ABNORMAL LOW (ref 70–99)

## 2013-04-26 MED ORDER — FLUCONAZOLE 100 MG PO TABS: 100.0000 mg | ORAL_TABLET | Freq: Every day | ORAL | Status: DC

## 2013-04-26 MED ORDER — FLUCONAZOLE 100 MG PO TABS
100.0000 mg | ORAL_TABLET | Freq: Once | ORAL | Status: AC
  Administered 2013-04-26: 100 mg via ORAL
  Filled 2013-04-26: qty 1

## 2013-04-26 NOTE — ED Notes (Signed)
Pts CBG was 64 when I checked it.12:50pm JG

## 2013-04-26 NOTE — ED Provider Notes (Signed)
History    CSN: [id number] Arrival date & time 04/26/13  1016  First MD Initiated Contact with Patient 04/26/13 1105     Chief Complaint  Patient presents with  . Gastrophageal Reflux   (Consider location/radiation/quality/duration/timing/severity/associated sxs/prior Treatment) HPI Comments: Patients at [redacted] weeks gestation presents with complaint of sore throat, painful swallowing, bumps on tongue and dysgeusia for the past 4 days. Patient had subjective fever last night. She has been using cough drops without relief. No nausea or vomiting. No ear pain or runny nose. No history of immunocompromising states or use of inhalers. No steroid use. Patient has history of GERD but states this feels different. Onset of symptoms gradual. Course is constant. Nothing makes symptoms better. She notes she was recently exposed to a friend who had strep throat and is afraid she has this.    The history is provided by the patient and medical records.   Past Medical History  Diagnosis Date  . HPV in female   . Migraines   . HPV in female   . Abnormal Pap smear   . Termination of pregnancy     x 1  . PTSD (post-traumatic stress disorder)     No meds  . Borderline personality disorder     No meds  . Panic anxiety syndrome     no meds  . Heartburn in pregnancy   . Scoliosis of lumbar spine    Past Surgical History  Procedure Laterality Date  . Cesarean section      x 2  . Colposcopy    . Wisdom tooth extraction    . Dilation and curettage of uterus    . Cesarean section  07/21/2012    Procedure: CESAREAN SECTION;  Surgeon: Kathreen Cosier, MD;  Location: [facility];  Service: Obstetrics;  Laterality: N/A;  Repeat Cesarean Section Delivery Boy @ [phone], Apgars 9/9   Family History  Problem Relation Age of Onset  . Cancer Mother   . Heart disease Mother   . Anesthesia problems Neg Hx    History  Substance Use Topics  . Smoking status: Current Every Day Smoker -- 0.50 packs/day for 13 years     Types: Cigarettes  . Smokeless tobacco: Never Used  . Alcohol Use: No   OB History   Grav Para Term Preterm Abortions TAB SAB Ect Mult Living   5 3 2 1 1 1  0 0 0 3     Review of Systems  Constitutional: Positive for fever (subjective). Negative for chills.  HENT: Positive for sore throat, mouth sores and trouble swallowing. Negative for rhinorrhea.   Eyes: Negative for redness.  Respiratory: Negative for cough and shortness of breath.   Cardiovascular: Negative for chest pain.  Gastrointestinal: Negative for nausea, vomiting, abdominal pain and diarrhea.  Genitourinary: Negative for dysuria.  Musculoskeletal: Negative for myalgias.  Skin: Negative for rash.  Neurological: Negative for headaches.    Allergies  Banana; Latex; Poison ivy extract; and Tomato  Home Medications   Current Outpatient Rx  Name  Route  Sig  Dispense  Refill  . cephALEXin (KEFLEX) 250 MG capsule   Oral   Take 1 capsule (250 mg total) by mouth 4 (four) times daily.   28 capsule   0   . FLUoxetine (PROZAC) 10 MG tablet   Oral   Take 10 mg by mouth daily.         . ondansetron (ZOFRAN ODT) 4 MG disintegrating tablet   Oral  Take 1 tablet (4 mg total) by mouth every 8 (eight) hours as needed for nausea.   10 tablet   0   . traZODone (DESYREL) 50 MG tablet   Oral   Take 25 mg by mouth at bedtime.          BP 115/60  Pulse 89  Temp(Src) 98.7 F (37.1 C) (Oral)  Resp 18  SpO2 99%  LMP 01/19/2013 Physical Exam  Nursing note and vitals reviewed. Constitutional: She appears well-developed and well-nourished.  HENT:  Head: Normocephalic and atraumatic.  Right Ear: External ear normal.  Left Ear: External ear normal.  Nose: Nose normal.  Mouth/Throat: Mucous membranes are not dry. No edematous. Posterior oropharyngeal erythema present. No oropharyngeal exudate, posterior oropharyngeal edema or tonsillar abscesses.  White-yellow coating on tongue   Eyes: Conjunctivae are normal.  Right eye exhibits no discharge. Left eye exhibits no discharge.  Neck: Normal range of motion. Neck supple.  Mild tenderness  Cardiovascular: Normal rate, regular rhythm and normal heart sounds.   Pulmonary/Chest: Effort normal and breath sounds normal.  Abdominal: Soft. There is no tenderness.  Lymphadenopathy:    She has no cervical adenopathy.  Neurological: She is alert.  Skin: Skin is warm and dry.  Psychiatric: She has a normal mood and affect.    ED Course  Procedures (including critical care time) Labs Reviewed  GLUCOSE, CAPILLARY - Abnormal; Notable for the following:    Glucose-Capillary 64 (*)    All other components within normal limits  RAPID STREP SCREEN  CULTURE, GROUP A STREP   No results found. 1. Oral candidiasis   2. Esophageal candidiasis     11:28 AM Patient seen and examined. Work-up initiated. Medications ordered.   Vital signs reviewed and are as follows: Filed Vitals:   04/26/13 1020  BP: 115/60  Pulse: 89  Temp: 98.7 F (37.1 C)  Resp: 18   1:19 PM I spoke with Antionette Char who is the patient's gynecologist who states to give patient typical course of diflucan. Informed her of patient presentation and that she is [redacted] weeks along. Discussed pregnancy category for this medication. She states this is okay. She will f/u with her next week.   Pt informed of all results. Discussed use of diflucan and informed of discussion with her OB/GYN with patient. Patient eating in the room.   Patient urged to return with worsening symptoms or other concerns. Patient verbalized understanding and agrees with plan.    MDM  Exam consistent with oral candidiasis. Esophageal suspected due to throat/upper chest pain with swallowing. Discussed use of diflucan with OB/GYN who states use of this medication is okay. She has follow-up next week. 10 days of medication given -- will allow OB/GYN to decide if prolonged therapy needed at follow-up.   Renne Crigler,  PA-C 04/26/13 1338

## 2013-04-26 NOTE — ED Notes (Signed)
Pt states everything she eats feels like acid on her tongue. Pt reports she is 4 months preg. Reports cough, bumps on her tongue and states "I know I had a fever last night". Pt alert, oriented x4, skin warm, dry, NAD at present.

## 2013-04-26 NOTE — ED Notes (Signed)
Lunch meal given. 

## 2013-04-26 NOTE — ED Notes (Addendum)
Pt presents with difficulty swallowing x 7 days. Pt describes "like a burning" in throat, reports "food is getting hung in there".  Pt reports exposure to strep last week.  Pt is able to handle secretions, does not have muffled voice.  Pt is 3 months pregnant.

## 2013-04-27 NOTE — ED Provider Notes (Signed)
Medical screening examination/treatment/procedure(s) were performed by non-physician practitioner and as supervising physician I was immediately available for consultation/collaboration.  Maddalena Linarez R. Gizella [place], MD 04/27/13 1651 

## 2013-04-29 LAB — CULTURE, GROUP A STREP

## 2013-05-03 ENCOUNTER — Encounter: Payer: Self-pay | Admitting: Obstetrics & Gynecology

## 2013-05-16 ENCOUNTER — Encounter: Payer: Self-pay | Admitting: Obstetrics & Gynecology

## 2013-05-17 ENCOUNTER — Encounter: Payer: Medicaid Other | Admitting: Obstetrics & Gynecology

## 2013-05-21 ENCOUNTER — Encounter: Payer: Self-pay | Admitting: Obstetrics & Gynecology

## 2013-05-21 ENCOUNTER — Ambulatory Visit (INDEPENDENT_AMBULATORY_CARE_PROVIDER_SITE_OTHER): Payer: Medicaid Other | Admitting: Obstetrics & Gynecology

## 2013-05-21 VITALS — BP 118/72 | Temp 98.3°F | Wt 215.0 lb

## 2013-05-21 DIAGNOSIS — Z3201 Encounter for pregnancy test, result positive: Secondary | ICD-10-CM

## 2013-05-21 DIAGNOSIS — Z3480 Encounter for supervision of other normal pregnancy, unspecified trimester: Secondary | ICD-10-CM | POA: Insufficient documentation

## 2013-05-21 DIAGNOSIS — Z98891 History of uterine scar from previous surgery: Secondary | ICD-10-CM

## 2013-05-21 DIAGNOSIS — O99330 Smoking (tobacco) complicating pregnancy, unspecified trimester: Secondary | ICD-10-CM | POA: Insufficient documentation

## 2013-05-21 DIAGNOSIS — O99332 Smoking (tobacco) complicating pregnancy, second trimester: Secondary | ICD-10-CM

## 2013-05-21 DIAGNOSIS — O99334 Smoking (tobacco) complicating childbirth: Secondary | ICD-10-CM

## 2013-05-21 DIAGNOSIS — Z3482 Encounter for supervision of other normal pregnancy, second trimester: Secondary | ICD-10-CM

## 2013-05-21 LAB — POCT URINALYSIS DIPSTICK
Bilirubin, UA: NEGATIVE
Blood, UA: NEGATIVE
Nitrite, UA: NEGATIVE
Urobilinogen, UA: NEGATIVE
pH, UA: 5

## 2013-05-21 NOTE — Progress Notes (Signed)
Subjective:    Suzanne Klein is being seen today for her first obstetrical visit.  This is a planned pregnancy. She is at [redacted]w[redacted]d gestation. Her obstetrical history is significant for smoker. Relationship with FOB: spouse, living together. Patient does intend to breast feed. Pregnancy history fully reviewed.  Menstrual History: OB History   Grav Para Term Preterm Abortions TAB SAB Ect Mult Living   5 3 2 1 1 1  0 0 0 3      Last pap smear 2013 Results were normal Menarche age: 61 Regular  Patient's last menstrual period was 01/19/2013.    The following portions of the patient's history were reviewed and updated as appropriate: allergies, current medications, past family history, past medical history, past social history, past surgical history and problem list.  Review of Systems Pertinent items are noted in HPI.    Objective:   General Appearance:    Alert, cooperative, no distress, appears stated age  Head:    Normocephalic, without obvious abnormality, atraumatic  Eyes:    PERRL, conjunctiva/corneas clear, EOM's intact, fundi    benign, both eyes  Ears:    Normal TM's and external ear canals, both ears  Nose:   Nares normal, septum midline, mucosa normal, no drainage    or sinus tenderness  Throat:   Lips, mucosa, and tongue normal; teeth and gums normal  Neck:   Supple, symmetrical, trachea midline, no adenopathy;    thyroid:  no enlargement/tenderness/nodules; no carotid   bruit or JVD  Back:     Symmetric, no curvature, ROM normal, no CVA tenderness  Lungs:     Clear to auscultation bilaterally, respirations unlabored  Chest Wall:    No tenderness or deformity   Heart:    Regular rate and rhythm, S1 and S2 normal, no murmur, rub   or gallop  Breast Exam:    No tenderness, masses, or nipple abnormality  Abdomen:     Soft, non-tender, bowel sounds active all four quadrants,    no masses, no organomegaly  Genitalia:    Normal female without lesion, discharge or tenderness   Extremities:   Extremities normal, atraumatic, no cyanosis or edema  Pulses:   2+ and symmetric all extremities  Skin:   Skin color, texture, turgor normal, no rashes or lesions  Lymph nodes:   Cervical, supraclavicular, and axillary nodes normal  Neurologic:   CNII-XII intact, normal strength, sensation and reflexes    throughout    Assessment:    Pregnancy at [redacted]w[redacted]d weeks    Plan:    Initial labs drawn. Prenatal vitamins.  Counseling provided regarding continued use of seat belts, cessation of alcohol consumption, smoking or use of illicit drugs; infection precautions i.e., influenza/TDAP immunizations, toxoplasmosis,CMV, parvovirus, listeria and varicella; workplace safety, exercise during pregnancy; routine dental care, safe medications, sexual activity, hot tubs, saunas, pools, travel, caffeine use, fish and methlymercury, potential toxins, hair treatments, varicose veins Weight gain recommendations reviewed: underweight/BMI< 18.5--> gain 28 - 40 lbs; normal weight/BMI 18.5 - 24.9--> gain 25 - 35 lbs; overweight/BMI 25 - 29.9--> gain 15 - 25 lbs; obese/BMI >30->gain  11 - 20 lbs Problem list reviewed and updated. AFP3 discussed: ordered. Role of ultrasound in pregnancy discussed; fetal survey: requested. Amniocentesis discussed: not indicated. Follow up in 6 weeks. 50% of 20 min visit spent on counseling and coordination of care.

## 2013-05-21 NOTE — Patient Instructions (Signed)
Pregnancy and Smoking Smoking during pregnancy is very unhealthy for the mother and the developing fetus. The addictive drug in cigarettes (nicotine), carbon monoxide, and many other poisons are inhaled from a cigarette and are carried through your bloodstream to your fetus. Cigarette smoke contains more than 2,500 chemicals. It is not known which of these chemicals are harmful to the developing fetus. However, both nicotine and carbon monoxide play a role in causing health problems in pregnancy. Effects on the fetus of smoking during pregnancy:  Decrease in blood flow and oxygen to the uterus, placenta, and your fetus.  Increased heart rate of the fetus.  Slowing of your fetus's growth in the uterus (intrauterine growth retardation).  Placental problems. Placenta may partially cover or completely cover the opening to the cervix (placenta previa), or the placenta may partially or completely separate from the uterus (placental abruption).  Increase risk of pregnancy outside of the uterus (tubal pregnancy).  Premature rupture membranes, causing the sac that holds the fetus to break too early, resulting in premature birth and increased health risks to the newborn.  Increased risk of birth defects, including heart defects.  Increased risk of miscarriage. Newborns born to women who smoke during pregnancy:  Are more likely to be born too early (prematurely).  Are more likely to be at a low birth weight.  Are at risk for serious health problems, chronic or lifelong disabilities (cerebral palsy, mental retardation, learning problems), and possibly even death  Are at risk of Sudden Infant Death Syndrome (SIDS).  Have higher rates of miscarriage and stillbirth.  Have more lung and breathing (respiratory) problems. Long-term effects on a child's behavior: Some of the following trends are seen with children of smoking mothers:  Increased risk for drug abuse, behavior, and conduct  disorders.  Increased risk for smoking in adolescent girls.  Increased risk for negative behavior in 2-year-olds.  Increase risk for asthma, colic, and childhood obesity, which can lead to diabetes.  Increased risk for finger and toe disorders. Resources to stop smoking during pregnancy:  Counseling.  Psychological treatment.  Acupuncture.  Family intervention.  Hypnosis.  Medicines that are safe to take during pregnancy. Nicotine supplements have not been studied enough. They should only be considered when all other methods fail.  Telephone QUIT lines. Smoking and Breastfeeding:  Nicotine gets passed to the infant through a mother's breastmilk. This can cause nausea, colic, cramping, and diarrhea in the infant.  Smoking may reduce milk supply and interfere with the let-down response.  Even formula-fed infants of mothers who smoke have nicotine and cotinine (nicotine by-product) in their urine. Other resources to help stop smoking:  American Cancer Society: www.cancer.org  American Heart Association: www.americanheart.org  National Cancer Institute: www.cancer.gov  Smoke Free Families: www.smokefreefamilies.[address] [place] Line): 639 874 0669 START Document Released: 02/22/2005 Document Revised: 01/03/2012 Document Reviewed: 07/23/2009 [facility] Patient Information 2014 [place], [place].

## 2013-05-22 LAB — OBSTETRIC PANEL
Antibody Screen: NEGATIVE
Basophils Absolute: 0 10*3/uL (ref 0.0–0.1)
Basophils Relative: 0 % (ref 0–1)
Eosinophils Absolute: 0.1 10*3/uL (ref 0.0–0.7)
Hemoglobin: 11.8 g/dL — ABNORMAL LOW (ref 12.0–15.0)
Hepatitis B Surface Ag: NEGATIVE
MCH: 30.9 pg (ref 26.0–34.0)
MCHC: 33.4 g/dL (ref 30.0–36.0)
Monocytes Relative: 5 % (ref 3–12)
Neutrophils Relative %: 67 % (ref 43–77)
RDW: 13.7 % (ref 11.5–15.5)
Rh Type: POSITIVE

## 2013-05-22 LAB — VARICELLA ZOSTER ANTIBODY, IGG: Varicella IgG: 513.1 Index — ABNORMAL HIGH (ref <135.00)

## 2013-05-22 LAB — CULTURE, OB URINE

## 2013-05-22 LAB — VITAMIN D 25 HYDROXY (VIT D DEFICIENCY, FRACTURES): Vit D, 25-Hydroxy: 31 ng/mL (ref 30–89)

## 2013-05-23 ENCOUNTER — Other Ambulatory Visit: Payer: Self-pay | Admitting: Obstetrics & Gynecology

## 2013-05-23 DIAGNOSIS — Z1389 Encounter for screening for other disorder: Secondary | ICD-10-CM

## 2013-05-23 LAB — AFP, QUAD SCREEN
Age Alone: 1:471 {titer}
INH: 308.5 pg/mL
MoM for INH: 2.14
MoM for hCG: 1.33
Osb Risk: 1:10500 {titer}
Tri 18 Scr Risk Est: NEGATIVE
Trisomy 18 (Edward) Syndrome Interp.: 1:25000 {titer}

## 2013-06-05 ENCOUNTER — Ambulatory Visit (INDEPENDENT_AMBULATORY_CARE_PROVIDER_SITE_OTHER): Payer: Medicaid Other

## 2013-06-05 ENCOUNTER — Encounter: Payer: Self-pay | Admitting: Obstetrics

## 2013-06-05 DIAGNOSIS — Z3400 Encounter for supervision of normal first pregnancy, unspecified trimester: Secondary | ICD-10-CM

## 2013-06-05 DIAGNOSIS — Z1389 Encounter for screening for other disorder: Secondary | ICD-10-CM

## 2013-06-05 LAB — US OB DETAIL + 14 WK

## 2013-06-11 ENCOUNTER — Encounter: Payer: Self-pay | Admitting: Obstetrics & Gynecology

## 2013-06-11 LAB — US OB DETAIL + 14 WK

## 2013-06-27 ENCOUNTER — Other Ambulatory Visit: Payer: Self-pay | Admitting: Obstetrics & Gynecology

## 2013-06-27 ENCOUNTER — Ambulatory Visit (INDEPENDENT_AMBULATORY_CARE_PROVIDER_SITE_OTHER): Payer: Medicaid Other | Admitting: Obstetrics & Gynecology

## 2013-06-27 VITALS — BP 122/77 | Temp 97.3°F

## 2013-06-27 DIAGNOSIS — Z3480 Encounter for supervision of other normal pregnancy, unspecified trimester: Secondary | ICD-10-CM

## 2013-06-27 DIAGNOSIS — F4310 Post-traumatic stress disorder, unspecified: Secondary | ICD-10-CM

## 2013-06-27 DIAGNOSIS — Z0489 Encounter for examination and observation for other specified reasons: Secondary | ICD-10-CM

## 2013-06-27 DIAGNOSIS — F419 Anxiety disorder, unspecified: Secondary | ICD-10-CM

## 2013-06-27 DIAGNOSIS — Z3482 Encounter for supervision of other normal pregnancy, second trimester: Secondary | ICD-10-CM

## 2013-06-27 DIAGNOSIS — F603 Borderline personality disorder: Secondary | ICD-10-CM

## 2013-06-27 DIAGNOSIS — F411 Generalized anxiety disorder: Secondary | ICD-10-CM

## 2013-06-27 NOTE — Progress Notes (Signed)
P- 98 Pt states she is having pain in her lower back and lower abdomen. Pt is also having migraines. Pt states she is using Tylenol is not relieving her headaches.  Pt states she needs a letter for her therapist to prescribe medications.

## 2013-06-29 ENCOUNTER — Encounter: Payer: Self-pay | Admitting: Obstetrics & Gynecology

## 2013-06-29 DIAGNOSIS — F419 Anxiety disorder, unspecified: Secondary | ICD-10-CM | POA: Insufficient documentation

## 2013-06-29 DIAGNOSIS — F4310 Post-traumatic stress disorder, unspecified: Secondary | ICD-10-CM | POA: Insufficient documentation

## 2013-06-29 DIAGNOSIS — F603 Borderline personality disorder: Secondary | ICD-10-CM | POA: Insufficient documentation

## 2013-06-29 NOTE — Patient Instructions (Signed)
Pregnancy and Smoking Smoking during pregnancy is very unhealthy for the mother and the developing fetus. The addictive drug in cigarettes (nicotine), carbon monoxide, and many other poisons are inhaled from a cigarette and are carried through your bloodstream to your fetus. Cigarette smoke contains more than 2,500 chemicals. It is not known which of these chemicals are harmful to the developing fetus. However, both nicotine and carbon monoxide play a role in causing health problems in pregnancy. Effects on the fetus of smoking during pregnancy:  Decrease in blood flow and oxygen to the uterus, placenta, and your fetus.  Increased heart rate of the fetus.  Slowing of your fetus's growth in the uterus (intrauterine growth retardation).  Placental problems. Placenta may partially cover or completely cover the opening to the cervix (placenta previa), or the placenta may partially or completely separate from the uterus (placental abruption).  Increase risk of pregnancy outside of the uterus (tubal pregnancy).  Premature rupture membranes, causing the sac that holds the fetus to break too early, resulting in premature birth and increased health risks to the newborn.  Increased risk of birth defects, including heart defects.  Increased risk of miscarriage. Newborns born to women who smoke during pregnancy:  Are more likely to be born too early (prematurely).  Are more likely to be at a low birth weight.  Are at risk for serious health problems, chronic or lifelong disabilities (cerebral palsy, mental retardation, learning problems), and possibly even death  Are at risk of Sudden Infant Death Syndrome (SIDS).  Have higher rates of miscarriage and stillbirth.  Have more lung and breathing (respiratory) problems. Long-term effects on a child's behavior: Some of the following trends are seen with children of smoking mothers:  Increased risk for drug abuse, behavior, and conduct  disorders.  Increased risk for smoking in adolescent girls.  Increased risk for negative behavior in 2-year-olds.  Increase risk for asthma, colic, and childhood obesity, which can lead to diabetes.  Increased risk for finger and toe disorders. Resources to stop smoking during pregnancy:  Counseling.  Psychological treatment.  Acupuncture.  Family intervention.  Hypnosis.  Medicines that are safe to take during pregnancy. Nicotine supplements have not been studied enough. They should only be considered when all other methods fail.  Telephone QUIT lines. Smoking and Breastfeeding:  Nicotine gets passed to the infant through a mother's breastmilk. This can cause nausea, colic, cramping, and diarrhea in the infant.  Smoking may reduce milk supply and interfere with the let-down response.  Even formula-fed infants of mothers who smoke have nicotine and cotinine (nicotine by-product) in their urine. Other resources to help stop smoking:  American Cancer Society: www.cancer.org  American Heart Association: www.americanheart.org  National Cancer Institute: www.cancer.gov  Smoke Free Families: www.smokefreefamilies.org  [employer] (QUIT Line): 1-866-66 START Document Released: 02/22/2005 Document Revised: 01/03/2012 Document Reviewed: 07/23/2009 [employer] Patient Information 2014 [employer].  

## 2013-06-29 NOTE — Progress Notes (Signed)
Counseled re: smoking cessation, resumption of psychiatric medications.  Encouraged f/u w/therapist.

## 2013-07-02 ENCOUNTER — Ambulatory Visit (HOSPITAL_COMMUNITY)
Admission: RE | Admit: 2013-07-02 | Discharge: 2013-07-02 | Disposition: A | Discharge status: Home / Self Care | Payer: Medicaid Other | Source: Ambulatory Visit | Attending: Obstetrics & Gynecology | Admitting: Obstetrics & Gynecology

## 2013-07-02 ENCOUNTER — Encounter (HOSPITAL_COMMUNITY): Payer: Self-pay

## 2013-07-02 ENCOUNTER — Inpatient Hospital Stay (HOSPITAL_COMMUNITY)
Admission: AD | Admit: 2013-07-02 | Discharge: 2013-07-02 | Disposition: A | Discharge status: Home / Self Care | Payer: Medicaid Other | Source: Ambulatory Visit | Attending: Obstetrics | Admitting: Obstetrics

## 2013-07-02 ENCOUNTER — Encounter (HOSPITAL_COMMUNITY): Payer: Self-pay | Admitting: *Deleted

## 2013-07-02 ENCOUNTER — Other Ambulatory Visit: Payer: Self-pay | Admitting: Obstetrics & Gynecology

## 2013-07-02 ENCOUNTER — Ambulatory Visit (HOSPITAL_COMMUNITY)
Admission: RE | Admit: 2013-07-02 | Discharge: 2013-07-02 | Disposition: A | Discharge status: Home / Self Care | Payer: Medicaid Other | Source: Ambulatory Visit

## 2013-07-02 VITALS — BP 130/68 | HR 102 | Wt 218.0 lb

## 2013-07-02 DIAGNOSIS — O99332 Smoking (tobacco) complicating pregnancy, second trimester: Secondary | ICD-10-CM

## 2013-07-02 DIAGNOSIS — Z98891 History of uterine scar from previous surgery: Secondary | ICD-10-CM

## 2013-07-02 DIAGNOSIS — Z0489 Encounter for examination and observation for other specified reasons: Secondary | ICD-10-CM

## 2013-07-02 DIAGNOSIS — O09212 Supervision of pregnancy with history of pre-term labor, second trimester: Secondary | ICD-10-CM

## 2013-07-02 DIAGNOSIS — O26879 Cervical shortening, unspecified trimester: Secondary | ICD-10-CM | POA: Insufficient documentation

## 2013-07-02 DIAGNOSIS — F419 Anxiety disorder, unspecified: Secondary | ICD-10-CM

## 2013-07-02 DIAGNOSIS — Z3482 Encounter for supervision of other normal pregnancy, second trimester: Secondary | ICD-10-CM

## 2013-07-02 DIAGNOSIS — Z8751 Personal history of pre-term labor: Secondary | ICD-10-CM | POA: Insufficient documentation

## 2013-07-02 DIAGNOSIS — O26872 Cervical shortening, second trimester: Secondary | ICD-10-CM

## 2013-07-02 DIAGNOSIS — F603 Borderline personality disorder: Secondary | ICD-10-CM

## 2013-07-02 DIAGNOSIS — Z363 Encounter for antenatal screening for malformations: Secondary | ICD-10-CM | POA: Insufficient documentation

## 2013-07-02 DIAGNOSIS — O34219 Maternal care for unspecified type scar from previous cesarean delivery: Secondary | ICD-10-CM | POA: Insufficient documentation

## 2013-07-02 DIAGNOSIS — O352XX0 Maternal care for (suspected) hereditary disease in fetus, not applicable or unspecified: Secondary | ICD-10-CM | POA: Insufficient documentation

## 2013-07-02 DIAGNOSIS — O358XX0 Maternal care for other (suspected) fetal abnormality and damage, not applicable or unspecified: Secondary | ICD-10-CM | POA: Insufficient documentation

## 2013-07-02 DIAGNOSIS — Z1389 Encounter for screening for other disorder: Secondary | ICD-10-CM | POA: Insufficient documentation

## 2013-07-02 DIAGNOSIS — F4310 Post-traumatic stress disorder, unspecified: Secondary | ICD-10-CM

## 2013-07-02 DIAGNOSIS — O3440 Maternal care for other abnormalities of cervix, unspecified trimester: Secondary | ICD-10-CM | POA: Insufficient documentation

## 2013-07-02 DIAGNOSIS — O4700 False labor before 37 completed weeks of gestation, unspecified trimester: Secondary | ICD-10-CM | POA: Insufficient documentation

## 2013-07-02 MED ORDER — HYDROXYPROGESTERONE CAPROATE 250 MG/ML IM OIL
250.0000 mg | TOPICAL_OIL | Freq: Once | INTRAMUSCULAR | Status: DC
  Filled 2013-07-02: qty 1

## 2013-07-02 MED ORDER — BETAMETHASONE SOD PHOS & ACET 6 (3-3) MG/ML IJ SUSP
12.0000 mg | Freq: Once | INTRAMUSCULAR | Status: AC
  Administered 2013-07-02: 12 mg via INTRAMUSCULAR
  Filled 2013-07-02: qty 2

## 2013-07-02 NOTE — Consult Note (Addendum)
MFM consult  32 yr old [id number] at 100w4d with history of preterm delivery referred by Dr. Tamela Oddi for fetal anatomic survey, cervical length, and consult. Elevated inhibin of 2. on quad screen. 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  Patient has one prior preterm delivery around 24 weeks. Currently is reporting cramping and contractions over the weekend.  Ultrasound today shows: estimated fetal weight is in the 64th%. Anterior placenta without evidence of previa. Normal amniotic fluid volume. Shortened transvaginal cervical length of 2cm. No funneling is seen. The views of the palate and aortic and ductal arches are limited. The remainder of the limited anatomy survey is normal.  Quad screen shows elevated inhibin of 2. ; risk of trisomy 25 is 1:342; risk of trisomy 49 is 1:25,000  I discussed the findings of the ultrasound with the patient and counseled her as follows:  1. Appropriate fetal growth. 2. Limited anatomy survey: - will reattempt to complete anatomic survey on follow up ultrasound 3. Shortened cervix: - I discussed the increased risk of preterm labor/PPROM and preterm delivery with short cervix especially in the context of prior preterm delivery. Given this increased risk I recommend that the patient receive a course of betamethasone to decrease risks of prematurity. I explained briefly the survival rates and morbidity rates of a fetus born at [redacted] weeks gestation but would also recommend a formal Neonatology consult.  I explained utility of betamethasone. I recommend the patient be evaluated for preterm labor in triage. Evaluation for contractions should take place. If the patient is stable (no contractions, no cervical dilation, and no cervical change) patient can receive course of betamethasone as an outpatient.  I would recommend strict preterm labor precautions and follow up within a week for cervical length..  If patient is regularly  contracting and changing her cervix then recommend inpatient management with betamethasone and tocolysis with either indomethacin or nifedipine. If delivery is imminent recommend magnesium sulfate for neuroprotection. Load of 6 grams IV over 20 minutes followed by 2 grams/hr for 12 hours. If not delivered discontinue magnesium sulfate.   - sent to triage for evaluation and betamethasone administration - recommend preterm labor precautions - recommend start vaginal progesterone; discussed with patient the data that vaginal progesterone started in women with short cervix has been shown to decrease risk of preterm delivery by up to 30-40%. Discussed no known adverse effects but long term data is limited. - recommend follow up cervical length in 1 week 4. Elevated inhibin: - I discussed the association with fetal aneuploidy (although had normal aneuploidy risk on quad screen- although trisomy 21 risk is slightly greater than age related risk). I also discussed the association with increased risk of adverse pregnancy outcomes including increased risk of fetal growth restriction, preeclampsia, stillbirth, preterm birth, and abruption. I discussed these risks are only slightly increased over baseline but heightened surveillance is warranted. I recommend serial fetal growth ultrasounds every 4 weeks. I recommend antenatal testing only in the setting of fetal growth restriction. I recommend close surveillance for the development of signs/symptoms of preterm labor, PPROM, and preeclampsia.  5. 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  I spent 30 minutes in face  to face consultation with the patient in addition to time spent on the ultrasound.  The above was discussed with Dr. Clearance Coots who is in agreement with the above plan.  Eulis Foster, MD

## 2013-07-02 NOTE — Progress Notes (Signed)
[facility] ultrasound  Indication: 32 yr old [id number] at [redacted]w[redacted]d with history of preterm delivery for fetal anatomic survey and cervical length. Elevated inhibin of 2. on quad screen. 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  Findings: 1. Single intrauterine pregnancy. 2. Estimated fetal weight is in the 64th%. 3. Anterior placenta without evidence of previa. 4. Normal amniotic fluid volume. 5. Shortened transvaginal cervical length of 2cm. No funneling is seen. 6. The views of the palate and aortic and ductal arches are limited. 7. The remainder of the limited anatomy survey is normal.  Recommendations: 1. Appropriate fetal growth. 2. Limited anatomy survey: - will reattempt to complete anatomic survey on follow up ultrasound 3. Shortened cervix: - see consult letter - sent to triage for evaluation and betamethasone administration - recommend preterm labor precautions - recommend start vaginal progesterone - recommend follow up cervical length in 1 week 4. Elevated inhibin: - see consult letter - I discussed the association with fetal aneuploidy (although had normal aneuploidy risk on quad screen). I also discussed the association with increased risk of adverse pregnancy outcomes including increased risk of fetal growth restriction, preeclampsia, stillbirth, preterm birth, and abruption. I discussed these risks are only slightly increased over baseline but heightened surveillance is warranted. I recommend serial fetal growth ultrasounds every 4 weeks. I recommend antenatal testing only in the setting of fetal growth restriction. I recommend close surveillance for the development of signs/symptoms of preterm labor, PPROM, and preeclampsia.  5. 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  Eulis Foster, MD

## 2013-07-02 NOTE — MAU Note (Signed)
Sent over from MFM for further evaluation and possible Betamethasone;

## 2013-07-02 NOTE — MAU Note (Signed)
C/o being hungry; c/o N&V: c/o abdominal cramping and is constipated;

## 2013-07-02 NOTE — MAU Provider Note (Signed)
History     CSN: [id number]  Arrival date and time: 07/02/13 1242   First Provider Initiated Contact with Patient 07/02/13 1402      Chief Complaint  Patient presents with  . Laboring   HPI  [name] is a 32 y.o. femal; [phone] at [redacted]w[redacted]d who presents to MAU to be evaluated for preterm labor. She is a patient at [facility]; she was seen at maternal fetal care today for NST and an US showed a shortened cervix. She was sent over here for evaluation and for beta-methasone injection.  She complains of back pain 3/10; She has had this back pain off and on her whole pregnancy.  She has a history of 1 preterm delivery at 24 weeks.    OB History   Grav Para Term Preterm Abortions TAB SAB Ect Mult Living   5 3 2 1 1 1  0 0 0 3      Past Medical History  Diagnosis Date  . HPV in female   . Migraines   . HPV in female   . Abnormal Pap smear   . Termination of pregnancy     x 1  . PTSD (post-traumatic stress disorder)     No meds  . Borderline personality disorder     No meds  . Panic anxiety syndrome     no meds  . Heartburn in pregnancy   . Scoliosis of lumbar spine     Past Surgical History  Procedure Laterality Date  . Cesarean section      x 2  . Colposcopy    . Wisdom tooth extraction    . Dilation and curettage of uterus    . Cesarean section  07/21/2012    Procedure: CESAREAN SECTION;  Surgeon: Suzanne Cosier, MD;  Location: [facility];  Service: Obstetrics;  Laterality: N/A;  Repeat Cesarean Section Delivery Boy @ [phone], Apgars 9/9    Family History  Problem Relation Age of Onset  . Cancer Mother   . Heart disease Mother   . Anesthesia problems Neg Hx     History  Substance Use Topics  . Smoking status: Current Every Day Smoker -- 0.25 packs/day for 13 years    Types: Cigarettes  . Smokeless tobacco: Never Used  . Alcohol Use: No    Allergies:  Allergies  Allergen Reactions  . Banana Nausea And Vomiting and Rash  . Latex Rash  . Poison  Ivy Extract [Extract Of Poison Ivy] Rash  . Tomato Rash    Prescriptions prior to admission  Medication Sig Dispense Refill  . acetaminophen (TYLENOL) 500 MG tablet Take 1,000 mg by mouth every 6 (six) hours as needed for pain.      . calcium carbonate (TUMS - DOSED IN MG ELEMENTAL CALCIUM) 500 MG chewable tablet Chew 2 tablets by mouth 3 (three) times daily as needed for heartburn.      . diphenhydramine-acetaminophen (TYLENOL PM) 25-500 MG TABS Take 1-2 tablets by mouth at bedtime as needed (for sleep).      . Prenatal Vit-Fe Fumarate-FA (PRENATAL MULTIVITAMIN) TABS tablet Take 1 tablet by mouth daily at 12 noon.        OBSTETRICS REPORT (Corrected Final 07/02/2013 01:39 pm) ---------------------------------------------------------------------- Patient Info  ID #: [id number] D.O.B.: [birth year] (31 yrs) Name: Suzanne Klein Visit Date: 07/02/2013 11:20 am ---------------------------------------------------------------------- Performed By  Performed By: Suzanne Klein BS, Referred By: Suzanne Klein RDMS MD Attending: Erle Crocker MD Location[facility] ----------------------------------------------------------------------  Service(s) Provided  US OB DETAIL + 14 WK 76811.0 US OB TRANSVAGINAL [id number] ---------------------------------------------------------------------- Indications  Detailed fetal anatomic survey 655.83 V28.3 Poor obstetric history: Previous preterm delivery V13.21 (24 weeks, daughter) History of genetic / anatomic abnormality - CHD 45.23 (older son) History of genetic / anatomic abnormality - cleft lip 655.23 \\T \ palate (younger son) History of cesarean delivery, currently pregnant 654.20, V45.89 Previous cervical surgery (colposcopy) 654.63 Anxiety, PTSD ---------------------------------------------------------------------- Fetal Evaluation  Num Of Fetuses: 1 Fetal Heart Rate: 155 bpm Cardiac Activity:  Observed Presentation: Cephalic Placenta: Anterior, above cervical os P. Cord Visualized, central Insertion:  Amniotic Fluid AFI FV: Subjectively within normal limits Larg Pckt: 5.8 cm ---------------------------------------------------------------------- Biometry  BPD: 61.1 mm G. Age: 24w 6d CI: 77.0 70 - 86 OFD: 79.3 mm FL/HC: 20.2 18.7 - 20.3 HC: 226 mm G. Age: 24w 4d 33 % HC/AC: 1.09 1.04 - 1.22 AC: 208.2 mm G. Age: 25w 3d 66 % FL/BPD: 74.6 71 - 87 FL: 45.6 mm G. Age: 25w 1d 54 % FL/AC: 21.9 20 - 24 HUM: 42.3 mm G. Age: 25w 3d 63 % CER: 28.3 mm G. Age: 25w 2d 65 %  Est. FW: 779 gm 1 lb 11 oz 64 % ---------------------------------------------------------------------- Gestational Age  LMP: 23w 3d Date: 01/19/13 EDD: 10/26/13 U/S Today: 25w 0d EDD: 10/15/13 Best: 24w 4d Det. ByMarcella Dubs EDD: 10/18/13 (03/06/13) ---------------------------------------------------------------------- Anatomy  Cranium: Appears normal Aortic Arch: Not well visualized Fetal Cavum: Appears normal Ductal Arch: Not well visualized Ventricles: Appears normal Diaphragm: Appears normal Choroid Plexus: Appears normal Stomach: Appears normal Cerebellum: Appears normal Abdomen: Appears normal Posterior Fossa: Appears normal Abdominal Wall: Appears nml (cord insert, abd wall) Nuchal Fold: Not applicable (>20 Cord Vessels: Appears normal ([redacted] wks GA) vessel cord) Face: Appears normal Kidneys: Appear normal (orbits and profile) Lips: Appears normal Bladder: Appears normal Palate: Not well visualized Spine: Appears normal Heart: Appears normal Lower Appears normal (4CH, axis, and Extremities: situs) RVOT: Appears normal Upper Appears normal Extremities: LVOT: Appears normal  Other: Fetus appears to be a female. Heels and 5th digit visualized. Nasal bone visualized. Technically difficult due to maternal habitus and  fetal position. ---------------------------------------------------------------------- Targeted Anatomy  Fetal Central Nervous System Cisterna Magna: 4.8 ---------------------------------------------------------------------- Cervix Uterus Adnexa  Cervical Length: 2 cm  Cervix: Appears closed, without funnelling.  Left Ovary: Not visualized. Right Ovary: Within normal limits.  Adnexa: No abnormality visualized.   Review of Systems  Constitutional: Negative for fever and chills.  Gastrointestinal: Positive for nausea, diarrhea and constipation. Negative for vomiting and abdominal pain.  Genitourinary: Negative for dysuria, urgency and frequency.       No vaginal discharge. No vaginal bleeding. No dysuria.   Neurological: Negative for headaches.   Physical Exam   Blood pressure 120/60, pulse 91, temperature 97.5 F (36.4 C), temperature source Oral, resp. rate 16, last menstrual period 01/19/2013.  Physical Exam  Constitutional: She is oriented to person, place, and time. She appears well-developed and well-nourished. No distress.  Neck: Neck supple.  GI: Soft. She exhibits no distension. There is no tenderness. There is no rebound and no guarding.  Genitourinary:  Speculum exam: Vagina - Small amount of creamy/clear discharge, no odor Cervix - No contact bleeding Bimanual exam: Cervix: internal os closed, external os slightly open FT, Soft, middle position  Uterus non tender, normal size Adnexa non tender, no masses bilaterally GC/Chlam, wet prep done Chaperone present for exam.   Neurological: She is alert and oriented to person, place, and time.  Skin: Skin is warm. She is not diaphoretic.  Psychiatric: Her mood appears anxious. She expresses impulsivity.   Fetal Tracing:  Baseline: 130 bpm Variability: Moderate Accelerations: + Decelerations: None  Toco: No contractions    MAU Course  Procedures  MDM  Wet prep GC/Chlamydia- pending Unable to  obtain fetal fibronectin due to prior exam at maternal fetal care.  Betamethasone 12 mg IM today in MAU  17P injection today in MAU   Consulted with Dr. Clearance Coots at 1530: Pt to receive 17p today in MAU, return tomorrow for 2nd betamethasone, Return to Dr. Thomes Lolling office on Monday for weekly 17P Pt is very nervous and stating that she does not like to be in hospitals; I encouraged her to stay for 17p today. Pt understands she has to come back tomorrow for 2nd betamethasone.  Assessment and Plan  A: Shortened cervical length  Preterm labor   P:  Discharge home Return to MAU tomorrow 07/03/2013 after 1400 to receive your second dose of betamethasone Follow up with maternal fetal medicine on Monday 07/09/2013 Follow up with Dr. Thomes Lolling office 07/09/2013 for weekly 17p injection  Return to MAU with any contractions, abdominal, or vaginal bleeding.  Pelvic rest discussed Support given   Venia Carbon IRENE FNP-C 07/02/2013, 4:47 PM

## 2013-07-03 LAB — GC/CHLAMYDIA PROBE AMP: GC Probe RNA: NEGATIVE

## 2013-07-03 NOTE — MAU Note (Signed)
Patient phoned and stated she is due for her second Betamethasone injection Patient states she does not feel well and is not coming today. Explained to patient the recommended time frame for the second injection is 24 hours and she acknowledges this but states she will not come today but will come tomorrow.

## 2013-07-04 ENCOUNTER — Inpatient Hospital Stay (HOSPITAL_COMMUNITY)
Admission: AD | Admit: 2013-07-04 | Discharge: 2013-07-04 | Disposition: A | Discharge status: Home / Self Care | Payer: Medicaid Other | Source: Ambulatory Visit | Attending: Obstetrics & Gynecology | Admitting: Obstetrics & Gynecology

## 2013-07-04 DIAGNOSIS — O4700 False labor before 37 completed weeks of gestation, unspecified trimester: Secondary | ICD-10-CM | POA: Insufficient documentation

## 2013-07-04 MED ORDER — BETAMETHASONE SOD PHOS & ACET 6 (3-3) MG/ML IJ SUSP
12.0000 mg | Freq: Once | INTRAMUSCULAR | Status: AC
  Administered 2013-07-04: 12 mg via INTRAMUSCULAR
  Filled 2013-07-04: qty 2

## 2013-07-04 NOTE — MAU Note (Signed)
Called patient from the lobby to give injection. She was not in the lobby.

## 2013-07-04 NOTE — MAU Note (Signed)
Clarified with Dr. Reola Calkins that patient will have this injection since she missed yesterday.

## 2013-07-05 ENCOUNTER — Encounter: Payer: Self-pay | Admitting: Obstetrics & Gynecology

## 2013-07-06 ENCOUNTER — Encounter: Payer: Self-pay | Admitting: Obstetrics & Gynecology

## 2013-07-06 DIAGNOSIS — O280 Abnormal hematological finding on antenatal screening of mother: Secondary | ICD-10-CM | POA: Insufficient documentation

## 2013-07-06 DIAGNOSIS — O3430 Maternal care for cervical incompetence, unspecified trimester: Secondary | ICD-10-CM | POA: Insufficient documentation

## 2013-07-09 ENCOUNTER — Ambulatory Visit (HOSPITAL_COMMUNITY)
Admission: RE | Admit: 2013-07-09 | Discharge: 2013-07-09 | Disposition: A | Discharge status: Home / Self Care | Payer: Medicaid Other | Source: Ambulatory Visit | Attending: Obstetrics & Gynecology | Admitting: Obstetrics & Gynecology

## 2013-07-09 ENCOUNTER — Ambulatory Visit: Payer: Medicaid Other | Admitting: *Deleted

## 2013-07-09 ENCOUNTER — Encounter: Payer: Medicaid Other | Admitting: Obstetrics & Gynecology

## 2013-07-09 ENCOUNTER — Ambulatory Visit (INDEPENDENT_AMBULATORY_CARE_PROVIDER_SITE_OTHER): Payer: Medicaid Other | Admitting: Obstetrics & Gynecology

## 2013-07-09 ENCOUNTER — Other Ambulatory Visit: Payer: Self-pay | Admitting: *Deleted

## 2013-07-09 VITALS — BP 113/73 | Temp 98.7°F | Wt 219.0 lb

## 2013-07-09 VITALS — BP 113/73 | HR 99 | Temp 98.7°F | Wt 219.0 lb

## 2013-07-09 DIAGNOSIS — J069 Acute upper respiratory infection, unspecified: Secondary | ICD-10-CM

## 2013-07-09 DIAGNOSIS — H00019 Hordeolum externum unspecified eye, unspecified eyelid: Secondary | ICD-10-CM

## 2013-07-09 DIAGNOSIS — Z3480 Encounter for supervision of other normal pregnancy, unspecified trimester: Secondary | ICD-10-CM

## 2013-07-09 DIAGNOSIS — R05 Cough: Secondary | ICD-10-CM

## 2013-07-09 DIAGNOSIS — O34219 Maternal care for unspecified type scar from previous cesarean delivery: Secondary | ICD-10-CM | POA: Insufficient documentation

## 2013-07-09 DIAGNOSIS — O352XX0 Maternal care for (suspected) hereditary disease in fetus, not applicable or unspecified: Secondary | ICD-10-CM | POA: Insufficient documentation

## 2013-07-09 DIAGNOSIS — Z8751 Personal history of pre-term labor: Secondary | ICD-10-CM | POA: Insufficient documentation

## 2013-07-09 DIAGNOSIS — O09211 Supervision of pregnancy with history of pre-term labor, first trimester: Secondary | ICD-10-CM

## 2013-07-09 DIAGNOSIS — O3440 Maternal care for other abnormalities of cervix, unspecified trimester: Secondary | ICD-10-CM | POA: Insufficient documentation

## 2013-07-09 DIAGNOSIS — O26879 Cervical shortening, unspecified trimester: Secondary | ICD-10-CM | POA: Insufficient documentation

## 2013-07-09 DIAGNOSIS — O09219 Supervision of pregnancy with history of pre-term labor, unspecified trimester: Secondary | ICD-10-CM

## 2013-07-09 DIAGNOSIS — H00016 Hordeolum externum left eye, unspecified eyelid: Secondary | ICD-10-CM

## 2013-07-09 MED ORDER — HYDROXYPROGESTERONE CAPROATE 250 MG/ML IM OIL
250.0000 mg | TOPICAL_OIL | Freq: Once | INTRAMUSCULAR | Status: AC
  Administered 2013-07-09: 250 mg via INTRAMUSCULAR

## 2013-07-09 MED ORDER — AZITHROMYCIN 250 MG PO TABS: ORAL_TABLET | ORAL | Status: DC

## 2013-07-09 NOTE — Progress Notes (Signed)
P 99 Patient reports she has had a cough for 2 weeks without improvment. Patient was seen at hospital and started on 17P and steroid injections.

## 2013-07-09 NOTE — Patient Instructions (Addendum)
Upper Respiratory Infection, Adult An upper respiratory infection (URI) is also known as the common cold. It is often caused by a type of germ (virus). Colds are easily spread (contagious). You can pass it to others by kissing, coughing, sneezing, or drinking out of the same glass. Usually, you get better in 1 or 2 weeks.  HOME CARE   Only take medicine as told by your doctor.  Use a warm mist humidifier or breathe in steam from a hot shower.  Drink enough water and fluids to keep your pee (urine) clear or pale yellow.  Get plenty of rest.  Return to work when your temperature is back to normal or as told by your doctor. You may use a face mask and wash your hands to stop your cold from spreading. GET HELP RIGHT AWAY IF:   After the first few days, you feel you are getting worse.  You have questions about your medicine.  You have chills, shortness of breath, or brown or red spit (mucus).  You have yellow or brown snot (nasal discharge) or pain in the face, especially when you bend forward.  You have a fever, puffy (swollen) neck, pain when you swallow, or white spots in the back of your throat.  You have a bad headache, ear pain, sinus pain, or chest pain.  You have a high-pitched whistling sound when you breathe in and out (wheezing).  You have a lasting cough or cough up blood.  You have sore muscles or a stiff neck. MAKE SURE YOU:   Understand these instructions.  Will watch your condition.  Will get help right away if you are not doing well or get worse. Document Released: 03/29/2008 Document Revised: 01/03/2012 Document Reviewed: 02/15/2011 [facility] Patient Information 2014 [place], [place]. Sty A sty (hordeolum) is an infection of a gland in the eyelid located at the base of the eyelash. A sty may develop a white or yellow head of pus. It can be puffy (swollen). Usually, the sty will burst and pus will come out on its own. They do not leave lumps in the eyelid once they  drain. A sty is often confused with another form of cyst of the eyelid called a chalazion. Chalazions occur within the eyelid and not on the edge where the bases of the eyelashes are. They often are red, sore and then form firm lumps in the eyelid. CAUSES   Germs (bacteria).  Lasting (chronic) eyelid inflammation. SYMPTOMS   Tenderness, redness and swelling along the edge of the eyelid at the base of the eyelashes.  Sometimes, there is a white or yellow head of pus. It may or may not drain. DIAGNOSIS  An ophthalmologist will be able to distinguish between a sty and a chalazion and treat the condition appropriately.  TREATMENT   Styes are typically treated with warm packs (compresses) until drainage occurs.  In rare cases, medicines that kill germs (antibiotics) may be prescribed. These antibiotics may be in the form of drops, cream or pills.  If a hard lump has formed, it is generally necessary to do a small incision and remove the hardened contents of the cyst in a minor surgical procedure done in the office.  In suspicious cases, your caregiver may send the contents of the cyst to the lab to be certain that it is not a rare, but dangerous form of cancer of the glands of the eyelid. HOME CARE INSTRUCTIONS   Wash your hands often and dry them with a clean  towel. Avoid touching your eyelid. This may spread the infection to other parts of the eye.  Apply heat to your eyelid for 10 to 20 minutes, several times a day, to ease pain and help to heal it faster.  Do not squeeze the sty. Allow it to drain on its own. Wash your eyelid carefully 3 to 4 times per day to remove any pus. SEEK IMMEDIATE MEDICAL CARE IF:   Your eye becomes painful or puffy (swollen).  Your vision changes.  Your sty does not drain by itself within 3 days.  Your sty comes back within a short period of time, even with treatment.  You have redness (inflammation) around the eye.  You have a fever. Document  Released: 07/21/2005 Document Revised: 01/03/2012 Document Reviewed: 03/25/2009 [facility] Patient Information 2014 [place], [place]. Glucose Tolerance Test This is a test to see how your body processes carbohydrates. This test is often done to check patients for diabetes or the possibility of developing it. PREPARATION FOR TEST You should have nothing to eat or drink 12 hours before the test. You will be given a form of sugar (glucose) and then blood samples will be drawn from your vein to determine the level of sugar in your blood. Alternatively, blood may be drawn from your finger for testing. You should not smoke or exercise during the test. NORMAL FINDINGS  Fasting: 70-115 mg/dL  30 minutes: less than 200 mg/dL  1 hour: less than 147 mg/dL  2 hours: less than 829 mg/dL  3 hours: 56-213 mg/dL  4 hours: 08-657 mg/dL Ranges for normal findings may vary among different laboratories and hospitals. You should always check with your doctor after having lab work or other tests done to discuss the meaning of your test results and whether your values are considered within normal limits. MEANING OF TEST Your caregiver will go over the test results with you and discuss the importance and meaning of your results, as well as treatment options and the need for additional tests. OBTAINING THE TEST RESULTS It is your responsibility to obtain your test results. Ask the lab or department performing the test when and how you will get your results. Document Released: 11/03/2004 Document Revised: 01/03/2012 Document Reviewed: 09/21/2008 [facility] Patient Information 2014 [place], [place].

## 2013-07-09 NOTE — Addendum Note (Signed)
Addended by: Glendell Docker on: 07/09/2013 10:49 AM   Modules accepted: Orders

## 2013-07-09 NOTE — Progress Notes (Signed)
Patient is in the office today for follow up to hospital visit. Patient plans to deliver at [facility]- but is doing her pregnancy care here. See pregnancy visit.

## 2013-07-09 NOTE — Progress Notes (Signed)
C/O purulent drainage from left eye, cough, SOB.  ?fever.  Continues to cutback tobacco use.  CxR today.

## 2013-07-10 ENCOUNTER — Ambulatory Visit (HOSPITAL_COMMUNITY)
Admission: RE | Admit: 2013-07-10 | Discharge: 2013-07-10 | Disposition: A | Discharge status: Home / Self Care | Payer: Medicaid Other | Source: Ambulatory Visit | Attending: Obstetrics & Gynecology | Admitting: Obstetrics & Gynecology

## 2013-07-10 DIAGNOSIS — R509 Fever, unspecified: Secondary | ICD-10-CM | POA: Insufficient documentation

## 2013-07-10 DIAGNOSIS — R05 Cough: Secondary | ICD-10-CM

## 2013-07-10 DIAGNOSIS — R059 Cough, unspecified: Secondary | ICD-10-CM | POA: Insufficient documentation

## 2013-07-10 DIAGNOSIS — O99891 Other specified diseases and conditions complicating pregnancy: Secondary | ICD-10-CM | POA: Insufficient documentation

## 2013-07-10 DIAGNOSIS — R0602 Shortness of breath: Secondary | ICD-10-CM | POA: Insufficient documentation

## 2013-07-12 ENCOUNTER — Other Ambulatory Visit: Payer: Self-pay | Admitting: Obstetrics & Gynecology

## 2013-07-12 DIAGNOSIS — O352XX1 Maternal care for (suspected) hereditary disease in fetus, fetus 1: Secondary | ICD-10-CM

## 2013-07-12 DIAGNOSIS — O3421 Maternal care for scar from previous cesarean delivery: Secondary | ICD-10-CM

## 2013-07-12 DIAGNOSIS — Z8751 Personal history of pre-term labor: Secondary | ICD-10-CM

## 2013-07-12 DIAGNOSIS — O3440 Maternal care for other abnormalities of cervix, unspecified trimester: Secondary | ICD-10-CM

## 2013-07-12 DIAGNOSIS — O26879 Cervical shortening, unspecified trimester: Secondary | ICD-10-CM

## 2013-07-12 DIAGNOSIS — Z9889 Other specified postprocedural states: Secondary | ICD-10-CM

## 2013-07-13 ENCOUNTER — Encounter: Payer: Self-pay | Admitting: Obstetrics & Gynecology

## 2013-07-16 ENCOUNTER — Other Ambulatory Visit: Payer: Medicaid Other

## 2013-07-16 ENCOUNTER — Ambulatory Visit (HOSPITAL_COMMUNITY): Payer: Medicaid Other

## 2013-07-19 ENCOUNTER — Ambulatory Visit (HOSPITAL_COMMUNITY)
Admission: RE | Admit: 2013-07-19 | Discharge: 2013-07-19 | Disposition: A | Discharge status: Home / Self Care | Payer: Medicaid Other | Source: Ambulatory Visit | Attending: Obstetrics & Gynecology | Admitting: Obstetrics & Gynecology

## 2013-07-19 ENCOUNTER — Other Ambulatory Visit: Payer: Medicaid Other

## 2013-07-19 VITALS — BP 123/66 | HR 102 | Wt 219.0 lb

## 2013-07-19 VITALS — BP 128/74 | HR 105 | Temp 98.3°F | Ht 66.5 in | Wt 220.2 lb

## 2013-07-19 DIAGNOSIS — Z8751 Personal history of pre-term labor: Secondary | ICD-10-CM | POA: Insufficient documentation

## 2013-07-19 DIAGNOSIS — O26879 Cervical shortening, unspecified trimester: Secondary | ICD-10-CM | POA: Insufficient documentation

## 2013-07-19 DIAGNOSIS — O3432 Maternal care for cervical incompetence, second trimester: Secondary | ICD-10-CM

## 2013-07-19 DIAGNOSIS — O352XX1 Maternal care for (suspected) hereditary disease in fetus, fetus 1: Secondary | ICD-10-CM

## 2013-07-19 DIAGNOSIS — O3440 Maternal care for other abnormalities of cervix, unspecified trimester: Secondary | ICD-10-CM | POA: Insufficient documentation

## 2013-07-19 DIAGNOSIS — Z9889 Other specified postprocedural states: Secondary | ICD-10-CM

## 2013-07-19 DIAGNOSIS — O34219 Maternal care for unspecified type scar from previous cesarean delivery: Secondary | ICD-10-CM | POA: Insufficient documentation

## 2013-07-19 DIAGNOSIS — O352XX0 Maternal care for (suspected) hereditary disease in fetus, not applicable or unspecified: Secondary | ICD-10-CM | POA: Insufficient documentation

## 2013-07-19 DIAGNOSIS — O3421 Maternal care for scar from previous cesarean delivery: Secondary | ICD-10-CM

## 2013-07-19 MED ORDER — HYDROXYPROGESTERONE CAPROATE 250 MG/ML IM OIL
250.0000 mg | TOPICAL_OIL | INTRAMUSCULAR | Status: AC
  Administered 2013-07-19 – 2013-08-07 (×3): 250 mg via INTRAMUSCULAR

## 2013-07-19 NOTE — Progress Notes (Signed)
Suzanne Klein  was seen today for an ultrasound appointment.  See full report in [facility].  Impression: Single IUP at 27 0/7 weeks Hx of preterm delivery at 24 weeks, previous child with heart defect and another child with CL/CP, cervical shortening Normal fetal echo; completed a course of betamethasone TVUS - cervical length 1.4 cm; stable since last exam  Recommendations: Recommend follow-up ultrasound examination in 2 weeks for interval growth Continue vaginal progesterone  Alpha Gula, MD

## 2013-07-23 ENCOUNTER — Encounter: Payer: Medicaid Other | Admitting: Obstetrics & Gynecology

## 2013-07-31 ENCOUNTER — Telehealth: Payer: Self-pay | Admitting: *Deleted

## 2013-07-31 NOTE — Telephone Encounter (Signed)
Patient called to reports she is having pain that makes it almost impossible to walk.She reports heavy discharge also. Patient has had changes in her status- she is now living at the [facility]. Patient states she her therapist has tried to contact Dr Tamela Oddi regarding her medications and has not been able to get to her. Patient request a letter for the center with her current restrictions. Patient has an appointment tomorrow- but advised to go to MAU if she felt she could not wait until her appointment. Patient wants to see Dr Tamela Oddi first.

## 2013-08-01 ENCOUNTER — Encounter: Payer: Self-pay | Admitting: *Deleted

## 2013-08-01 ENCOUNTER — Encounter: Payer: Self-pay | Admitting: Obstetrics & Gynecology

## 2013-08-01 ENCOUNTER — Ambulatory Visit (INDEPENDENT_AMBULATORY_CARE_PROVIDER_SITE_OTHER): Payer: Medicaid Other | Admitting: Obstetrics & Gynecology

## 2013-08-01 VITALS — BP 112/75 | Temp 98.2°F | Wt 219.8 lb

## 2013-08-01 DIAGNOSIS — Z3482 Encounter for supervision of other normal pregnancy, second trimester: Secondary | ICD-10-CM

## 2013-08-01 DIAGNOSIS — Z3480 Encounter for supervision of other normal pregnancy, unspecified trimester: Secondary | ICD-10-CM

## 2013-08-01 DIAGNOSIS — O3440 Maternal care for other abnormalities of cervix, unspecified trimester: Secondary | ICD-10-CM

## 2013-08-01 DIAGNOSIS — O3432 Maternal care for cervical incompetence, second trimester: Secondary | ICD-10-CM

## 2013-08-01 LAB — POCT URINALYSIS DIPSTICK
Bilirubin, UA: NEGATIVE
Glucose, UA: NEGATIVE
Ketones, UA: NEGATIVE
Leukocytes, UA: NEGATIVE
Nitrite, UA: NEGATIVE

## 2013-08-01 NOTE — Patient Instructions (Signed)
Glucose Tolerance Test This is a test to see how your body processes carbohydrates. This test is often done to check patients for diabetes or the possibility of developing it. PREPARATION FOR TEST You should have nothing to eat or drink 12 hours before the test. You will be given a form of sugar (glucose) and then blood samples will be drawn from your vein to determine the level of sugar in your blood. Alternatively, blood may be drawn from your finger for testing. You should not smoke or exercise during the test. NORMAL FINDINGS  Fasting: 70-115 mg/dL  30 minutes: less than 200 mg/dL  1 hour: less than 200 mg/dL  2 hours: less than 140 mg/dL  3 hours: 70-115 mg/dL  4 hours: 70-115 mg/dL Ranges for normal findings may vary among different laboratories and hospitals. You should always check with your doctor after having lab work or other tests done to discuss the meaning of your test results and whether your values are considered within normal limits. MEANING OF TEST Your caregiver will go over the test results with you and discuss the importance and meaning of your results, as well as treatment options and the need for additional tests. OBTAINING THE TEST RESULTS It is your responsibility to obtain your test results. Ask the lab or department performing the test when and how you will get your results. Document Released: 11/03/2004 Document Revised: 01/03/2012 Document Reviewed: 09/21/2008 [employer] Patient Information 2014 [employer].  

## 2013-08-01 NOTE — Progress Notes (Signed)
Pulse- 98 2 hr GTT/flu vaccine next visit.  Plans BTL.

## 2013-08-03 ENCOUNTER — Ambulatory Visit (HOSPITAL_COMMUNITY): Payer: Medicaid Other

## 2013-08-07 ENCOUNTER — Other Ambulatory Visit: Payer: Medicaid Other

## 2013-08-07 ENCOUNTER — Ambulatory Visit (INDEPENDENT_AMBULATORY_CARE_PROVIDER_SITE_OTHER): Payer: Medicaid Other | Admitting: *Deleted

## 2013-08-07 VITALS — BP 115/75 | HR 96 | Temp 98.4°F | Wt 220.0 lb

## 2013-08-07 DIAGNOSIS — Z23 Encounter for immunization: Secondary | ICD-10-CM

## 2013-08-07 NOTE — Progress Notes (Signed)
Pt in office today for weekly 17P injection and for a flu shot.  Pt given both injections. Pt tolerated injections well.

## 2013-08-08 ENCOUNTER — Ambulatory Visit (HOSPITAL_COMMUNITY): Payer: Medicaid Other

## 2013-08-08 ENCOUNTER — Encounter: Payer: Self-pay | Admitting: Obstetrics & Gynecology

## 2013-08-08 ENCOUNTER — Ambulatory Visit: Payer: Medicaid Other

## 2013-08-08 ENCOUNTER — Other Ambulatory Visit: Payer: Medicaid Other

## 2013-08-08 DIAGNOSIS — Z3482 Encounter for supervision of other normal pregnancy, second trimester: Secondary | ICD-10-CM

## 2013-08-08 DIAGNOSIS — Z3480 Encounter for supervision of other normal pregnancy, unspecified trimester: Secondary | ICD-10-CM

## 2013-08-08 LAB — CBC
MCHC: 33.2 g/dL (ref 30.0–36.0)
Platelets: 202 10*3/uL (ref 150–400)
RDW: 14 % (ref 11.5–15.5)
WBC: 6.9 10*3/uL (ref 4.0–10.5)

## 2013-08-09 LAB — GLUCOSE TOLERANCE, 2 HOURS W/ 1HR
Glucose, 1 hour: 135 mg/dL (ref 70–170)
Glucose, 2 hour: 106 mg/dL (ref 70–139)
Glucose, Fasting: 72 mg/dL (ref 70–99)

## 2013-08-09 LAB — HIV ANTIBODY (ROUTINE TESTING W REFLEX): HIV: NONREACTIVE

## 2013-08-09 LAB — RPR

## 2013-08-15 ENCOUNTER — Ambulatory Visit (INDEPENDENT_AMBULATORY_CARE_PROVIDER_SITE_OTHER): Payer: Medicaid Other | Admitting: Obstetrics & Gynecology

## 2013-08-15 ENCOUNTER — Encounter: Payer: Self-pay | Admitting: Obstetrics & Gynecology

## 2013-08-15 VITALS — BP 135/80 | Temp 97.5°F | Wt 219.0 lb

## 2013-08-15 DIAGNOSIS — O09219 Supervision of pregnancy with history of pre-term labor, unspecified trimester: Secondary | ICD-10-CM

## 2013-08-15 DIAGNOSIS — Z3483 Encounter for supervision of other normal pregnancy, third trimester: Secondary | ICD-10-CM

## 2013-08-15 DIAGNOSIS — Z3480 Encounter for supervision of other normal pregnancy, unspecified trimester: Secondary | ICD-10-CM

## 2013-08-15 LAB — POCT URINALYSIS DIPSTICK
Bilirubin, UA: NEGATIVE
Nitrite, UA: NEGATIVE
Urobilinogen, UA: NEGATIVE
pH, UA: 5

## 2013-08-15 NOTE — Progress Notes (Signed)
Pulse-100 No new complaints.

## 2013-08-16 ENCOUNTER — Other Ambulatory Visit: Payer: Self-pay | Admitting: *Deleted

## 2013-08-17 ENCOUNTER — Encounter: Payer: Self-pay | Admitting: Obstetrics & Gynecology

## 2013-08-17 LAB — US OB DETAIL + 14 WK

## 2013-08-20 ENCOUNTER — Ambulatory Visit (HOSPITAL_COMMUNITY)
Admission: RE | Admit: 2013-08-20 | Discharge: 2013-08-20 | Disposition: A | Discharge status: Home / Self Care | Payer: Medicaid Other | Source: Ambulatory Visit | Attending: Obstetrics & Gynecology | Admitting: Obstetrics & Gynecology

## 2013-08-20 VITALS — BP 124/71 | HR 98 | Wt 218.0 lb

## 2013-08-20 DIAGNOSIS — O352XX1 Maternal care for (suspected) hereditary disease in fetus, fetus 1: Secondary | ICD-10-CM

## 2013-08-20 DIAGNOSIS — O34219 Maternal care for unspecified type scar from previous cesarean delivery: Secondary | ICD-10-CM | POA: Insufficient documentation

## 2013-08-20 DIAGNOSIS — O26879 Cervical shortening, unspecified trimester: Secondary | ICD-10-CM

## 2013-08-20 DIAGNOSIS — Z8751 Personal history of pre-term labor: Secondary | ICD-10-CM | POA: Insufficient documentation

## 2013-08-20 DIAGNOSIS — F603 Borderline personality disorder: Secondary | ICD-10-CM

## 2013-08-20 DIAGNOSIS — Z98891 History of uterine scar from previous surgery: Secondary | ICD-10-CM

## 2013-08-20 DIAGNOSIS — F4310 Post-traumatic stress disorder, unspecified: Secondary | ICD-10-CM

## 2013-08-20 DIAGNOSIS — O3440 Maternal care for other abnormalities of cervix, unspecified trimester: Secondary | ICD-10-CM | POA: Insufficient documentation

## 2013-08-20 DIAGNOSIS — O280 Abnormal hematological finding on antenatal screening of mother: Secondary | ICD-10-CM

## 2013-08-20 DIAGNOSIS — F419 Anxiety disorder, unspecified: Secondary | ICD-10-CM

## 2013-08-20 DIAGNOSIS — O352XX0 Maternal care for (suspected) hereditary disease in fetus, not applicable or unspecified: Secondary | ICD-10-CM | POA: Insufficient documentation

## 2013-08-21 ENCOUNTER — Ambulatory Visit (INDEPENDENT_AMBULATORY_CARE_PROVIDER_SITE_OTHER): Payer: Medicaid Other | Admitting: Obstetrics

## 2013-08-21 ENCOUNTER — Ambulatory Visit: Payer: Medicaid Other

## 2013-08-21 ENCOUNTER — Encounter: Payer: Self-pay | Admitting: Obstetrics

## 2013-08-21 VITALS — BP 122/70 | Temp 97.5°F | Wt 219.0 lb

## 2013-08-21 DIAGNOSIS — Z3480 Encounter for supervision of other normal pregnancy, unspecified trimester: Secondary | ICD-10-CM

## 2013-08-21 DIAGNOSIS — J029 Acute pharyngitis, unspecified: Secondary | ICD-10-CM | POA: Insufficient documentation

## 2013-08-21 DIAGNOSIS — Z3483 Encounter for supervision of other normal pregnancy, third trimester: Secondary | ICD-10-CM

## 2013-08-21 DIAGNOSIS — R21 Rash and other nonspecific skin eruption: Secondary | ICD-10-CM | POA: Insufficient documentation

## 2013-08-21 DIAGNOSIS — R070 Pain in throat: Secondary | ICD-10-CM

## 2013-08-21 LAB — POCT URINALYSIS DIPSTICK
Bilirubin, UA: NEGATIVE
Nitrite, UA: NEGATIVE
pH, UA: 6

## 2013-08-21 MED ORDER — AMOXICILLIN-POT CLAVULANATE 875-125 MG PO TABS: 1.0000 | ORAL_TABLET | Freq: Two times a day (BID) | ORAL | Status: DC

## 2013-08-21 MED ORDER — HYDROCORTISONE 2.5 % EX CREA: TOPICAL_CREAM | Freq: Two times a day (BID) | CUTANEOUS | Status: DC

## 2013-08-21 NOTE — Progress Notes (Signed)
Pulse - 97.  Lower back pain.  Reports sore throat and cough for 3 days.  Rapid strep culture - negative.

## 2013-08-29 ENCOUNTER — Encounter: Payer: Medicaid Other | Admitting: Obstetrics & Gynecology

## 2013-09-03 ENCOUNTER — Encounter: Payer: Medicaid Other | Admitting: Obstetrics & Gynecology

## 2013-09-19 ENCOUNTER — Other Ambulatory Visit: Payer: Self-pay | Admitting: Obstetrics

## 2013-09-19 DIAGNOSIS — O352XX1 Maternal care for (suspected) hereditary disease in fetus, fetus 1: Secondary | ICD-10-CM

## 2013-09-23 ENCOUNTER — Inpatient Hospital Stay (HOSPITAL_COMMUNITY): Admission: AD | Admit: 2013-09-23 | Payer: Self-pay | Source: Ambulatory Visit | Admitting: Family Medicine

## 2013-09-24 ENCOUNTER — Ambulatory Visit (HOSPITAL_COMMUNITY): Admission: RE | Admit: 2013-09-24 | Payer: Medicaid Other | Source: Ambulatory Visit

## 2013-10-04 ENCOUNTER — Encounter: Payer: Self-pay | Admitting: Obstetrics & Gynecology

## 2013-10-11 DIAGNOSIS — F319 Bipolar disorder, unspecified: Secondary | ICD-10-CM | POA: Insufficient documentation

## 2014-02-07 IMAGING — US US OB FOLLOW-UP
1 series · 12 of 28 positions shown · non-contrast
Comparison: none

[Series 1: us ob follow-up · 0.23mm/px · 12 of 42 slices shown]
[im 2/42]
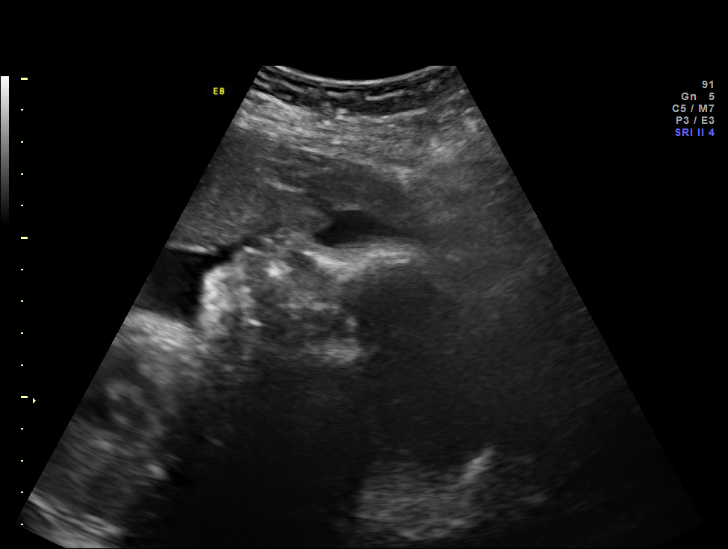
[im 5/42]
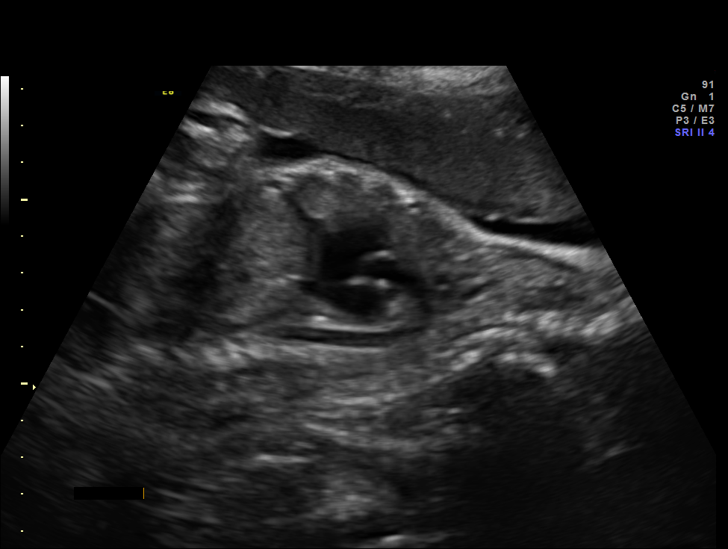
[im 8/42]
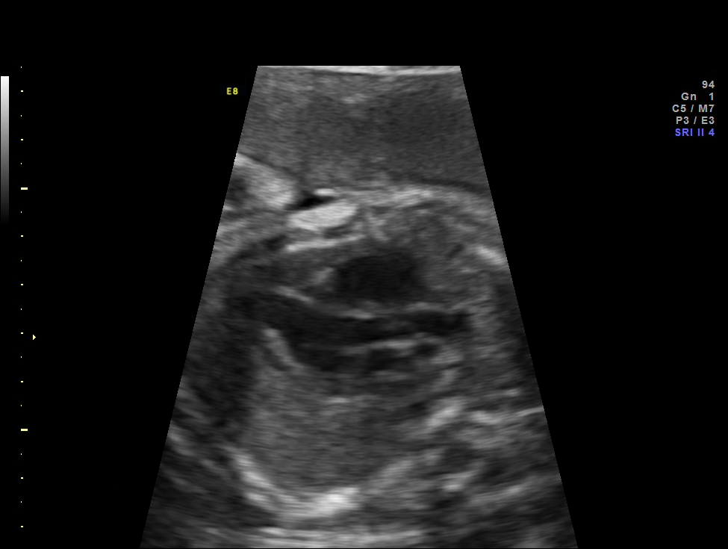
[im 13/42]
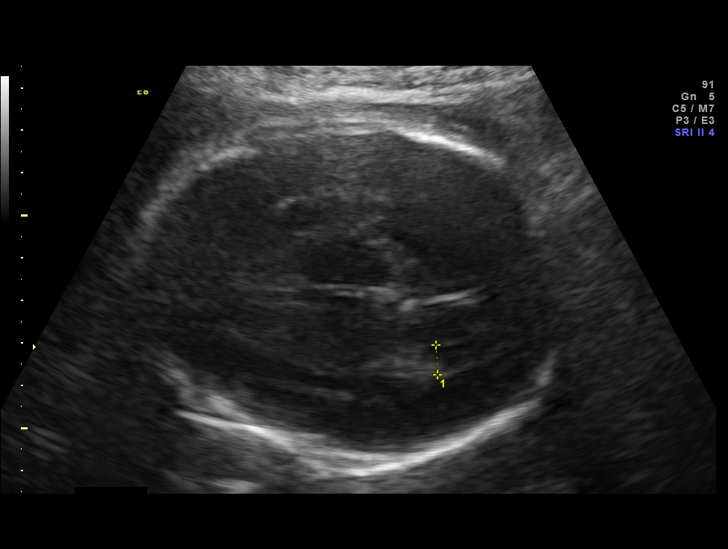
[im 16/42]
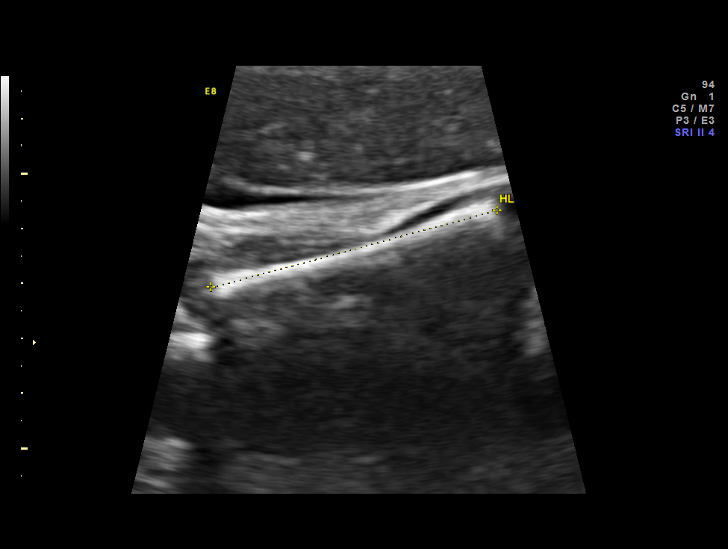
[im 19/42]
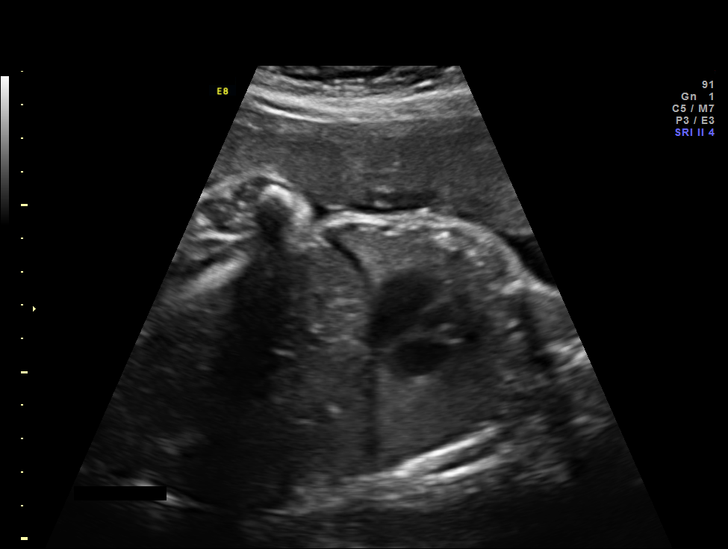
[im 23/42]
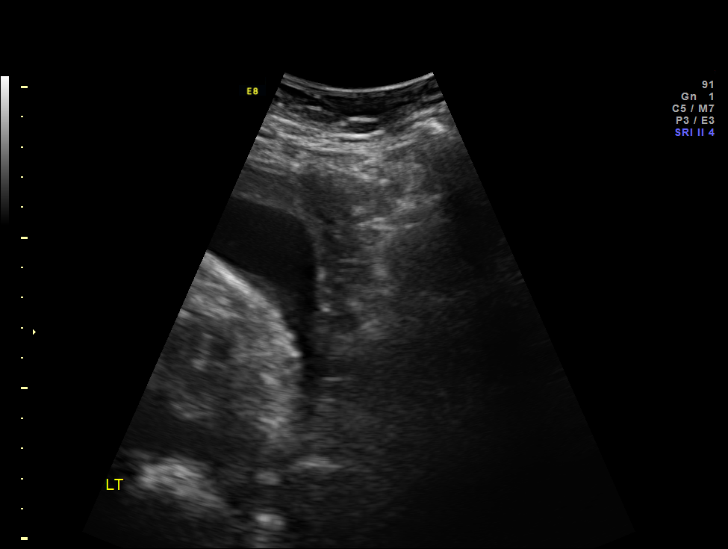
[im 26/42]
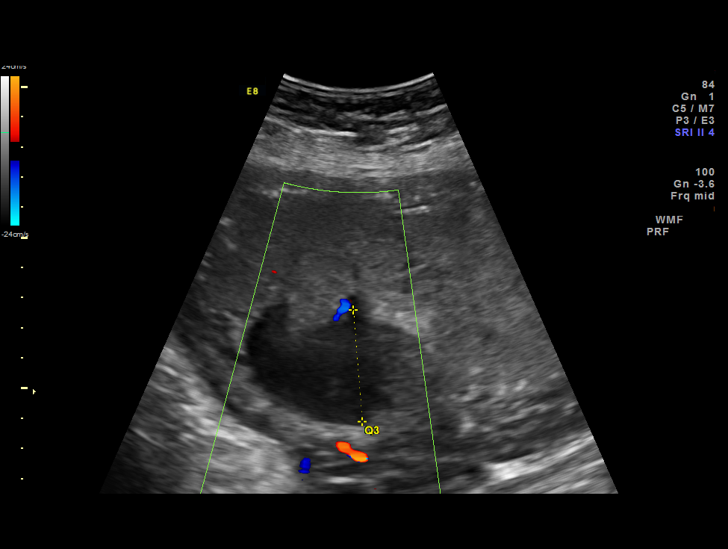
[im 29/42]
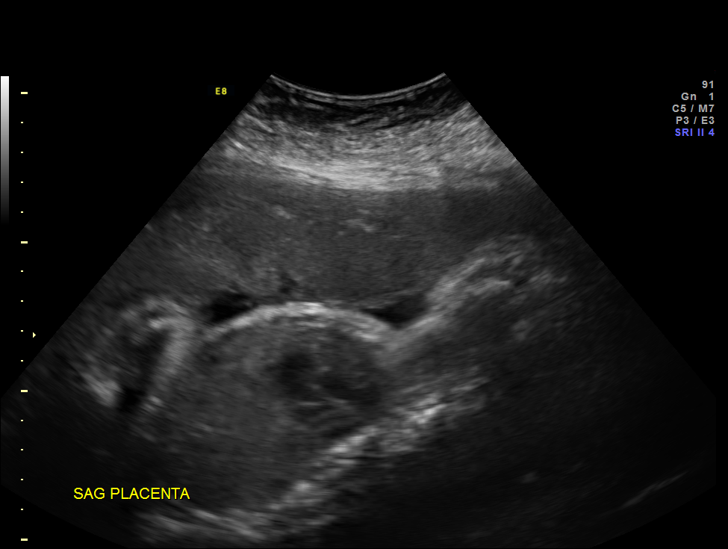
[im 34/42]
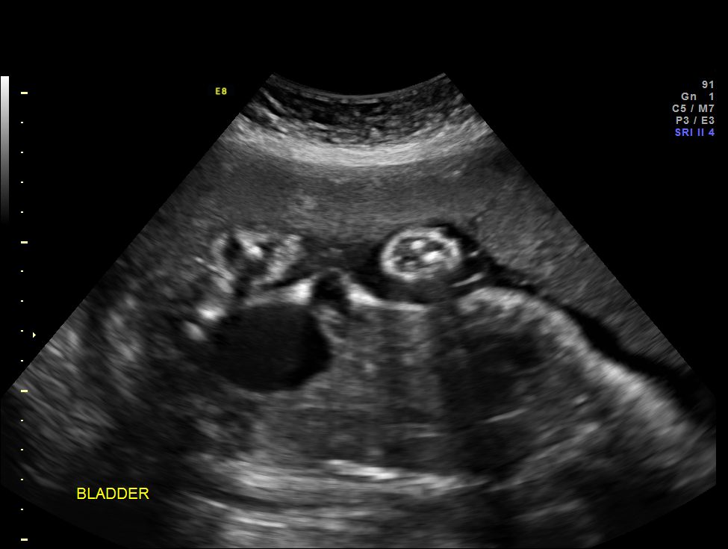
[im 37/42]
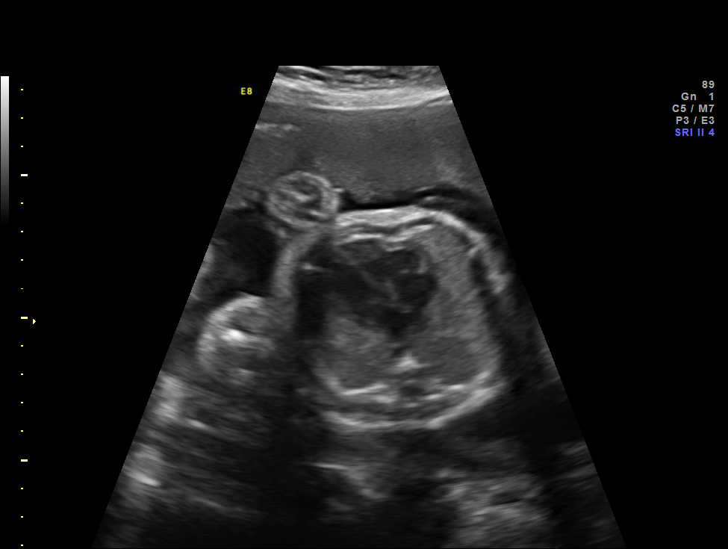
[im 40/42]
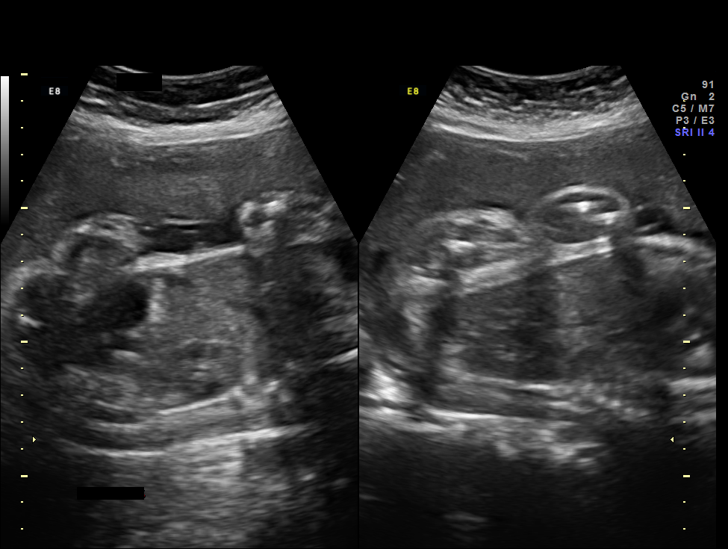

[12 of 28 positions shown; findings below may reference images not displayed]

OBSTETRICS REPORT
                      (Signed Final 08/20/2013 [DATE])

Service(s) Provided

 US OB FOLLOW UP                                       76816.1
Indications

 Poor obstetric history: Previous preterm delivery
 (24 weeks, daughter)
 History of genetic / anatomic abnormality - CHD
 (older son)
 History of genetic / anatomic abnormality - cleft lip
 & palate (younger son)
 History of cesarean delivery, currently pregnant      654.20,
 Previous cervical surgery (colposcopy)
 Anxiety, PTSD
Fetal Evaluation

 Num Of Fetuses:    1
 Fetal Heart Rate:  135                          bpm
 Cardiac Activity:  Observed
 Presentation:      Cephalic
 Placenta:          Anterior, above cervical os
 P. Cord            Previously Visualized
 Insertion:

 Amniotic Fluid
 AFI FV:      Subjectively within normal limits
 AFI Sum:     17.45   cm       64  %Tile     Larg Pckt:    5.05  cm
 RUQ:   4.82    cm   RLQ:    5.05   cm    LUQ:   3.86    cm   LLQ:    3.72   cm
Biometry

 BPD:     77.3  mm     G. Age:  31w 0d                CI:         76.1   70 - 86
 OFD:    101.6  mm                                    FL/HC:      21.1   19.1 -

 HC:     285.8  mm     G. Age:  31w 3d       13  %    HC/AC:      1.04   0.96 -

 AC:     274.7  mm     G. Age:  31w 4d       47  %    FL/BPD:     78.0   71 - 87
 FL:      60.3  mm     G. Age:  31w 2d       31  %    FL/AC:      22.0   20 - 24
 HUM:     54.3  mm     G. Age:  31w 4d       52  %

 Est. FW:    1703  gm    3 lb 14 oz      53  %
Gestational Age
 LMP:           30w 3d        Date:  01/19/13                 EDD:   10/26/13
 U/S Today:     31w 2d                                        EDD:   10/20/13
 Best:          31w 4d     Det. By:  Early Ultrasound         EDD:   10/18/13
                                     (03/06/13)
Anatomy

 Cranium:          Appears normal         Aortic Arch:      Appears normal
 Fetal Cavum:      Previously seen        Ductal Arch:      Appears normal
 Ventricles:       Appears normal         Diaphragm:        Previously seen
 Choroid Plexus:   Previously seen        Stomach:          Appears normal, left
                                                            sided
 Cerebellum:       Previously seen        Abdomen:          Appears normal
 Posterior Fossa:  Previously seen        Abdominal Wall:   Previously seen
 Nuchal Fold:      Not applicable (>20    Cord Vessels:     Previously seen
                   wks GA)
 Face:             Appears normal         Kidneys:          Appear normal
                   (orbits and profile)
 Lips:             Previously seen        Bladder:          Appears normal
 Palate:           Not well visualized    Spine:            Previously seen
 Heart:            Previously seen        Lower             Previously seen
                                          Extremities:
 RVOT:             Previously seen        Upper             Previously seen
                                          Extremities:
 LVOT:             Appears normal

 Other:  Female gender. Heels and 5th digit previously seen.
Cervix Uterus Adnexa

 Cervix:       Not visualized (advanced GA >65wks)
 Uterus:       No abnormality visualized.
 Cul De Sac:   No free fluid seen.
 Left Ovary:    Not visualized.
 Right Ovary:   Not visualized.
 Adnexa:     No abnormality visualized.
Impression

 IUP at 31+4 weeks
 Normal interval anatomy; anatomic survey complete
 Normal amniotic fluid volume
 Appropriate interval growth with EFW at the 53rd %tile
Recommendations

 Follow-up ultrasound for growth in 4-5 weeks (increased
 inhibin)

## 2014-03-26 ENCOUNTER — Encounter (HOSPITAL_COMMUNITY): Payer: Self-pay | Admitting: *Deleted

## 2014-06-18 ENCOUNTER — Emergency Department (HOSPITAL_COMMUNITY)
Admission: EM | Admit: 2014-06-18 | Discharge: 2014-06-18 | Disposition: A | Discharge status: Home / Self Care | Payer: Medicaid Other | Attending: Emergency Medicine | Admitting: Emergency Medicine

## 2014-06-18 ENCOUNTER — Encounter (HOSPITAL_COMMUNITY): Payer: Self-pay | Admitting: Emergency Medicine

## 2014-06-18 DIAGNOSIS — K089 Disorder of teeth and supporting structures, unspecified: Secondary | ICD-10-CM | POA: Diagnosis present

## 2014-06-18 DIAGNOSIS — K0889 Other specified disorders of teeth and supporting structures: Secondary | ICD-10-CM

## 2014-06-18 MED ORDER — HYDROCODONE-ACETAMINOPHEN 5-325 MG PO TABS
1.0000 | ORAL_TABLET | Freq: Once | ORAL | Status: AC
  Administered 2014-06-18: 1 via ORAL
  Filled 2014-06-18: qty 1

## 2014-06-18 MED ORDER — IBUPROFEN 800 MG PO TABS
800.0000 mg | ORAL_TABLET | Freq: Once | ORAL | Status: AC
  Administered 2014-06-18: 800 mg via ORAL
  Filled 2014-06-18: qty 1

## 2014-06-18 MED ORDER — IBUPROFEN 800 MG PO TABS: 800.0000 mg | ORAL_TABLET | Freq: Three times a day (TID) | ORAL | Status: DC | PRN

## 2014-06-18 MED ORDER — HYDROCODONE-ACETAMINOPHEN 5-325 MG PO TABS: 1.0000 | ORAL_TABLET | Freq: Four times a day (QID) | ORAL | Status: DC | PRN

## 2014-06-18 MED ORDER — PENICILLIN V POTASSIUM 500 MG PO TABS
500.0000 mg | ORAL_TABLET | Freq: Four times a day (QID) | ORAL | Status: DC
Start: 2014-06-18 — End: 2014-06-28

## 2014-06-18 NOTE — ED Notes (Signed)
Per EMS: Dental pain for months, worse over the past 3 days. Pt reports swelling to R jaw onset this morning. No swelling noted at this time. Pt doesn't have any OTC medications to take.

## 2014-06-18 NOTE — ED Provider Notes (Signed)
CSN: [id number]     Arrival date & time 06/18/14  1721 History   First MD Initiated Contact with Patient 06/18/14 1726     This chart was scribed for non-physician practitioner, Irena Cords PA-C working with Debby Freiberg, MD by Forrestine Him, ED Scribe. This patient was seen in room [place] and the patient's care was started at 5:34 PM.    Chief Complaint  Patient presents with  . Dental Pain   The history is provided by the patient. No language interpreter was used.    HPI Comments: Suzanne Klein brought in by EMS is a 33 y.o. female who presents to the Emergency Department complaining of constant, moderate dental pain onset several months. Pt state symptoms have recently worsened in the last 3 days. She has also noted some swelling to the R jaw. States dental pain is exacerbated with eating and drinking. She has not tried any OTC medications to help manage symptoms. No known allergies to medications. No other concerns this visit.  No past medical history on file. No past surgical history on file. No family history on file. History  Substance Use Topics  . Smoking status: Not on file  . Smokeless tobacco: Not on file  . Alcohol Use: Not on file   OB History   No data available     Review of Systems  Constitutional: Negative for chills.  HENT: Positive for dental problem. Negative for trouble swallowing.       Allergies  Review of patient's allergies indicates not on file.  Home Medications   Prior to Admission medications   Not on File   Triage Vitals: There were no vitals taken for this visit.   Physical Exam  Nursing note and vitals reviewed. Constitutional: She is oriented to person, place, and time. She appears well-developed and well-nourished.  HENT:  Head: Normocephalic.  Mouth/Throat:    Eyes: EOM are normal.  Neck: Normal range of motion.  Pulmonary/Chest: Effort normal.  Abdominal: She exhibits no distension.  Musculoskeletal: Normal range  of motion.  Neurological: She is alert and oriented to person, place, and time.  Psychiatric: She has a normal mood and affect.    ED Course  Procedures (including critical care time)  DIAGNOSTIC STUDIES:   COORDINATION OF CARE: 5:34 PM- Will give Norco and Ibuprofen to help manage symptoms. Discussed treatment plan with pt at bedside and pt agreed to plan.      I personally performed the services described in this documentation, which was scribed in my presence. The recorded information has been reviewed and is accurate.    Brent General, PA-C 06/18/14 1842

## 2014-06-18 NOTE — Discharge Instructions (Signed)
Return here as needed. Follow up with the dentist provided. Use warm compresses along the jaw.

## 2014-06-21 ENCOUNTER — Encounter (HOSPITAL_COMMUNITY): Payer: Self-pay | Admitting: *Deleted

## 2014-06-22 NOTE — ED Provider Notes (Signed)
Medical screening examination/treatment/procedure(s) were performed by non-physician practitioner and as supervising physician I was immediately available for consultation/collaboration.   EKG Interpretation None        Debby Freiberg, MD 06/22/14 [phone]

## 2014-06-28 ENCOUNTER — Encounter (HOSPITAL_COMMUNITY): Payer: Self-pay | Admitting: Emergency Medicine

## 2014-06-28 ENCOUNTER — Emergency Department (HOSPITAL_COMMUNITY)
Admission: EM | Admit: 2014-06-28 | Discharge: 2014-06-29 | Disposition: A | Discharge status: Home / Self Care | Payer: Medicaid Other | Attending: Emergency Medicine | Admitting: Emergency Medicine

## 2014-06-28 DIAGNOSIS — Z8739 Personal history of other diseases of the musculoskeletal system and connective tissue: Secondary | ICD-10-CM | POA: Diagnosis not present

## 2014-06-28 DIAGNOSIS — Z3202 Encounter for pregnancy test, result negative: Secondary | ICD-10-CM | POA: Insufficient documentation

## 2014-06-28 DIAGNOSIS — Z79899 Other long term (current) drug therapy: Secondary | ICD-10-CM | POA: Diagnosis not present

## 2014-06-28 DIAGNOSIS — Z91040 Latex allergy status: Secondary | ICD-10-CM | POA: Insufficient documentation

## 2014-06-28 DIAGNOSIS — R51 Headache: Secondary | ICD-10-CM | POA: Insufficient documentation

## 2014-06-28 DIAGNOSIS — M533 Sacrococcygeal disorders, not elsewhere classified: Secondary | ICD-10-CM | POA: Diagnosis present

## 2014-06-28 DIAGNOSIS — F4310 Post-traumatic stress disorder, unspecified: Secondary | ICD-10-CM | POA: Insufficient documentation

## 2014-06-28 DIAGNOSIS — R42 Dizziness and giddiness: Secondary | ICD-10-CM

## 2014-06-28 DIAGNOSIS — Z8619 Personal history of other infectious and parasitic diseases: Secondary | ICD-10-CM | POA: Diagnosis not present

## 2014-06-28 DIAGNOSIS — F410 Panic disorder [episodic paroxysmal anxiety] without agoraphobia: Secondary | ICD-10-CM | POA: Diagnosis not present

## 2014-06-28 DIAGNOSIS — F17200 Nicotine dependence, unspecified, uncomplicated: Secondary | ICD-10-CM | POA: Insufficient documentation

## 2014-06-28 DIAGNOSIS — R519 Headache, unspecified: Secondary | ICD-10-CM

## 2014-06-28 LAB — I-STAT CHEM 8, ED
BUN: 18 mg/dL (ref 6–23)
CREATININE: 0.6 mg/dL (ref 0.50–1.10)
Calcium, Ion: 1.08 mmol/L — ABNORMAL LOW (ref 1.12–1.23)
Chloride: 107 mEq/L (ref 96–112)
GLUCOSE: 93 mg/dL (ref 70–99)
HCT: 41 % (ref 36.0–46.0)
Hemoglobin: 13.9 g/dL (ref 12.0–15.0)
Potassium: 4.5 mEq/L (ref 3.7–5.3)
SODIUM: 138 meq/L (ref 137–147)
TCO2: 25 mmol/L (ref 0–100)

## 2014-06-28 LAB — CBG MONITORING, ED: GLUCOSE-CAPILLARY: 106 mg/dL — AB (ref 70–99)

## 2014-06-28 MED ORDER — SODIUM CHLORIDE 0.9 % IV BOLUS (SEPSIS): 1000.0000 mL | Freq: Once | INTRAVENOUS | Status: DC

## 2014-06-28 MED ORDER — IBUPROFEN 800 MG PO TABS
800.0000 mg | ORAL_TABLET | Freq: Once | ORAL | Status: AC
  Administered 2014-06-28: 800 mg via ORAL
  Filled 2014-06-28: qty 1

## 2014-06-28 MED ORDER — SODIUM CHLORIDE 0.9 % IV BOLUS (SEPSIS)
1000.0000 mL | Freq: Once | INTRAVENOUS | Status: AC
  Administered 2014-06-28: 1000 mL via INTRAVENOUS

## 2014-06-28 MED ORDER — ONDANSETRON 4 MG PO TBDP
4.0000 mg | ORAL_TABLET | Freq: Once | ORAL | Status: AC
  Administered 2014-06-28: 4 mg via ORAL
  Filled 2014-06-28: qty 1

## 2014-06-28 NOTE — ED Provider Notes (Signed)
TIME SEEN: 10:35 PM  CHIEF COMPLAINT: Tailbone pain, headaches, nausea, lightheaded  HPI: Patient is a 33 year old female with history of migraines, PTSD, borderline personality disorder, anxiety who presents to the emergency department multiple complaints. She states for 10 months she has had tailbone pain. Denies any injury. No lesions, erythema or warmth to this area.   She is also complaining of a diffuse, throbbing headache with nausea. No aggravating or relieving factors. No thunderclap or worst headache of her life. She's had similar headaches in the past.   She is also complaining of feeling lightheaded and is having vertigo today while she was at a bus stop. States that this is what happens when her blood sugar drops. She is nondiabetic. Blood glucose was EMS was 111. No numbness, tingling or focal weakness.  ROS: See HPI Constitutional: no fever  Eyes: no drainage  ENT: no runny nose   Cardiovascular:  no chest pain  Resp: no SOB  GI: no vomiting GU: no dysuria Integumentary: no rash  Allergy: no hives  Musculoskeletal: no leg swelling  Neurological: no slurred speech ROS otherwise negative  PAST MEDICAL HISTORY/PAST SURGICAL HISTORY:  Past Medical History  Diagnosis Date  . HPV in female   . Migraines   . HPV in female   . Abnormal Pap smear   . Termination of pregnancy     x 1  . PTSD (post-traumatic stress disorder)     No meds  . Borderline personality disorder     No meds  . Panic anxiety syndrome     no meds  . Heartburn in pregnancy   . Scoliosis of lumbar spine     MEDICATIONS:  Prior to Admission medications   Medication Sig Start Date End Date Taking? Authorizing Provider  acetaminophen (TYLENOL) 500 MG tablet Take 1,000 mg by mouth every 6 (six) hours as needed for pain.   Yes Historical Provider, MD  diphenhydramine-acetaminophen (TYLENOL PM) 25-500 MG TABS Take 1-2 tablets by mouth at bedtime as needed (for sleep).   Yes Historical Provider, MD   hydrOXYzine (VISTARIL) 25 MG capsule Take 25 mg by mouth 2 (two) times daily as needed.   Yes Historical Provider, MD  sertraline (ZOLOFT) 100 MG tablet Take 100 mg by mouth daily.   Yes Historical Provider, MD  HYDROcodone-acetaminophen (NORCO/VICODIN) 5-325 MG per tablet Take 1 tablet by mouth every 6 (six) hours as needed for moderate pain. 06/18/14   Resa Miner Lawyer, PA-C  ibuprofen (ADVIL,MOTRIN) 800 MG tablet Take 1 tablet (800 mg total) by mouth every 8 (eight) hours as needed. 06/18/14   Brent General, PA-C    ALLERGIES:  Allergies  Allergen Reactions  . Banana Nausea And Vomiting and Rash  . Latex Rash  . Poison Ivy Extract [Extract Of Poison Ivy] Rash  . Tomato Rash    SOCIAL HISTORY:  History  Substance Use Topics  . Smoking status: Current Every Day Smoker -- 0.50 packs/day for 13 years    Types: Cigarettes  . Smokeless tobacco: Not on file  . Alcohol Use: No    FAMILY HISTORY: Family History  Problem Relation Age of Onset  . Cancer Mother   . Heart disease Mother   . Anesthesia problems Neg Hx     EXAM: BP 122/78  Pulse 77  Temp(Src) 99 F (37.2 C) (Oral)  Resp 16  Ht 5\' 6"  (1.676 m)  Wt 210 lb (95.255 kg)  BMI 33.91 kg/m2  SpO2 98%  LMP 05/25/2014 CONSTITUTIONAL:  Alert and oriented and responds appropriately to questions. Well-appearing; well-nourished HEAD: Normocephalic EYES: Conjunctivae clear, PERRL ENT: normal nose; no rhinorrhea; moist mucous membranes; pharynx without lesions noted NECK: Supple, no meningismus, no LAD  CARD: RRR; S1 and S2 appreciated; no murmurs, no clicks, no rubs, no gallops RESP: Normal chest excursion without splinting or tachypnea; breath sounds clear and equal bilaterally; no wheezes, no rhonchi, no rales,  ABD/GI: Normal bowel sounds; non-distended; soft, non-tender, no rebound, no guarding BACK:  The back appears normal and is non-tender to palpation, there is no CVA tenderness, tender to palpation over her  sacrum without any assistive lesions or bony deformity or ecchymosis or swelling, no erythema or warmth, no induration or fluctuance, no crepitus EXT: Normal ROM in all joints; non-tender to palpation; no edema; normal capillary refill; no cyanosis    SKIN: Normal color for age and race; warm NEURO: Moves all extremities equally, sensation to light touch intact diffusely, cranial 2 through contact, normal gait PSYCH: The patient's mood and manner are appropriate. Grooming and personal hygiene are appropriate.  MEDICAL DECISION MAKING: Patient here with multiple complaints. She is repeatedly asking for pain medication, food, a taxi voucher to get back to the shelter and a note for her shelter. I do not suspect that there is any life-threatening illness today. Blood glucose is normal. EKG shows no ischemic changes or arrhythmia. We'll obtain basic labs, urine pregnancy to evaluate for possible anemia, electrolyte abnormality, pregnancy that could be causing her symptoms. Will give ibuprofen and Zofran, IV fluids. Anticipate discharge back to the homeless shelter.  ED PROGRESS: Labs are unremarkable. Urine pregnancy test negative. I feel she is safe to be discharged home. She is upset because we cannot provider with a taxi voucher to get back to her homeless shelter. Have discussed with her that we do not provide these for a while for long emergency department. Discussed this with her prior to any workup and prior to the buses not running. Her friend at bedside states he will finally back to the shelter. Discussed supportive care instructions and return precautions. She verbalizes understanding and is comfortable with plan.    EKG Interpretation  Date/Time:  Friday June 28 2014 22:47:47 EDT Ventricular Rate:  58 PR Interval:  164 QRS Duration: 99 QT Interval:  428 QTC Calculation: 420 R Axis:   64 Text Interpretation:  Sinus rhythm Borderline T abnormalities, anterior leads Confirmed by Daelen Belvedere,   DO, Demaria Deeney ([id number]) on 06/28/2014 10:49:42 PM        [place], DO 06/29/14 9937

## 2014-06-28 NOTE — ED Notes (Signed)
Attempted to cath pt, no urine came out. Pt states that she hasn't used the restroom since this morning.

## 2014-06-28 NOTE — ED Notes (Signed)
Notified RN,Autumn pt. CBG 106.

## 2014-06-28 NOTE — ED Notes (Signed)
[place], pt c/o tailbone pain x 6-8 months. Pt also c/o HA x 1130-1200 today.+ nausea. Pt states she does have hypoglycemia, pt CBG WNL.

## 2014-06-29 LAB — RAPID URINE DRUG SCREEN, HOSP PERFORMED
Amphetamines: NOT DETECTED
BARBITURATES: NOT DETECTED
Benzodiazepines: NOT DETECTED
Cocaine: NOT DETECTED
Opiates: NOT DETECTED
TETRAHYDROCANNABINOL: NOT DETECTED

## 2014-06-29 LAB — POC URINE PREG, ED: Preg Test, Ur: NEGATIVE

## 2014-06-29 NOTE — Discharge Instructions (Signed)
General Headache Without Cause A general headache is pain or discomfort felt around the head or neck area. The cause may not be found.  HOME CARE   Keep all doctor visits.  Only take medicines as told by your doctor.  Lie down in a dark, quiet room when you have a headache.  Keep a journal to find out if certain things bring on headaches. For example, write down:  What you eat and drink.  How much sleep you get.  Any change to your diet or medicines.  Relax by getting a massage or doing other relaxing activities.  Put ice or heat packs on the head and neck area as told by your doctor.  Lessen stress.  Sit up straight. Do not tighten (tense) your muscles.  Quit smoking if you smoke.  Lessen how much alcohol you drink.  Lessen how much caffeine you drink, or stop drinking caffeine.  Eat and sleep on a regular schedule.  Get 7 to 9 hours of sleep, or as told by your doctor.  Keep lights dim if bright lights bother you or make your headaches worse. GET HELP RIGHT AWAY IF:   Your headache becomes really bad.  You have a fever.  You have a stiff neck.  You have trouble seeing.  Your muscles are weak, or you lose muscle control.  You lose your balance or have trouble walking.  You feel like you will pass out (faint), or you pass out.  You have really bad symptoms that are different than your first symptoms.  You have problems with the medicines given to you by your doctor.  Your medicines do not work.  Your headache feels different than the other headaches.  You feel sick to your stomach (nauseous) or throw up (vomit). MAKE SURE YOU:   Understand these instructions.  Will watch your condition.  Will get help right away if you are not doing well or get worse. Document Released: 07/20/2008 Document Revised: 01/03/2012 Document Reviewed: 10/01/2011 [facility] Patient Information 2015 [place], [place]. This information is not intended to replace advice given to  you by your health care provider. Make sure you discuss any questions you have with your health care provider.  Dizziness Dizziness is a common problem. It is a feeling of unsteadiness or light-headedness. You may feel like you are about to faint. Dizziness can lead to injury if you stumble or fall. A person of any age group can suffer from dizziness, but dizziness is more common in older adults. CAUSES  Dizziness can be caused by many different things, including:  Middle ear problems.  Standing for too long.  Infections.  An allergic reaction.  Aging.  An emotional response to something, such as the sight of blood.  Side effects of medicines.  Tiredness.  Problems with circulation or blood pressure.  Excessive use of alcohol or medicines, or illegal drug use.  Breathing too fast (hyperventilation).  An irregular heart rhythm (arrhythmia).  A low red blood cell count (anemia).  Pregnancy.  Vomiting, diarrhea, fever, or other illnesses that cause body fluid loss (dehydration).  Diseases or conditions such as Parkinson's disease, high blood pressure (hypertension), diabetes, and thyroid problems.  Exposure to extreme heat. DIAGNOSIS  Your health care provider will ask about your symptoms, perform a physical exam, and perform an electrocardiogram (ECG) to record the electrical activity of your heart. Your health care provider may also perform other heart or blood tests to determine the cause of your dizziness. These may  include:  Transthoracic echocardiogram (TTE). During echocardiography, sound waves are used to evaluate how blood flows through your heart.  Transesophageal echocardiogram (TEE).  Cardiac monitoring. This allows your health care provider to monitor your heart rate and rhythm in real time.  Holter monitor. This is a portable device that records your heartbeat and can help diagnose heart arrhythmias. It allows your health care provider to track your heart  activity for several days if needed.  Stress tests by exercise or by giving medicine that makes the heart beat faster. TREATMENT  Treatment of dizziness depends on the cause of your symptoms and can vary greatly. HOME CARE INSTRUCTIONS   Drink enough fluids to keep your urine clear or pale yellow. This is especially important in very hot weather. In older adults, it is also important in cold weather.  Take your medicine exactly as directed if your dizziness is caused by medicines. When taking blood pressure medicines, it is especially important to get up slowly.  Rise slowly from chairs and steady yourself until you feel okay.  In the morning, first sit up on the side of the bed. When you feel okay, stand slowly while holding onto something until you know your balance is fine.  Move your legs often if you need to stand in one place for a long time. Tighten and relax your muscles in your legs while standing.  Have someone stay with you for 1-2 days if dizziness continues to be a problem. Do this until you feel you are well enough to stay alone. Have the person call your health care provider if he or she notices changes in you that are concerning.  Do not drive or use heavy machinery if you feel dizzy.  Do not drink alcohol. SEEK IMMEDIATE MEDICAL CARE IF:   Your dizziness or light-headedness gets worse.  You feel nauseous or vomit.  You have problems talking, walking, or using your arms, hands, or legs.  You feel weak.  You are not thinking clearly or you have trouble forming sentences. It may take a friend or family member to notice this.  You have chest pain, abdominal pain, shortness of breath, or sweating.  Your vision changes.  You notice any bleeding.  You have side effects from medicine that seems to be getting worse rather than better. MAKE SURE YOU:   Understand these instructions.  Will watch your condition.  Will get help right away if you are not doing well  or get worse. Document Released: 04/06/2001 Document Revised: 10/16/2013 Document Reviewed: 04/30/2011 [facility] Patient Information 2015 [place], [place]. This information is not intended to replace advice given to you by your health care provider. Make sure you discuss any questions you have with your health care provider.

## 2014-07-05 ENCOUNTER — Emergency Department (HOSPITAL_COMMUNITY)
Admission: EM | Admit: 2014-07-05 | Discharge: 2014-07-05 | Disposition: A | Discharge status: Home / Self Care | Payer: Medicaid Other | Attending: Emergency Medicine | Admitting: Emergency Medicine

## 2014-07-05 ENCOUNTER — Encounter (HOSPITAL_COMMUNITY): Payer: Self-pay | Admitting: Emergency Medicine

## 2014-07-05 ENCOUNTER — Emergency Department (HOSPITAL_COMMUNITY): Payer: Medicaid Other

## 2014-07-05 DIAGNOSIS — Z8679 Personal history of other diseases of the circulatory system: Secondary | ICD-10-CM | POA: Diagnosis not present

## 2014-07-05 DIAGNOSIS — N949 Unspecified condition associated with female genital organs and menstrual cycle: Secondary | ICD-10-CM | POA: Diagnosis not present

## 2014-07-05 DIAGNOSIS — Z202 Contact with and (suspected) exposure to infections with a predominantly sexual mode of transmission: Secondary | ICD-10-CM | POA: Insufficient documentation

## 2014-07-05 DIAGNOSIS — R1031 Right lower quadrant pain: Secondary | ICD-10-CM | POA: Diagnosis present

## 2014-07-05 DIAGNOSIS — F17200 Nicotine dependence, unspecified, uncomplicated: Secondary | ICD-10-CM | POA: Diagnosis not present

## 2014-07-05 DIAGNOSIS — M545 Low back pain, unspecified: Secondary | ICD-10-CM | POA: Insufficient documentation

## 2014-07-05 DIAGNOSIS — F410 Panic disorder [episodic paroxysmal anxiety] without agoraphobia: Secondary | ICD-10-CM | POA: Insufficient documentation

## 2014-07-05 DIAGNOSIS — N898 Other specified noninflammatory disorders of vagina: Secondary | ICD-10-CM | POA: Insufficient documentation

## 2014-07-05 DIAGNOSIS — Z91040 Latex allergy status: Secondary | ICD-10-CM | POA: Diagnosis not present

## 2014-07-05 DIAGNOSIS — Z8619 Personal history of other infectious and parasitic diseases: Secondary | ICD-10-CM | POA: Diagnosis not present

## 2014-07-05 DIAGNOSIS — Z3202 Encounter for pregnancy test, result negative: Secondary | ICD-10-CM | POA: Insufficient documentation

## 2014-07-05 DIAGNOSIS — Z79899 Other long term (current) drug therapy: Secondary | ICD-10-CM | POA: Diagnosis not present

## 2014-07-05 DIAGNOSIS — Z711 Person with feared health complaint in whom no diagnosis is made: Secondary | ICD-10-CM

## 2014-07-05 DIAGNOSIS — R102 Pelvic and perineal pain: Secondary | ICD-10-CM

## 2014-07-05 DIAGNOSIS — F4310 Post-traumatic stress disorder, unspecified: Secondary | ICD-10-CM | POA: Diagnosis not present

## 2014-07-05 LAB — URINALYSIS, ROUTINE W REFLEX MICROSCOPIC
BILIRUBIN URINE: NEGATIVE
Glucose, UA: NEGATIVE mg/dL
HGB URINE DIPSTICK: NEGATIVE
KETONES UR: NEGATIVE mg/dL
NITRITE: NEGATIVE
PH: 5.5 (ref 5.0–8.0)
Protein, ur: NEGATIVE mg/dL
SPECIFIC GRAVITY, URINE: 1.023 (ref 1.005–1.030)
Urobilinogen, UA: 0.2 mg/dL (ref 0.0–1.0)

## 2014-07-05 LAB — CBC WITH DIFFERENTIAL/PLATELET
Basophils Absolute: 0 10*3/uL (ref 0.0–0.1)
Basophils Relative: 0 % (ref 0–1)
Eosinophils Absolute: 0.2 10*3/uL (ref 0.0–0.7)
Eosinophils Relative: 2 % (ref 0–5)
HEMATOCRIT: 37.9 % (ref 36.0–46.0)
HEMOGLOBIN: 12.7 g/dL (ref 12.0–15.0)
LYMPHS PCT: 33 % (ref 12–46)
Lymphs Abs: 3.2 10*3/uL (ref 0.7–4.0)
MCH: 31 pg (ref 26.0–34.0)
MCHC: 33.5 g/dL (ref 30.0–36.0)
MCV: 92.4 fL (ref 78.0–100.0)
MONO ABS: 0.6 10*3/uL (ref 0.1–1.0)
MONOS PCT: 6 % (ref 3–12)
Neutro Abs: 5.7 10*3/uL (ref 1.7–7.7)
Neutrophils Relative %: 59 % (ref 43–77)
Platelets: 296 10*3/uL (ref 150–400)
RBC: 4.1 MIL/uL (ref 3.87–5.11)
RDW: 14.1 % (ref 11.5–15.5)
WBC: 9.6 10*3/uL (ref 4.0–10.5)

## 2014-07-05 LAB — POC URINE PREG, ED: Preg Test, Ur: NEGATIVE

## 2014-07-05 LAB — BASIC METABOLIC PANEL
Anion gap: 12 (ref 5–15)
BUN: 17 mg/dL (ref 6–23)
CALCIUM: 9.6 mg/dL (ref 8.4–10.5)
CHLORIDE: 102 meq/L (ref 96–112)
CO2: 25 meq/L (ref 19–32)
Creatinine, Ser: 0.65 mg/dL (ref 0.50–1.10)
GFR calc Af Amer: >90 mL/min (ref >90)
GFR calc non Af Amer: >90 mL/min (ref >90)
GLUCOSE: 95 mg/dL (ref 70–99)
Potassium: 4.4 mEq/L (ref 3.7–5.3)
Sodium: 139 mEq/L (ref 137–147)

## 2014-07-05 LAB — URINE MICROSCOPIC-ADD ON

## 2014-07-05 LAB — WET PREP, GENITAL
TRICH WET PREP: NONE SEEN
Yeast Wet Prep HPF POC: NONE SEEN

## 2014-07-05 MED ORDER — PROMETHAZINE HCL 25 MG PO TABS: 25.0000 mg | ORAL_TABLET | Freq: Four times a day (QID) | ORAL | Status: DC | PRN

## 2014-07-05 MED ORDER — CEFTRIAXONE SODIUM 250 MG IJ SOLR
250.0000 mg | Freq: Once | INTRAMUSCULAR | Status: AC
  Administered 2014-07-05: 250 mg via INTRAMUSCULAR
  Filled 2014-07-05: qty 250

## 2014-07-05 MED ORDER — DOXYCYCLINE HYCLATE 100 MG PO CAPS: 100.0000 mg | ORAL_CAPSULE | Freq: Two times a day (BID) | ORAL | Status: DC

## 2014-07-05 MED ORDER — METRONIDAZOLE 500 MG PO TABS
2000.0000 mg | ORAL_TABLET | Freq: Once | ORAL | Status: AC
  Administered 2014-07-05: 2000 mg via ORAL
  Filled 2014-07-05: qty 4

## 2014-07-05 MED ORDER — AZITHROMYCIN 250 MG PO TABS
1000.0000 mg | ORAL_TABLET | Freq: Once | ORAL | Status: AC
  Administered 2014-07-05: 1000 mg via ORAL
  Filled 2014-07-05: qty 4

## 2014-07-05 MED ORDER — MORPHINE SULFATE 4 MG/ML IJ SOLN
4.0000 mg | Freq: Once | INTRAMUSCULAR | Status: AC
  Administered 2014-07-05: 4 mg via INTRAVENOUS
  Filled 2014-07-05: qty 1

## 2014-07-05 MED ORDER — KETOROLAC TROMETHAMINE 30 MG/ML IJ SOLN
30.0000 mg | Freq: Once | INTRAMUSCULAR | Status: AC
  Administered 2014-07-05: 30 mg via INTRAVENOUS
  Filled 2014-07-05: qty 1

## 2014-07-05 MED ORDER — ONDANSETRON HCL 4 MG/2ML IJ SOLN
4.0000 mg | Freq: Once | INTRAMUSCULAR | Status: AC
  Administered 2014-07-05: 4 mg via INTRAVENOUS
  Filled 2014-07-05: qty 2

## 2014-07-05 MED ORDER — SODIUM CHLORIDE 0.9 % IV SOLN
INTRAVENOUS | Status: DC
  Administered 2014-07-05: 21:00:00 via INTRAVENOUS

## 2014-07-05 MED ORDER — TRAMADOL HCL 50 MG PO TABS: 50.0000 mg | ORAL_TABLET | Freq: Four times a day (QID) | ORAL | Status: DC | PRN

## 2014-07-05 NOTE — ED Provider Notes (Signed)
CSN: [id number]     Arrival date & time 07/05/14  1838 History   First MD Initiated Contact with Patient 07/05/14 1839     Chief Complaint  Patient presents with  . Abdominal Pain  . Vaginal Discharge     (Consider location/radiation/quality/duration/timing/severity/associated sxs/prior Treatment) HPI  Patient presents to the emergency department by EMS for evaluation of low back pain, lower abdominal pain and foul-smelling vaginal discharge. She reports that the discharge looks like pus coming from her vagina and that her ex-boyfriend told her yesterday that she probably has gonorrhea since he was recently diagnosed with that. Her back pain has been persisting for the past 4 months and she has been seen here for the same but progressively getting worse and says that she has fallen on it a few times. She denies having dysuria, hematuria, nausea, vomiting, fevers, chest pain, and myalgias, weakness. Patient is homeless and lives at [facility].  Past Medical History  Diagnosis Date  . HPV in female   . Migraines   . HPV in female   . Abnormal Pap smear   . Termination of pregnancy     x 1  . PTSD (post-traumatic stress disorder)     No meds  . Borderline personality disorder     No meds  . Panic anxiety syndrome     no meds  . Heartburn in pregnancy   . Scoliosis of lumbar spine    Past Surgical History  Procedure Laterality Date  . Cesarean section      x 2  . Colposcopy    . Wisdom tooth extraction    . Dilation and curettage of uterus    . Cesarean section  07/21/2012    Procedure: CESAREAN SECTION;  Surgeon: Frederico Hamman, MD;  Location: [facility];  Service: Obstetrics;  Laterality: N/A;  Repeat Cesarean Section Delivery Boy @ [phone], Apgars 9/9  . Cesarean section     Family History  Problem Relation Age of Onset  . Cancer Mother   . Heart disease Mother   . Anesthesia problems Neg Hx    History  Substance Use Topics  . Smoking status: Current Every Day Smoker  -- 0.50 packs/day for 13 years    Types: Cigarettes  . Smokeless tobacco: Not on file  . Alcohol Use: No   OB History   Grav Para Term Preterm Abortions TAB SAB Ect Mult Living   5 3 2 1 1 1  0 0 0 3     Review of Systems   Review of Systems  Gen: no weight loss, fevers, chills, night sweats  Eyes: no occular draining, occular pain,  No visual changes  Nose: no epistaxis or rhinorrhea  Mouth: no dental pain, no sore throat  Neck: no neck pain  Lungs: No hemoptysis. No wheezing or coughing CV:  No palpitations, dependent edema or orthopnea. No chest pain Abd: no diarrhea. No nausea or vomiting, + abdominal pain and low back pain GU: no dysuria or gross hematuria  +vaginal discharge MSK:  No muscle weakness, No muscular pain Neuro: no headache, no focal neurologic deficits  Skin: no rash , no wounds Psyche: no complaints of depression or anxiety    Allergies  Banana; Latex; Poison ivy extract; and Tomato  Home Medications   Prior to Admission medications   Medication Sig Start Date End Date Taking? Authorizing Provider  diphenhydramine-acetaminophen (TYLENOL PM) 25-500 MG TABS Take 1-2 tablets by mouth at bedtime as needed (for sleep).  Yes Historical Provider, MD  hydrOXYzine (VISTARIL) 25 MG capsule Take 25 mg by mouth 2 (two) times daily as needed.   Yes Historical Provider, MD  sertraline (ZOLOFT) 100 MG tablet Take 100 mg by mouth daily.   Yes Historical Provider, MD  promethazine (PHENERGAN) 25 MG tablet Take 1 tablet (25 mg total) by mouth every 6 (six) hours as needed for nausea or vomiting. 07/05/14   Zaccai Chavarin Marilu Favre, PA-C  traMADol (ULTRAM) 50 MG tablet Take 1 tablet (50 mg total) by mouth every 6 (six) hours as needed. 07/05/14   Kamie Korber Marilu Favre, PA-C   BP 104/50  Pulse 69  Temp(Src) 98.3 F (36.8 C) (Oral)  Resp 16  SpO2 99%  LMP 05/25/2014 Physical Exam  Nursing note and vitals reviewed. Constitutional: She appears well-developed and well-nourished. No  distress.  HENT:  Head: Normocephalic and atraumatic.  Eyes: Pupils are equal, round, and reactive to light.  Neck: Normal range of motion. Neck supple.  Cardiovascular: Normal rate and regular rhythm.   Pulmonary/Chest: Effort normal.  Abdominal: Soft. Bowel sounds are normal. She exhibits no shifting dullness and no distension. There is tenderness in the right lower quadrant, suprapubic area and left lower quadrant. There is guarding (voluntary). There is no rigidity, no rebound and no CVA tenderness.  Genitourinary: There is tenderness around the vagina. Vaginal discharge found.  Neurological: She is alert.  Skin: Skin is warm and dry.    ED Course  Procedures (including critical care time) Labs Review Labs Reviewed  WET PREP, GENITAL - Abnormal; Notable for the following:    Clue Cells Wet Prep HPF POC RARE (*)    WBC, Wet Prep HPF POC RARE (*)    All other components within normal limits  URINALYSIS, ROUTINE W REFLEX MICROSCOPIC - Abnormal; Notable for the following:    APPearance CLOUDY (*)    Leukocytes, UA SMALL (*)    All other components within normal limits  URINE MICROSCOPIC-ADD ON - Abnormal; Notable for the following:    Bacteria, UA FEW (*)    All other components within normal limits  GC/CHLAMYDIA PROBE AMP  BASIC METABOLIC PANEL  CBC WITH DIFFERENTIAL  HIV ANTIBODY (ROUTINE TESTING)  RPR  POC URINE PREG, ED    Imaging Review Dg Sacrum/coccyx  07/05/2014   CLINICAL DATA:  Lower back and sacrococcygeal pain for several months.  EXAM: SACRUM AND COCCYX - 2+ VIEW  COMPARISON:  None.  FINDINGS: The sacrum and coccyx appear grossly intact. The sacroiliac joints are unremarkable in appearance. There is no evidence of fracture or dislocation. The lower lumbar spine is within normal limits. The visualized bowel gas pattern is grossly unremarkable.  IMPRESSION: No evidence of fracture or dislocation.   Electronically Signed   By: Garald Balding M.D.   On: 07/05/2014 22:58    Dg Abd 2 Views  07/05/2014   CLINICAL DATA:  Abdominal pain.  EXAM: ABDOMEN - 2 VIEW  COMPARISON:  None.  FINDINGS: The lung bases are clear.  The abdominal bowel gas pattern is unremarkable. No findings for obstruction or perforation. The soft tissue shadows are maintained. No worrisome calcifications. The bony structures are intact.  IMPRESSION: Unremarkable abdominal radiographs.   Electronically Signed   By: Kalman Jewels M.D.   On: 07/05/2014 22:58     EKG Interpretation None      MDM   Final diagnoses:  Pelvic pain in female  Concern about STD in female without diagnosis    Patient with  complaints of "puss from her vagina" with abdominal pain (suprapubic), low back pain (coccyx) and vaginal discharge. She was seen byan EDP 6 days ago for headaches, nausea, tailbone pain and lightheadedness.  She was seen on 06/18/2014 by an EDP for dental pain. On physical exam her abdomen is soft. She has some uterine tenderness on exam but no adnexal tenderness. Pain easily treated with IV pain medications. She reports sleeping with a partner that has gonorrhea and reports puss coming from her vagina. She did have foul smell on exam. Wet prep was not impressive but I have still treated like PID in ED. Will give rx for doxycycline, as well as pain medications. Considered torsion but pain is bilateral and she does have discharge and foul smell. I do not fell like this is as likely.  Medications  0.9 %  sodium chloride infusion ( Intravenous Stopped 07/05/14 2148)  ondansetron (ZOFRAN) injection 4 mg (4 mg Intravenous Given 07/05/14 2035)  morphine 4 MG/ML injection 4 mg (4 mg Intravenous Given 07/05/14 2037)  ketorolac (TORADOL) 30 MG/ML injection 30 mg (30 mg Intravenous Given 07/05/14 2227)  cefTRIAXone (ROCEPHIN) injection 250 mg (250 mg Intramuscular Given 07/05/14 2240)  azithromycin (ZITHROMAX) tablet 1,000 mg (1,000 mg Oral Given 07/05/14 2231)  metroNIDAZOLE (FLAGYL) tablet 2,000 mg (2,000 mg  Oral Given 07/05/14 2230)   [facility]  33 y.o.Suzanne Klein's evaluation in the Emergency Department is complete. It has been determined that no acute conditions requiring further emergency intervention are present at this time. The patient/guardian have been advised of the diagnosis and plan. We have discussed signs and symptoms that warrant return to the ED, such as changes or worsening in symptoms.  Vital signs are stable at discharge. Filed Vitals:   07/05/14 2240  BP: 104/50  Pulse: 69  Temp: 98.3 F (36.8 C)  Resp: 16    Patient/guardian has voiced understanding and agreed to follow-up with the PCP or specialist.      Linus Mako, PA-C 07/05/14 2320

## 2014-07-05 NOTE — Discharge Instructions (Signed)

## 2014-07-05 NOTE — ED Provider Notes (Signed)
Medical screening examination/treatment/procedure(s) were performed by non-physician practitioner and as supervising physician I was immediately available for consultation/collaboration.   EKG Interpretation None       Jasper Riling. Alvino Chapel, MD 07/05/14 2355

## 2014-07-05 NOTE — ED Notes (Signed)
Bed: [facility] Expected date:  Expected time:  Means of arrival:  Comments: [id number]

## 2014-07-05 NOTE — ED Notes (Signed)
PA Greene at bedside. 

## 2014-07-05 NOTE — ED Notes (Signed)
Pt BIB EMS. Pt c/o lower abdominal pain, foul smelling urine and vaginal discharge. Pt is from [facility] and reports being homeless. Pt also c/o lower back pain for the past 4 months.

## 2014-07-06 LAB — GC/CHLAMYDIA PROBE AMP
CT Probe RNA: NEGATIVE
GC Probe RNA: POSITIVE — AB

## 2014-07-06 LAB — HIV ANTIBODY (ROUTINE TESTING W REFLEX): HIV: NONREACTIVE

## 2014-07-06 LAB — RPR

## 2014-07-07 ENCOUNTER — Telehealth (HOSPITAL_BASED_OUTPATIENT_CLINIC_OR_DEPARTMENT_OTHER): Payer: Self-pay | Admitting: Emergency Medicine

## 2014-07-07 NOTE — Telephone Encounter (Signed)
Positive Gonorrhea culture Treated with Rocephin and Zithromax [facility] faxed Will contact patient  07-07-14 @ 1159 left voicemail for patient to return call to flow managers #

## 2014-07-11 ENCOUNTER — Telehealth (HOSPITAL_COMMUNITY): Payer: Self-pay

## 2014-07-11 NOTE — ED Notes (Signed)
Unable to reach by telephone. Letter sent to address on record.  

## 2014-07-15 ENCOUNTER — Telehealth (HOSPITAL_BASED_OUTPATIENT_CLINIC_OR_DEPARTMENT_OTHER): Payer: Self-pay | Admitting: Emergency Medicine

## 2014-08-01 ENCOUNTER — Emergency Department (HOSPITAL_COMMUNITY)
Admission: EM | Admit: 2014-08-01 | Discharge: 2014-08-01 | Disposition: A | Discharge status: Home / Self Care | Payer: Medicaid Other | Attending: Emergency Medicine | Admitting: Emergency Medicine

## 2014-08-01 ENCOUNTER — Encounter (HOSPITAL_COMMUNITY): Payer: Self-pay | Admitting: Emergency Medicine

## 2014-08-01 DIAGNOSIS — Z792 Long term (current) use of antibiotics: Secondary | ICD-10-CM | POA: Diagnosis not present

## 2014-08-01 DIAGNOSIS — M25561 Pain in right knee: Secondary | ICD-10-CM | POA: Diagnosis present

## 2014-08-01 DIAGNOSIS — M545 Low back pain: Secondary | ICD-10-CM | POA: Diagnosis not present

## 2014-08-01 DIAGNOSIS — Z720 Tobacco use: Secondary | ICD-10-CM | POA: Diagnosis not present

## 2014-08-01 DIAGNOSIS — Z79899 Other long term (current) drug therapy: Secondary | ICD-10-CM | POA: Insufficient documentation

## 2014-08-01 DIAGNOSIS — Z91040 Latex allergy status: Secondary | ICD-10-CM | POA: Diagnosis not present

## 2014-08-01 DIAGNOSIS — Z8619 Personal history of other infectious and parasitic diseases: Secondary | ICD-10-CM | POA: Insufficient documentation

## 2014-08-01 DIAGNOSIS — F410 Panic disorder [episodic paroxysmal anxiety] without agoraphobia: Secondary | ICD-10-CM | POA: Insufficient documentation

## 2014-08-01 DIAGNOSIS — G8929 Other chronic pain: Secondary | ICD-10-CM | POA: Diagnosis not present

## 2014-08-01 DIAGNOSIS — M25562 Pain in left knee: Secondary | ICD-10-CM | POA: Insufficient documentation

## 2014-08-01 DIAGNOSIS — Z8679 Personal history of other diseases of the circulatory system: Secondary | ICD-10-CM | POA: Insufficient documentation

## 2014-08-01 MED ORDER — TRAMADOL HCL 50 MG PO TABS: 50.0000 mg | ORAL_TABLET | Freq: Four times a day (QID) | ORAL | Status: DC | PRN

## 2014-08-01 NOTE — ED Provider Notes (Signed)
CSN: [id number]     Arrival date & time 08/01/14  1253 History  This chart was scribed for non-physician practitioner Domenic Moras, working with [name], MD by Donato Schultz, ED Scribe. This patient was seen in room TR07C/TR07C and the patient's care was started at 2:03 PM.    Chief Complaint  Patient presents with  . Foot Pain  . Leg Pain   Patient is a 33 y.o. female presenting with lower extremity pain and leg pain. The history is provided by the patient. No language interpreter was used.  Foot Pain  Leg Pain  HPI Comments: Suzanne Klein is a 33 y.o. female with a history of scoliosis with associated chronic lower back pain and hypoglycemia who presents to the Emergency Department complaining of gradually worsening joint pain in her knees, ankles and feet bilaterally and lower back pain.  She has noticed swelling in her knees bilaterally - right worse than left - and knots on both of her knees.  She states that she has always complained of joint pain however this current episode has been worsening over the past 2 months but over the past two days that pain has become more severe and unbearable.  She has taken Tylenol arthritis medication with no relief to her symptoms.  She has experienced these symptoms in the past and will take a hot bath with relief to her symptoms.  She was recently prescribed Tramadol for her pain and experienced relief to her symptoms.  She has since run out of medication. She has not participated in any new activities but walks daily for exercise.   She denies numbness in her legs, constipation, and incontinence as associated symptoms.  She has a history of gait problems due to her being "knock-kneed".  She has never been treated for her scoliosis.  She has an appointment at [facility] on Monday.  She has not seen her PCP for her symptoms.  She walks regularly for exercise.  She has no history of IV drug use or cancer.  She checks her blood sugar daily.     Past Medical History  Diagnosis Date  . HPV in female   . Migraines   . HPV in female   . Abnormal Pap smear   . Termination of pregnancy     x 1  . PTSD (post-traumatic stress disorder)     No meds  . Borderline personality disorder     No meds  . Panic anxiety syndrome     no meds  . Heartburn in pregnancy   . Scoliosis of lumbar spine    Past Surgical History  Procedure Laterality Date  . Cesarean section      x 2  . Colposcopy    . Wisdom tooth extraction    . Dilation and curettage of uterus    . Cesarean section  07/21/2012    Procedure: CESAREAN SECTION;  Surgeon: Frederico Hamman, MD;  Location: [facility];  Service: Obstetrics;  Laterality: N/A;  Repeat Cesarean Section Delivery Boy @ [phone], Apgars 9/9  . Cesarean section     Family History  Problem Relation Age of Onset  . Cancer Mother   . Heart disease Mother   . Anesthesia problems Neg Hx    History  Substance Use Topics  . Smoking status: Current Every Day Smoker -- 0.50 packs/day for 13 years    Types: Cigarettes  . Smokeless tobacco: Not on file  . Alcohol Use: No   OB History  Grav Para Term Preterm Abortions TAB SAB Ect Mult Living   5 3 2 1 1 1  0 0 0 3     Review of Systems  Gastrointestinal: Negative for diarrhea and constipation.  Musculoskeletal: Positive for arthralgias and joint swelling.  All other systems reviewed and are negative.     Allergies  Banana; Latex; Poison ivy extract; and Tomato  Home Medications   Prior to Admission medications   Medication Sig Start Date End Date Taking? Authorizing Provider  diphenhydramine-acetaminophen (TYLENOL PM) 25-500 MG TABS Take 1-2 tablets by mouth at bedtime as needed (for sleep).    Historical Provider, MD  doxycycline (VIBRAMYCIN) 100 MG capsule Take 1 capsule (100 mg total) by mouth 2 (two) times daily. 07/05/14   Tiffany Marilu Favre, PA-C  hydrOXYzine (VISTARIL) 25 MG capsule Take 25 mg by mouth 2 (two) times daily as needed.     Historical Provider, MD  promethazine (PHENERGAN) 25 MG tablet Take 1 tablet (25 mg total) by mouth every 6 (six) hours as needed for nausea or vomiting. 07/05/14   Linus Mako, PA-C  sertraline (ZOLOFT) 100 MG tablet Take 100 mg by mouth daily.    Historical Provider, MD  traMADol (ULTRAM) 50 MG tablet Take 1 tablet (50 mg total) by mouth every 6 (six) hours as needed. 07/05/14   Tiffany Marilu Favre, PA-C   BP 107/64  Pulse 61  Temp(Src) 97.4 F (36.3 C) (Oral)  Resp 16  SpO2 98%  LMP 07/25/2014 Physical Exam  Nursing note and vitals reviewed. Constitutional: She is oriented to person, place, and time. She appears well-developed and well-nourished.  HENT:  Head: Normocephalic and atraumatic.  Eyes: EOM are normal.  Neck: Normal range of motion.  Cardiovascular: Normal rate.   Pulmonary/Chest: Effort normal.  Musculoskeletal: Normal range of motion.  No joint laxity in the left knee.  Moderate tenderness noted bilaterally to the anterior knee upon palpation.  No effusion noted to the knees bilaterally.  Neurological: She is alert and oriented to person, place, and time. Coordination normal.  Patient walks with a limp but sensation is intact with normal skin tone.  Skin: Skin is warm and dry.  Psychiatric: She has a normal mood and affect. Her behavior is normal.    ED Course  Procedures (including critical care time)  DIAGNOSTIC STUDIES: Oxygen Saturation is 98% on room air, normal by my interpretation.    COORDINATION OF CARE: 2:10 PM-acute on chronic bilat knee pain likely 2/2 scoliosis of back.  Pt is NVI.  No evidence to suggest infection.  Able to ambulate.  Discussed discharging the patient with a prescription for Tramadol and the patient agreed to the treatment plan.   Labs Review Labs Reviewed - No data to display  Imaging Review No results found.   EKG Interpretation None      MDM   Final diagnoses:  Bilateral anterior knee pain    BP 107/64  Pulse 61   Temp(Src) 97.4 F (36.3 C) (Oral)  Resp 16  SpO2 98%  LMP 07/25/2014  I personally performed the services described in this documentation, which was scribed in my presence. The recorded information has been reviewed and is accurate.     Domenic Moras, PA-C 08/01/14 1414

## 2014-08-01 NOTE — ED Notes (Signed)
Pt reports pain to bilateral soles of feet, ankles, legs, knee caps, and "butt bone." Pt states that her "knees and ankles lock up on me." Pt reporting that pain "comes and goes, but is coming more and going less and feels like stabbing pain." Pt reports that significant other felt her "butt bone" and told her that there was "fluid on her butt bone." Sensation intact bilaterally. Pt also reporting trouble walking due to this problem. Pt has ortho appointment on Monday, but reports that the pain is "too bad to wait until then."

## 2014-08-01 NOTE — Discharge Instructions (Signed)

## 2014-08-01 NOTE — ED Notes (Signed)
Pt reports pain to bottoms of feet, both legs, lower back for several months. Denies injury.

## 2014-08-09 NOTE — ED Provider Notes (Signed)
Medical screening examination/treatment/procedure(s) were performed by non-physician practitioner and as supervising physician I was immediately available for consultation/collaboration.   EKG Interpretation None        [name], MD 08/09/14 [phone]

## 2014-08-26 ENCOUNTER — Encounter (HOSPITAL_COMMUNITY): Payer: Self-pay | Admitting: Emergency Medicine

## 2014-09-05 ENCOUNTER — Emergency Department (HOSPITAL_COMMUNITY): Payer: Medicaid Other

## 2014-09-05 ENCOUNTER — Emergency Department (HOSPITAL_COMMUNITY)
Admission: EM | Admit: 2014-09-05 | Discharge: 2014-09-05 | Disposition: A | Discharge status: Home / Self Care | Payer: Medicaid Other | Attending: Emergency Medicine | Admitting: Emergency Medicine

## 2014-09-05 ENCOUNTER — Encounter (HOSPITAL_COMMUNITY): Payer: Self-pay | Admitting: *Deleted

## 2014-09-05 DIAGNOSIS — W19XXXA Unspecified fall, initial encounter: Secondary | ICD-10-CM

## 2014-09-05 DIAGNOSIS — Z91040 Latex allergy status: Secondary | ICD-10-CM | POA: Diagnosis not present

## 2014-09-05 DIAGNOSIS — F410 Panic disorder [episodic paroxysmal anxiety] without agoraphobia: Secondary | ICD-10-CM | POA: Insufficient documentation

## 2014-09-05 DIAGNOSIS — Y998 Other external cause status: Secondary | ICD-10-CM | POA: Diagnosis not present

## 2014-09-05 DIAGNOSIS — S9305XA Dislocation of left ankle joint, initial encounter: Secondary | ICD-10-CM | POA: Diagnosis not present

## 2014-09-05 DIAGNOSIS — F4310 Post-traumatic stress disorder, unspecified: Secondary | ICD-10-CM | POA: Diagnosis not present

## 2014-09-05 DIAGNOSIS — G43909 Migraine, unspecified, not intractable, without status migrainosus: Secondary | ICD-10-CM | POA: Insufficient documentation

## 2014-09-05 DIAGNOSIS — Z8739 Personal history of other diseases of the musculoskeletal system and connective tissue: Secondary | ICD-10-CM | POA: Insufficient documentation

## 2014-09-05 DIAGNOSIS — Z79899 Other long term (current) drug therapy: Secondary | ICD-10-CM | POA: Diagnosis not present

## 2014-09-05 DIAGNOSIS — M21962 Unspecified acquired deformity of left lower leg: Secondary | ICD-10-CM

## 2014-09-05 DIAGNOSIS — Y9389 Activity, other specified: Secondary | ICD-10-CM | POA: Diagnosis not present

## 2014-09-05 DIAGNOSIS — Z720 Tobacco use: Secondary | ICD-10-CM | POA: Diagnosis not present

## 2014-09-05 DIAGNOSIS — Y9289 Other specified places as the place of occurrence of the external cause: Secondary | ICD-10-CM | POA: Diagnosis not present

## 2014-09-05 DIAGNOSIS — W109XXA Fall (on) (from) unspecified stairs and steps, initial encounter: Secondary | ICD-10-CM | POA: Insufficient documentation

## 2014-09-05 DIAGNOSIS — Z8619 Personal history of other infectious and parasitic diseases: Secondary | ICD-10-CM | POA: Diagnosis not present

## 2014-09-05 DIAGNOSIS — S99912A Unspecified injury of left ankle, initial encounter: Secondary | ICD-10-CM | POA: Diagnosis present

## 2014-09-05 MED ORDER — OXYCODONE-ACETAMINOPHEN 5-325 MG PO TABS: 2.0000 | ORAL_TABLET | ORAL | Status: DC | PRN

## 2014-09-05 MED ORDER — FENTANYL CITRATE 0.05 MG/ML IJ SOLN
50.0000 ug | Freq: Once | INTRAMUSCULAR | Status: AC
  Administered 2014-09-05: 50 ug via INTRAVENOUS

## 2014-09-05 MED ORDER — FENTANYL CITRATE 0.05 MG/ML IJ SOLN
INTRAMUSCULAR | Status: AC
  Filled 2014-09-05: qty 2

## 2014-09-05 MED ORDER — PROPOFOL 10 MG/ML IV BOLUS
200.0000 mg | Freq: Once | INTRAVENOUS | Status: AC
  Administered 2014-09-05: 80 mg via INTRAVENOUS
  Filled 2014-09-05: qty 1

## 2014-09-05 MED ORDER — FENTANYL CITRATE 0.05 MG/ML IJ SOLN
100.0000 ug | Freq: Once | INTRAMUSCULAR | Status: AC
  Administered 2014-09-05: 100 ug via INTRAVENOUS
  Filled 2014-09-05: qty 2

## 2014-09-05 NOTE — Discharge Instructions (Signed)
Your ankle was dislocated in the injury. It was relocated in the emergency room. No fractures noted on your x-rays. Follow-up with orthopedics.  You may follow-up with your previous orthopedist, or the on-call physician above. Crutches and nonweightbearing on the splint until you are seen by the orthopedic doctor.  Ankle Dislocation Ankle dislocation happens when the ankle bones move out of place. Usually, the injury that causes ankle dislocation also causes other injuries that are more serious. Often these injuries are broken ankle bones. You also may have injured nerves and blood vessels.  Your doctor will put your ankle back in place. Any tears on your skin around your ankle will be closed (this is usually done in surgery). A cast or splint will be placed around your ankle to hold it in place while it heals. Sometimes, screws and plates need to be drilled into your ankle bones to hold your ankle in place. Sometimes, pins are drilled into the bones of your lower leg and foot. These pins attach to a metal bar outside your body. The pins and the bar hold your bones in place until surgery can be done. HOME CARE  Rest your injured joint. Do not move it.  Put ice on your injured joint for 1 to 2 days or as told by your doctor.  Put ice in a plastic bag.  Place a towel between your skin and the bag.  Leave the ice on for 15 to 20 minutes, every 2 hours while your are awake.  Raise your ankle above your heart as told by your doctor. This helps limit puffiness.  Move your toes as told by your doctor so they do not get stiff.  Only take medicines as told by your doctor. GET HELP RIGHT AWAY IF:  Your cast or splint becomes loose or damaged.  Your screws, plates, or the metal bar outside your body becomes loose or damaged.  You notice fluid draining around any pins.  Your pain becomes worse, not better.  You lose feeling in your toe or cannot bend the tip of your toe. MAKE SURE YOU:    Understand these instructions.  Will watch your condition.  Will get help right away if you are not doing well or get worse. Document Released: 06/09/2011 Document Revised: 01/03/2012 Document Reviewed: 06/09/2011 [facility] Patient Information 2015 [place], [place]. This information is not intended to replace advice given to you by your health care provider. Make sure you discuss any questions you have with your health care provider.

## 2014-09-05 NOTE — ED Notes (Signed)
Ortho paged. 

## 2014-09-05 NOTE — ED Notes (Signed)
Per EMS, pt slipped on some leaves and fell down the stairs. Pt now complains of left ankle pain, which appears to be deformed upon arrival. Pt also has abrasions to both knees, bruising on left knee. Pain in left ankle was initially 9/10; pt given 66mcg fentanyl en route to hospital, now states pain is 0/10

## 2014-09-05 NOTE — ED Provider Notes (Signed)
CSN: [id number]     Arrival date & time 09/05/14  1442 History   First MD Initiated Contact with Patient 09/05/14 1508     Chief Complaint  Patient presents with  . Fall  . Ankle Pain      HPI  Patient presented evaluation of left ankle, knee, and lower leg pain after falling locked into the climbs stairs. Selection and gotten her foot intertwined in the step and fell forward at the top step. Complaints of pain in the left ankle, and left leg. Did not strike her head. Did not tumble down the stairs. No spine pain both upper abdominal pain. No upper extremity pain.  Past Medical History  Diagnosis Date  . HPV in female   . Migraines   . HPV in female   . Abnormal Pap smear   . Termination of pregnancy     x 1  . PTSD (post-traumatic stress disorder)     No meds  . Borderline personality disorder     No meds  . Panic anxiety syndrome     no meds  . Heartburn in pregnancy   . Scoliosis of lumbar spine    Past Surgical History  Procedure Laterality Date  . Cesarean section      x 2  . Colposcopy    . Wisdom tooth extraction    . Dilation and curettage of uterus    . Cesarean section  07/21/2012    Procedure: CESAREAN SECTION;  Surgeon: Frederico Hamman, MD;  Location: [facility];  Service: Obstetrics;  Laterality: N/A;  Repeat Cesarean Section Delivery Boy @ [phone], Apgars 9/9  . Cesarean section     Family History  Problem Relation Age of Onset  . Cancer Mother   . Heart disease Mother   . Anesthesia problems Neg Hx    History  Substance Use Topics  . Smoking status: Current Every Day Smoker -- 0.50 packs/day for 13 years    Types: Cigarettes  . Smokeless tobacco: Not on file  . Alcohol Use: No   OB History    Gravida Para Term Preterm AB TAB SAB Ectopic Multiple Living   5 3 2 1 1 1  0 0 0 3     Review of Systems  Constitutional: Negative for fever, chills, diaphoresis, appetite change and fatigue.  HENT: Negative for mouth sores, sore throat and trouble  swallowing.   Eyes: Negative for visual disturbance.  Respiratory: Negative for cough, chest tightness, shortness of breath and wheezing.   Cardiovascular: Negative for chest pain.  Gastrointestinal: Negative for nausea, vomiting, abdominal pain, diarrhea and abdominal distention.  Endocrine: Negative for polydipsia, polyphagia and polyuria.  Genitourinary: Negative for dysuria, frequency and hematuria.  Musculoskeletal: Negative for gait problem.       Left ankle left knee pain.  Skin: Negative for color change, pallor and rash.  Neurological: Negative for dizziness, syncope, light-headedness and headaches.  Hematological: Does not bruise/bleed easily.  Psychiatric/Behavioral: Negative for behavioral problems and confusion.      Allergies  Poison oak extract; Banana; Latex; Poison ivy extract; and Tomato  Home Medications   Prior to Admission medications   Medication Sig Start Date End Date Taking? Authorizing Provider  diphenhydramine-acetaminophen (TYLENOL PM) 25-500 MG TABS Take 1-2 tablets by mouth at bedtime as needed (for sleep).   Yes Historical Provider, MD  gabapentin (NEURONTIN) 300 MG capsule Take 300 mg by mouth 3 (three) times daily.   Yes Historical Provider, MD  hydrOXYzine (VISTARIL) 25  MG capsule Take 25 mg by mouth 2 (two) times daily as needed.   Yes Historical Provider, MD  meloxicam (MOBIC) 7.5 MG tablet Take 7.5-15 mg by mouth 2 (two) times daily as needed for pain.   Yes Historical Provider, MD  sertraline (ZOLOFT) 100 MG tablet Take 100 mg by mouth daily.   Yes Historical Provider, MD  oxyCODONE-acetaminophen (PERCOCET/ROXICET) 5-325 MG per tablet Take 2 tablets by mouth every 4 (four) hours as needed. 09/05/14   [name], MD   BP 121/66 mmHg  Pulse 61  Temp(Src) 98.7 F (37.1 C) (Oral)  Resp 12  SpO2 100%  LMP 08/31/2014 Physical Exam  Constitutional: She is oriented to person, place, and time. She appears well-developed and well-nourished. No  distress.  HENT:  Head: Normocephalic.  Eyes: Conjunctivae are normal. Pupils are equal, round, and reactive to light. No scleral icterus.  Neck: Normal range of motion. Neck supple. No thyromegaly present.  Cardiovascular: Normal rate and regular rhythm.  Exam reveals no gallop and no friction rub.   No murmur heard. Pulmonary/Chest: Effort normal and breath sounds normal. No respiratory distress. She has no wheezes. She has no rales.  Abdominal: Soft. Bowel sounds are normal. She exhibits no distension. There is no tenderness. There is no rebound.  Musculoskeletal: Normal range of motion.  Diffuse tenderness at the knee. Full range of motion of the knee. The left ankle is markedly/hyper plantarflexed and inverted. Neurovascular exam normal. Closed injury. No tenting of the skin or compromise of the skin.  Neurological: She is alert and oriented to person, place, and time.  Skin: Skin is warm and dry. No rash noted.  Psychiatric: She has a normal mood and affect. Her behavior is normal.    ED Course  Procedural sedation Date/Time: 09/05/2014 4:30 PM Performed by: [name] Authorized by: [name] Consent: Verbal consent obtained. Written consent obtained. Risks and benefits: risks, benefits and alternatives were discussed Consent given by: patient Patient understanding: patient states understanding of the procedure being performed Patient consent: the patient's understanding of the procedure matches consent given Procedure consent: procedure consent matches procedure scheduled Test results: test results available and properly labeled Site marked: the operative site was not marked Imaging studies: imaging studies available Patient identity confirmed: verbally with patient and arm band Time out: Immediately prior to procedure a "time out" was called to verify the correct patient, procedure, equipment, support staff and site/side marked as required. Local anesthesia used:  no Patient sedated: yes Sedatives: propofol Analgesia: fentanyl Sedation start date/time: 09/05/2014 4:30 PM Sedation end date/time: 09/05/2014 5:01 PM Patient tolerance: Patient tolerated the procedure well with no immediate complications  Reduction of dislocation Date/Time: 09/05/2014 4:35 PM Performed by: Korie Streat, [place] by: [name] Risks and benefits: risks, benefits and alternatives were discussed Consent given by: patient Patient understanding: patient states understanding of the procedure being performed Patient consent: the patient's understanding of the procedure matches consent given Site marked: the operative site was not marked Required items: required blood products, implants, devices, and special equipment available Patient identity confirmed: verbally with patient and arm band Time out: Immediately prior to procedure a "time out" was called to verify the correct patient, procedure, equipment, support staff and site/side marked as required. Comments: Using gentle axial traction with initially plantar flexion. The talotibial joint was easily reduced. It was held in position and splint was applied. Neurovascular exam remained normal before, and after the relocation.  Procedure x-rays show proper position.   (including  critical care time) Labs Review Labs Reviewed - No data to display  Imaging Review Dg Tibia/fibula Left  09/05/2014   CLINICAL DATA:  Recent inversion injury following slipping on leaves with leg pain, initial encounter  EXAM: LEFT TIBIA AND FIBULA - 2 VIEW  COMPARISON:  None.  FINDINGS: The tibia and fibula are within normal limits. The ankle joint remains distorted.  IMPRESSION: No tibial or fibular fracture is seen.  Distortion of the ankle joint is noted. CT may be helpful for further evaluation.   Electronically Signed   By: Inez Catalina M.D.   On: 09/05/2014 16:09   Dg Ankle Complete Left  09/05/2014   CLINICAL DATA:  Post reduction images.   Fall earlier today  EXAM: LEFT ANKLE COMPLETE - 3+ VIEW  COMPARISON:  Earlier today.  FINDINGS: Overlying cast is present which partially obscures evaluation of bony detail. Exam demonstrates normal alignment about the ankle mortise post reduction. No evidence of acute fracture no residual subluxation/dislocation.  IMPRESSION: Normal alignment about the ankle mortise post reduction. No definite fracture visualized.   Electronically Signed   By: Marin Olp M.D.   On: 09/05/2014 18:22   Dg Ankle Complete Left  09/05/2014   CLINICAL DATA:  Inversion injury following slipping on leaves, pain and deformity, initial encounter  EXAM: LEFT ANKLE COMPLETE - 3+ VIEW  COMPARISON:  None.  FINDINGS: The distal tibia and fibula show no acute fracture. There is some anterior displacement of the talus with respect to the tibia and widening of the tibiotalar articulation. This appears to represent a partial dislocation of the talus with respect to the tibia.  IMPRESSION: Changes suggestive of partial dislocation of the talus with regards to the tibia. If the foot can be better positioned repeat imaging may be helpful however CT may be more suited for further evaluation.   Electronically Signed   By: Inez Catalina M.D.   On: 09/05/2014 16:07   Dg Knee Complete 4 Views Left  09/05/2014   CLINICAL DATA:  Fall following slipping on leaves with leg pain, initial encounter  EXAM: LEFT KNEE - COMPLETE 4+ VIEW  COMPARISON:  None.  FINDINGS: There is no evidence of fracture, dislocation, or joint effusion. There is no evidence of arthropathy or other focal bone abnormality. Soft tissues are unremarkable.  IMPRESSION: No acute abnormality is noted.   Electronically Signed   By: Inez Catalina M.D.   On: 09/05/2014 16:10   Dg Foot Complete Left  09/05/2014   CLINICAL DATA:  Patient fell on some leaves and left foot turned inward and under; diffuse left ankle pain and left ankle deformity; diffuse left knee and tib/fib pain  EXAM:  LEFT FOOT - COMPLETE 3+ VIEW  COMPARISON:  Left ankle radiographs obtained at the same time.  FINDINGS: There is significant deformity of the foot and ankle due to marked plantar flexion and inversion of the foot. There is some widening of the ankle mortise laterally. No definite dislocation is seen and no fractures are visible.  IMPRESSION: Significant deformity of the foot due to marked plantar flexion and inversion of the foot, making it difficult to assess the bony relationships. Repeat foot and ankle radiographs are recommended if the patient is able to get her foot in a normal position. If not, a CT of the foot and ankle without contrast would be recommended.   Electronically Signed   By: Enrique Sack M.D.   On: 09/05/2014 16:09     EKG Interpretation  None      MDM   Final diagnoses:  Fall  Ankle dislocation, left, initial encounter    Ankle is successful he relocated. Follow-up x-ray showed proper position of the talotibial joint. No fractures noted. She was placed in a coaptation splint. I assisted with this. His probably position. She remains neurovascularly intact and properly position. She is instructed in the use of crutches.  She has seen [facility] in the past. She could not remember the name of her physician. I given her Dr. Doran Durand the name of our on-call orthopedist to call for a follow-up appointment.    [name], MD 09/05/14 [phone]

## 2014-09-05 NOTE — ED Notes (Signed)
Bed: WA14 Expected date:  Expected time:  Means of arrival:  Comments: fall 

## 2014-09-05 NOTE — Progress Notes (Signed)
Orthopedic Tech Progress Note Patient Details:  Suzanne Klein [birth year] [id number] Assisted MD with reduction of ankle dislocation.  Applied fiberglass posterior short leg splint and fiberglass stirrup splint to LLE post-reduction.  Pulses, motion, sensation intact before and after splinting.  Capillary refill less than 2 seconds before and after splinting. Ortho Devices Type of Ortho Device: Post (short leg) splint, Stirrup splint Ortho Device/Splint Location: LLE Ortho Device/Splint Interventions: Application   Darrol Poke 09/05/2014, 5:45 PM

## 2014-10-12 ENCOUNTER — Emergency Department (HOSPITAL_COMMUNITY)
Admission: EM | Admit: 2014-10-12 | Discharge: 2014-10-13 | Disposition: A | Discharge status: Home / Self Care | Payer: Medicaid Other | Attending: Emergency Medicine | Admitting: Emergency Medicine

## 2014-10-12 ENCOUNTER — Encounter (HOSPITAL_COMMUNITY): Payer: Self-pay | Admitting: Emergency Medicine

## 2014-10-12 DIAGNOSIS — Z91040 Latex allergy status: Secondary | ICD-10-CM | POA: Diagnosis not present

## 2014-10-12 DIAGNOSIS — F410 Panic disorder [episodic paroxysmal anxiety] without agoraphobia: Secondary | ICD-10-CM | POA: Diagnosis not present

## 2014-10-12 DIAGNOSIS — Z79899 Other long term (current) drug therapy: Secondary | ICD-10-CM | POA: Diagnosis not present

## 2014-10-12 DIAGNOSIS — G43909 Migraine, unspecified, not intractable, without status migrainosus: Secondary | ICD-10-CM | POA: Insufficient documentation

## 2014-10-12 DIAGNOSIS — Z8739 Personal history of other diseases of the musculoskeletal system and connective tissue: Secondary | ICD-10-CM | POA: Insufficient documentation

## 2014-10-12 DIAGNOSIS — F4310 Post-traumatic stress disorder, unspecified: Secondary | ICD-10-CM | POA: Diagnosis not present

## 2014-10-12 DIAGNOSIS — B349 Viral infection, unspecified: Secondary | ICD-10-CM | POA: Diagnosis not present

## 2014-10-12 DIAGNOSIS — Z8619 Personal history of other infectious and parasitic diseases: Secondary | ICD-10-CM | POA: Diagnosis not present

## 2014-10-12 DIAGNOSIS — R52 Pain, unspecified: Secondary | ICD-10-CM | POA: Diagnosis present

## 2014-10-12 DIAGNOSIS — Z3202 Encounter for pregnancy test, result negative: Secondary | ICD-10-CM | POA: Insufficient documentation

## 2014-10-12 DIAGNOSIS — Z720 Tobacco use: Secondary | ICD-10-CM | POA: Diagnosis not present

## 2014-10-12 MED ORDER — METOCLOPRAMIDE HCL 5 MG/ML IJ SOLN
10.0000 mg | Freq: Once | INTRAMUSCULAR | Status: AC
  Administered 2014-10-13: 10 mg via INTRAVENOUS
  Filled 2014-10-12: qty 2

## 2014-10-12 MED ORDER — DIPHENHYDRAMINE HCL 50 MG/ML IJ SOLN
50.0000 mg | Freq: Once | INTRAMUSCULAR | Status: AC
  Administered 2014-10-13: 50 mg via INTRAVENOUS
  Filled 2014-10-12: qty 1

## 2014-10-12 MED ORDER — KETOROLAC TROMETHAMINE 30 MG/ML IJ SOLN
30.0000 mg | Freq: Once | INTRAMUSCULAR | Status: AC
  Administered 2014-10-13: 30 mg via INTRAVENOUS
  Filled 2014-10-12: qty 1

## 2014-10-12 MED ORDER — SODIUM CHLORIDE 0.9 % IV BOLUS (SEPSIS)
1000.0000 mL | Freq: Once | INTRAVENOUS | Status: AC
  Administered 2014-10-13: 1000 mL via INTRAVENOUS

## 2014-10-12 NOTE — ED Notes (Signed)
The pt arrived by [facility] c/o total body aches chills headache elevated temp with nausea diarrhea.  lmp last month.

## 2014-10-12 NOTE — ED Notes (Signed)
Pt arrives with c/o fever/chills/body aches since Wednesday, called EMS and stated she "couldn't breathe". Ambulatory with a steady gait.

## 2014-10-12 NOTE — ED Provider Notes (Signed)
CSN: [id number]     Arrival date & time 10/12/14  2323 History  This chart was scribed for Suzanne Balls, MD by Chester Holstein, ED Scribe. This patient was seen in room D34C/D34C and the patient's care was started at 11:45 PM.    Chief Complaint  Patient presents with  . Generalized Body Aches     The history is provided by the patient. No language interpreter was used.    HPI Comments: Suzanne Klein is a 33 y.o. female who presents to the Emergency Department complaining of body aches with onset, 3 days ago Pt notes associated headache, diarrhea with onset tonight, reduced appetite, difficulty sleeping, nausea, dry mouth, and subjective fever.She notes no changes in pain with eating. Pt has taken Tylenol for relief but was unable to take any today.  Pt has PMHx of plantar fasciitis and neuropathy in both feet and migraines. She states she's had migraines in the past that are worse than this one. Pt notes she has not taken her Neurontin in 3 or 4 weeks, but has been taking with her Zoloft and Vistaril except today.  Pt denies cough, sneezing, sore throat, and vomiting. She has no sick contacts. Patient has no further complaints.    Past Medical History  Diagnosis Date  . HPV in female   . Migraines   . HPV in female   . Abnormal Pap smear   . Termination of pregnancy     x 1  . PTSD (post-traumatic stress disorder)     No meds  . Borderline personality disorder     No meds  . Panic anxiety syndrome     no meds  . Heartburn in pregnancy   . Scoliosis of lumbar spine    Past Surgical History  Procedure Laterality Date  . Cesarean section      x 2  . Colposcopy    . Wisdom tooth extraction    . Dilation and curettage of uterus    . Cesarean section  07/21/2012    Procedure: CESAREAN SECTION;  Surgeon: Frederico Hamman, MD;  Location: [facility];  Service: Obstetrics;  Laterality: N/A;  Repeat Cesarean Section Delivery Boy @ [phone], Apgars 9/9  . Cesarean section     Family  History  Problem Relation Age of Onset  . Cancer Mother   . Heart disease Mother   . Anesthesia problems Neg Hx    History  Substance Use Topics  . Smoking status: Current Every Day Smoker -- 0.50 packs/day for 13 years    Types: Cigarettes  . Smokeless tobacco: Not on file  . Alcohol Use: No   OB History    Gravida Para Term Preterm AB TAB SAB Ectopic Multiple Living   5 3 2 1 1 1  0 0 0 3     Review of Systems  Constitutional: Positive for fever and activity change.  Gastrointestinal: Positive for nausea and diarrhea. Negative for vomiting.  Musculoskeletal: Positive for myalgias.  Neurological: Positive for headaches.      Allergies  Poison oak extract; Banana; Latex; Poison ivy extract; and Tomato  Home Medications   Prior to Admission medications   Medication Sig Start Date End Date Taking? Authorizing Provider  diphenhydramine-acetaminophen (TYLENOL PM) 25-500 MG TABS Take 1-2 tablets by mouth at bedtime as needed (for sleep).    Historical Provider, MD  gabapentin (NEURONTIN) 300 MG capsule Take 300 mg by mouth 3 (three) times daily.    Historical Provider, MD  hydrOXYzine (VISTARIL)  25 MG capsule Take 25 mg by mouth 2 (two) times daily as needed.    Historical Provider, MD  meloxicam (MOBIC) 7.5 MG tablet Take 7.5-15 mg by mouth 2 (two) times daily as needed for pain.    Historical Provider, MD  oxyCODONE-acetaminophen (PERCOCET/ROXICET) 5-325 MG per tablet Take 2 tablets by mouth every 4 (four) hours as needed. 09/05/14   [name], MD  sertraline (ZOLOFT) 100 MG tablet Take 100 mg by mouth daily.    Historical Provider, MD   BP 104/61 mmHg  Pulse 80  Temp(Src) 98.4 F (36.9 C) (Oral)  Resp 16  SpO2 98%  LMP 09/11/2014 (Approximate)  Breastfeeding? No Physical Exam  Constitutional: She is oriented to person, place, and time. She appears well-developed and well-nourished. No distress.  HENT:  Head: Normocephalic and atraumatic.  Eyes: Conjunctivae and EOM  are normal. Pupils are equal, round, and reactive to light. No scleral icterus.  Neck: Normal range of motion. Neck supple. No JVD present. No tracheal deviation present. No thyromegaly present.  Cardiovascular: Normal rate, regular rhythm and normal heart sounds.  Exam reveals no gallop and no friction rub.   No murmur heard. Pulmonary/Chest: Effort normal and breath sounds normal. No respiratory distress. She has no wheezes. She has no rales. She exhibits no tenderness.  Abdominal: Soft. Bowel sounds are normal. She exhibits no distension and no mass. There is no tenderness. There is no rebound and no guarding.  Musculoskeletal: Normal range of motion. She exhibits no edema or tenderness.  Lymphadenopathy:    She has no cervical adenopathy.  Neurological: She is alert and oriented to person, place, and time.  Skin: Skin is warm and dry. No rash noted. No erythema. No pallor.  Psychiatric: She has a normal mood and affect. Her behavior is normal.  Nursing note and vitals reviewed.   ED Course  Procedures (including critical care time) DIAGNOSTIC STUDIES: Oxygen Saturation is 98% on room air, normal by my interpretation.    COORDINATION OF CARE: 11:53 PM Discussed treatment plan with patient at beside, the patient agrees with the plan and has no further questions at this time.   Labs Review Labs Reviewed - No data to display  Imaging Review No results found.   EKG Interpretation None      MDM   Final diagnoses:  None    Patient since emergency department for multiple complaints. Her history sounds like a viral syndrome, she has total body aches, chills, headache, subjective fevers. Her vital signs are within normal limits. She was given Toradol, Reglan, Benadryl for treatment. Upon my repeat evaluation patient was found sleeping comfortably in the room. She states that her symptoms have improved.  She is requesting Reglan to go home with for further headache control. Primary  care follow-up was advised, her vital signs remain within her normal limits and she is safe for discharge.  I personally performed the services described in this documentation, which was scribed in my presence. The recorded information has been reviewed and is accurate.     Suzanne Balls, MD 10/13/14 [phone]

## 2014-10-13 LAB — CBC WITH DIFFERENTIAL/PLATELET
BASOS PCT: 0 % (ref 0–1)
Basophils Absolute: 0 10*3/uL (ref 0.0–0.1)
EOS ABS: 0.2 10*3/uL (ref 0.0–0.7)
Eosinophils Relative: 2 % (ref 0–5)
HEMATOCRIT: 38.8 % (ref 36.0–46.0)
HEMOGLOBIN: 12.7 g/dL (ref 12.0–15.0)
Lymphocytes Relative: 25 % (ref 12–46)
Lymphs Abs: 2.5 10*3/uL (ref 0.7–4.0)
MCH: 30.1 pg (ref 26.0–34.0)
MCHC: 32.7 g/dL (ref 30.0–36.0)
MCV: 91.9 fL (ref 78.0–100.0)
MONO ABS: 0.6 10*3/uL (ref 0.1–1.0)
Monocytes Relative: 6 % (ref 3–12)
NEUTROS ABS: 6.9 10*3/uL (ref 1.7–7.7)
Neutrophils Relative %: 67 % (ref 43–77)
Platelets: 240 10*3/uL (ref 150–400)
RBC: 4.22 MIL/uL (ref 3.87–5.11)
RDW: 13.2 % (ref 11.5–15.5)
WBC: 10.2 10*3/uL (ref 4.0–10.5)

## 2014-10-13 LAB — URINALYSIS, ROUTINE W REFLEX MICROSCOPIC
Glucose, UA: NEGATIVE mg/dL
Hgb urine dipstick: NEGATIVE
KETONES UR: NEGATIVE mg/dL
Leukocytes, UA: NEGATIVE
NITRITE: NEGATIVE
PROTEIN: NEGATIVE mg/dL
SPECIFIC GRAVITY, URINE: 1.031 — AB (ref 1.005–1.030)
Urobilinogen, UA: 1 mg/dL (ref 0.0–1.0)
pH: 5.5 (ref 5.0–8.0)

## 2014-10-13 LAB — COMPREHENSIVE METABOLIC PANEL
ALBUMIN: 3.5 g/dL (ref 3.5–5.2)
ALT: 6 U/L (ref 0–35)
AST: 12 U/L (ref 0–37)
Alkaline Phosphatase: 69 U/L (ref 39–117)
Anion gap: 12 (ref 5–15)
BUN: 15 mg/dL (ref 6–23)
CHLORIDE: 104 meq/L (ref 96–112)
CO2: 23 mEq/L (ref 19–32)
Calcium: 8.7 mg/dL (ref 8.4–10.5)
Creatinine, Ser: 0.61 mg/dL (ref 0.50–1.10)
GFR calc Af Amer: >90 mL/min (ref >90)
GFR calc non Af Amer: >90 mL/min (ref >90)
Glucose, Bld: 131 mg/dL — ABNORMAL HIGH (ref 70–99)
Potassium: 3.9 mEq/L (ref 3.7–5.3)
Sodium: 139 mEq/L (ref 137–147)
Total Bilirubin: <0.2 mg/dL — ABNORMAL LOW (ref 0.3–1.2)
Total Protein: 6.7 g/dL (ref 6.0–8.3)

## 2014-10-13 LAB — PREGNANCY, URINE: PREG TEST UR: NEGATIVE

## 2014-10-13 LAB — CK: CK TOTAL: 44 U/L (ref 7–177)

## 2014-10-13 LAB — LIPASE, BLOOD: Lipase: 24 U/L (ref 11–59)

## 2014-10-13 MED ORDER — METOCLOPRAMIDE HCL 10 MG PO TABS: 10.0000 mg | ORAL_TABLET | Freq: Three times a day (TID) | ORAL | Status: DC | PRN

## 2014-10-13 NOTE — Discharge Instructions (Signed)
Viral Infections [name], you were seen today for diffuse body aches. This is consistent with a viral infection. Your blood work was normal. Follow-up with a primary care physician within 3 days for continued management. If symptoms worsen come back to emergency department immediately for repeat evaluation. Thank you. A virus is a type of germ. Viruses can cause:  Minor sore throats.  Aches and pains.  Headaches.  Runny nose.  Rashes.  Watery eyes.  Tiredness.  Coughs.  Loss of appetite.  Feeling sick to your stomach (nausea).  Throwing up (vomiting).  Watery poop (diarrhea). HOME CARE   Only take medicines as told by your doctor.  Drink enough water and fluids to keep your pee (urine) clear or pale yellow. Sports drinks are a good choice.  Get plenty of rest and eat healthy. Soups and broths with crackers or rice are fine. GET HELP RIGHT AWAY IF:   You have a very bad headache.  You have shortness of breath.  You have chest pain or neck pain.  You have an unusual rash.  You cannot stop throwing up.  You have watery poop that does not stop.  You cannot keep fluids down.  You or your child has a temperature by mouth above 102 F (38.9 C), not controlled by medicine.  Your baby is older than 3 months with a rectal temperature of 102 F (38.9 C) or higher.  Your baby is 59 months old or younger with a rectal temperature of 100.4 F (38 C) or higher. MAKE SURE YOU:   Understand these instructions.  Will watch this condition.  Will get help right away if you are not doing well or get worse. Document Released: 09/23/2008 Document Revised: 01/03/2012 Document Reviewed: 02/16/2011 [facility] Patient Information 2015 [place], [place]. This information is not intended to replace advice given to you by your health care provider. Make sure you discuss any questions you have with your health care provider.

## 2014-10-14 LAB — URINE CULTURE: Colony Count: >=100000

## 2014-10-21 ENCOUNTER — Encounter: Payer: Self-pay | Admitting: *Deleted

## 2014-10-22 ENCOUNTER — Emergency Department (HOSPITAL_COMMUNITY)
Admission: EM | Admit: 2014-10-22 | Discharge: 2014-10-22 | Discharge status: Against Medical Advice | Payer: Medicaid Other | Attending: Emergency Medicine | Admitting: Emergency Medicine

## 2014-10-22 ENCOUNTER — Encounter (HOSPITAL_COMMUNITY): Payer: Self-pay | Admitting: Emergency Medicine

## 2014-10-22 DIAGNOSIS — R197 Diarrhea, unspecified: Secondary | ICD-10-CM | POA: Insufficient documentation

## 2014-10-22 DIAGNOSIS — K088 Other specified disorders of teeth and supporting structures: Secondary | ICD-10-CM | POA: Diagnosis not present

## 2014-10-22 DIAGNOSIS — Z720 Tobacco use: Secondary | ICD-10-CM | POA: Diagnosis not present

## 2014-10-22 DIAGNOSIS — R110 Nausea: Secondary | ICD-10-CM | POA: Insufficient documentation

## 2014-10-22 NOTE — ED Notes (Signed)
Pt c/o HA, body aches, dental pain, diarrhea and nausea x 3 days

## 2014-10-22 NOTE — ED Notes (Signed)
Pt sts she has to go home. LWBS after triage.

## 2014-10-30 ENCOUNTER — Emergency Department (HOSPITAL_COMMUNITY)
Admission: EM | Admit: 2014-10-30 | Discharge: 2014-10-30 | Disposition: A | Discharge status: Home / Self Care | Payer: Medicaid Other | Attending: Emergency Medicine | Admitting: Emergency Medicine

## 2014-10-30 ENCOUNTER — Encounter (HOSPITAL_COMMUNITY): Payer: Self-pay | Admitting: *Deleted

## 2014-10-30 ENCOUNTER — Emergency Department (HOSPITAL_COMMUNITY): Payer: Medicaid Other

## 2014-10-30 DIAGNOSIS — Z8619 Personal history of other infectious and parasitic diseases: Secondary | ICD-10-CM | POA: Diagnosis not present

## 2014-10-30 DIAGNOSIS — G43909 Migraine, unspecified, not intractable, without status migrainosus: Secondary | ICD-10-CM | POA: Insufficient documentation

## 2014-10-30 DIAGNOSIS — Y9389 Activity, other specified: Secondary | ICD-10-CM | POA: Diagnosis not present

## 2014-10-30 DIAGNOSIS — Y9289 Other specified places as the place of occurrence of the external cause: Secondary | ICD-10-CM | POA: Diagnosis not present

## 2014-10-30 DIAGNOSIS — W010XXA Fall on same level from slipping, tripping and stumbling without subsequent striking against object, initial encounter: Secondary | ICD-10-CM | POA: Diagnosis not present

## 2014-10-30 DIAGNOSIS — Z91040 Latex allergy status: Secondary | ICD-10-CM | POA: Insufficient documentation

## 2014-10-30 DIAGNOSIS — Z79899 Other long term (current) drug therapy: Secondary | ICD-10-CM | POA: Diagnosis not present

## 2014-10-30 DIAGNOSIS — S4992XA Unspecified injury of left shoulder and upper arm, initial encounter: Secondary | ICD-10-CM | POA: Diagnosis not present

## 2014-10-30 DIAGNOSIS — M41126 Adolescent idiopathic scoliosis, lumbar region: Secondary | ICD-10-CM | POA: Insufficient documentation

## 2014-10-30 DIAGNOSIS — Y998 Other external cause status: Secondary | ICD-10-CM | POA: Insufficient documentation

## 2014-10-30 DIAGNOSIS — F410 Panic disorder [episodic paroxysmal anxiety] without agoraphobia: Secondary | ICD-10-CM | POA: Diagnosis not present

## 2014-10-30 DIAGNOSIS — Z720 Tobacco use: Secondary | ICD-10-CM | POA: Insufficient documentation

## 2014-10-30 DIAGNOSIS — M79602 Pain in left arm: Secondary | ICD-10-CM

## 2014-10-30 MED ORDER — TRAMADOL HCL 50 MG PO TABS: 50.0000 mg | ORAL_TABLET | Freq: Four times a day (QID) | ORAL | Status: DC | PRN

## 2014-10-30 MED ORDER — GABAPENTIN 300 MG PO CAPS
300.0000 mg | ORAL_CAPSULE | Freq: Once | ORAL | Status: AC
  Administered 2014-10-30: 300 mg via ORAL
  Filled 2014-10-30: qty 1

## 2014-10-30 MED ORDER — MELOXICAM 7.5 MG PO TABS: 7.5000 mg | ORAL_TABLET | Freq: Every day | ORAL | Status: DC

## 2014-10-30 MED ORDER — GABAPENTIN 300 MG PO CAPS: 300.0000 mg | ORAL_CAPSULE | Freq: Three times a day (TID) | ORAL | Status: DC

## 2014-10-30 MED ORDER — IBUPROFEN 400 MG PO TABS
600.0000 mg | ORAL_TABLET | Freq: Once | ORAL | Status: AC
  Administered 2014-10-30: 600 mg via ORAL
  Filled 2014-10-30 (×2): qty 1

## 2014-10-30 NOTE — ED Notes (Signed)
Pt requested and given bus pass.

## 2014-10-30 NOTE — ED Notes (Signed)
Waiting for xray. Charge nurse notified.

## 2014-10-30 NOTE — ED Notes (Signed)
Pt in c/o injury to left arm, states it has already been hurting her for the last few days, then she tripped this morning and landed on her left arm also

## 2014-10-30 NOTE — ED Provider Notes (Signed)
CSN: [id number]     Arrival date & time 10/30/14  1215 History   First MD Initiated Contact with Patient 10/30/14 1250     Chief Complaint  Patient presents with  . Arm Pain     (Consider location/radiation/quality/duration/timing/severity/associated sxs/prior Treatment) HPI  Pt is a 34yo female presenting to ED with c/o left arm pain x2-3 days, worse after pt tripped and fell on her left side onto her left arm today around 10AM.  Denies hitting head or LOC.  Pt states pain is constant, aching and throbbing, worse with movement, 6/10.  Denies taking any pain medication. States she was prescribed gabapentin a few months ago but was unable to afford it and is wondering if not being on the medication has caused her arm to start hurting again.  Denies any new pain. Denies neck surgeries.  No other injuries.   Past Medical History  Diagnosis Date  . HPV in female   . Migraines   . HPV in female   . Abnormal Pap smear   . Termination of pregnancy     x 1  . PTSD (post-traumatic stress disorder)     No meds  . Borderline personality disorder     No meds  . Panic anxiety syndrome     no meds  . Heartburn in pregnancy   . Scoliosis of lumbar spine    Past Surgical History  Procedure Laterality Date  . Cesarean section      x 2  . Colposcopy    . Wisdom tooth extraction    . Dilation and curettage of uterus    . Cesarean section  07/21/2012    Procedure: CESAREAN SECTION;  Surgeon: Frederico Hamman, MD;  Location: [facility];  Service: Obstetrics;  Laterality: N/A;  Repeat Cesarean Section Delivery Boy @ [phone], Apgars 9/9  . Cesarean section     Family History  Problem Relation Age of Onset  . Cancer Mother   . Heart disease Mother   . Anesthesia problems Neg Hx    History  Substance Use Topics  . Smoking status: Current Every Day Smoker -- 0.50 packs/day for 13 years    Types: Cigarettes  . Smokeless tobacco: Not on file  . Alcohol Use: No   OB History    Gravida Para Term  Preterm AB TAB SAB Ectopic Multiple Living   5 3 2 1 1 1  0 0 0 3     Review of Systems  Constitutional: Negative for fever and chills.  Musculoskeletal: Positive for myalgias and arthralgias. Negative for back pain, joint swelling, neck pain and neck stiffness.       Left arm  Skin: Negative for color change, rash and wound.  Neurological: Negative for weakness and numbness.  All other systems reviewed and are negative.     Allergies  Poison oak extract; Banana; Latex; Poison ivy extract; and Tomato  Home Medications   Prior to Admission medications   Medication Sig Start Date End Date Taking? Authorizing Provider  diphenhydramine-acetaminophen (TYLENOL PM) 25-500 MG TABS Take 1-2 tablets by mouth at bedtime as needed (for sleep).    Historical Provider, MD  gabapentin (NEURONTIN) 300 MG capsule Take 1 capsule (300 mg total) by mouth 3 (three) times daily. 10/30/14   Noland Fordyce, PA-C  hydrOXYzine (VISTARIL) 25 MG capsule Take 25 mg by mouth 2 (two) times daily as needed for anxiety.     Historical Provider, MD  meloxicam (MOBIC) 7.5 MG tablet Take 1-2  tablets (7.5-15 mg total) by mouth daily. 10/30/14   Noland Fordyce, PA-C  metoCLOPramide (REGLAN) 10 MG tablet Take 1 tablet (10 mg total) by mouth every 8 (eight) hours as needed (headache). 10/13/14   [name], MD  oxyCODONE-acetaminophen (PERCOCET/ROXICET) 5-325 MG per tablet Take 2 tablets by mouth every 4 (four) hours as needed. Patient not taking: Reported on 10/13/2014 09/05/14   [name], MD  sertraline (ZOLOFT) 100 MG tablet Take 100 mg by mouth daily.    Historical Provider, MD  traMADol (ULTRAM) 50 MG tablet Take 1 tablet (50 mg total) by mouth every 6 (six) hours as needed. 10/30/14   Noland Fordyce, PA-C   BP 91/58 mmHg  Pulse 80  Temp(Src) 98.5 F (36.9 C) (Oral)  Resp 18  SpO2 100%  LMP 10/20/2014 Physical Exam  Constitutional: She appears well-developed and well-nourished. No distress.  HENT:  Head:  Normocephalic and atraumatic.  Eyes: Conjunctivae are normal. No scleral icterus.  Neck: Normal range of motion. Neck supple.  No midline bone tenderness, no crepitus or step-offs.   Cardiovascular: Normal rate.   Pulmonary/Chest: Effort normal.  Musculoskeletal: Normal range of motion. She exhibits tenderness. She exhibits no edema.  Left arm: FROM left shoulder, elbow and wrist. Diffuse tenderness to shoulder, posterior upper arm and elbow. 5/5 strength in bilateral upper extremities.   Neurological: She is alert.  Skin: Skin is warm and dry. No rash noted. She is not diaphoretic. No erythema. No pallor.  Left arm: Skin in tact. No ecchymosis, erythema, or warmth. No red streaking, induration, or evidence of underlying infection  Psychiatric: She has a normal mood and affect. Her behavior is normal.  Nursing note and vitals reviewed.   ED Course  Procedures (including critical care time) Labs Review Labs Reviewed - No data to display  Imaging Review Dg Elbow Complete Left  10/30/2014   CLINICAL DATA:  Initial encounter for fall yesterday with elbow pain.  EXAM: LEFT ELBOW - COMPLETE 3+ VIEW  COMPARISON:  None.  FINDINGS: There is no evidence of fracture, dislocation, or joint effusion. There is no evidence of arthropathy or other focal bone abnormality. Soft tissues are unremarkable.  IMPRESSION: Negative.   Electronically Signed   By: Misty Stanley M.D.   On: 10/30/2014 14:40   Dg Shoulder Left  10/30/2014   CLINICAL DATA:  Initial encounter for pain in shoulder after fall yesterday.  EXAM: LEFT SHOULDER - 2+ VIEW  COMPARISON:  None.  FINDINGS: There is no evidence of fracture or dislocation. There is no evidence of arthropathy or other focal bone abnormality. Soft tissues are unremarkable.  IMPRESSION: Negative.   Electronically Signed   By: Misty Stanley M.D.   On: 10/30/2014 14:40     EKG Interpretation None      MDM   Final diagnoses:  Left arm pain    Pt is a 34yo female  presenting to ED with c/o left arm pain. Hx of neuropathy. Not taken gabapentin for several months due to cost. Reports falling on left arm earlier today w/o hitting head or other injuries. Left arm is neurovascularly in tact.  Plain films: unremarkable.  Rx: gabapentin, mobic, and tramadol. Advised to f/u with PCP as needed. Return precautions provided. Pt verbalized understanding and agreement with tx plan.     Noland Fordyce, PA-C 10/30/14 [place], MD 10/30/14 [phone]

## 2014-10-31 ENCOUNTER — Emergency Department (HOSPITAL_COMMUNITY)
Admission: EM | Admit: 2014-10-31 | Discharge: 2014-10-31 | Disposition: A | Discharge status: Home / Self Care | Payer: Medicaid Other | Attending: Emergency Medicine | Admitting: Emergency Medicine

## 2014-10-31 ENCOUNTER — Encounter (HOSPITAL_COMMUNITY): Payer: Self-pay | Admitting: *Deleted

## 2014-10-31 ENCOUNTER — Ambulatory Visit (HOSPITAL_COMMUNITY)
Admission: RE | Admit: 2014-10-31 | Discharge: 2014-10-31 | Disposition: A | Discharge status: Home / Self Care | Payer: Medicaid Other | Source: Home / Self Care | Attending: Psychiatry | Admitting: Psychiatry

## 2014-10-31 ENCOUNTER — Inpatient Hospital Stay (HOSPITAL_COMMUNITY)
Admission: AD | Admit: 2014-10-31 | Discharge: 2014-11-04 | DRG: 885 | Disposition: A | Discharge status: Home / Self Care | Payer: Medicaid Other | Source: Intra-hospital | Attending: Psychiatry | Admitting: Psychiatry

## 2014-10-31 DIAGNOSIS — Z8249 Family history of ischemic heart disease and other diseases of the circulatory system: Secondary | ICD-10-CM | POA: Diagnosis not present

## 2014-10-31 DIAGNOSIS — F332 Major depressive disorder, recurrent severe without psychotic features: Secondary | ICD-10-CM | POA: Diagnosis present

## 2014-10-31 DIAGNOSIS — M4126 Other idiopathic scoliosis, lumbar region: Secondary | ICD-10-CM | POA: Diagnosis not present

## 2014-10-31 DIAGNOSIS — F4010 Social phobia, unspecified: Secondary | ICD-10-CM | POA: Diagnosis present

## 2014-10-31 DIAGNOSIS — Z79899 Other long term (current) drug therapy: Secondary | ICD-10-CM | POA: Insufficient documentation

## 2014-10-31 DIAGNOSIS — R45851 Suicidal ideations: Secondary | ICD-10-CM | POA: Diagnosis present

## 2014-10-31 DIAGNOSIS — Z91040 Latex allergy status: Secondary | ICD-10-CM | POA: Insufficient documentation

## 2014-10-31 DIAGNOSIS — G4710 Hypersomnia, unspecified: Secondary | ICD-10-CM | POA: Diagnosis present

## 2014-10-31 DIAGNOSIS — G43909 Migraine, unspecified, not intractable, without status migrainosus: Secondary | ICD-10-CM | POA: Diagnosis not present

## 2014-10-31 DIAGNOSIS — Z008 Encounter for other general examination: Secondary | ICD-10-CM | POA: Diagnosis present

## 2014-10-31 DIAGNOSIS — Z809 Family history of malignant neoplasm, unspecified: Secondary | ICD-10-CM

## 2014-10-31 DIAGNOSIS — F410 Panic disorder [episodic paroxysmal anxiety] without agoraphobia: Secondary | ICD-10-CM | POA: Diagnosis present

## 2014-10-31 DIAGNOSIS — Z62810 Personal history of physical and sexual abuse in childhood: Secondary | ICD-10-CM | POA: Diagnosis present

## 2014-10-31 DIAGNOSIS — F1210 Cannabis abuse, uncomplicated: Secondary | ICD-10-CM | POA: Insufficient documentation

## 2014-10-31 DIAGNOSIS — Z791 Long term (current) use of non-steroidal anti-inflammatories (NSAID): Secondary | ICD-10-CM | POA: Diagnosis not present

## 2014-10-31 DIAGNOSIS — M419 Scoliosis, unspecified: Secondary | ICD-10-CM | POA: Diagnosis present

## 2014-10-31 DIAGNOSIS — F329 Major depressive disorder, single episode, unspecified: Secondary | ICD-10-CM | POA: Diagnosis not present

## 2014-10-31 DIAGNOSIS — Z8619 Personal history of other infectious and parasitic diseases: Secondary | ICD-10-CM | POA: Diagnosis not present

## 2014-10-31 DIAGNOSIS — Z720 Tobacco use: Secondary | ICD-10-CM | POA: Insufficient documentation

## 2014-10-31 DIAGNOSIS — F411 Generalized anxiety disorder: Secondary | ICD-10-CM | POA: Diagnosis present

## 2014-10-31 DIAGNOSIS — Z23 Encounter for immunization: Secondary | ICD-10-CM | POA: Diagnosis not present

## 2014-10-31 DIAGNOSIS — F4310 Post-traumatic stress disorder, unspecified: Secondary | ICD-10-CM | POA: Diagnosis present

## 2014-10-31 DIAGNOSIS — F603 Borderline personality disorder: Secondary | ICD-10-CM | POA: Diagnosis present

## 2014-10-31 DIAGNOSIS — F32A Depression, unspecified: Secondary | ICD-10-CM

## 2014-10-31 DIAGNOSIS — F17210 Nicotine dependence, cigarettes, uncomplicated: Secondary | ICD-10-CM | POA: Diagnosis present

## 2014-10-31 LAB — RAPID URINE DRUG SCREEN, HOSP PERFORMED
Amphetamines: NOT DETECTED
BARBITURATES: NOT DETECTED
Benzodiazepines: NOT DETECTED
COCAINE: NOT DETECTED
OPIATES: NOT DETECTED
Tetrahydrocannabinol: POSITIVE — AB

## 2014-10-31 LAB — COMPREHENSIVE METABOLIC PANEL
ALBUMIN: 4.1 g/dL (ref 3.5–5.2)
ALT: 9 U/L (ref 0–35)
ANION GAP: 6 (ref 5–15)
AST: 13 U/L (ref 0–37)
Alkaline Phosphatase: 61 U/L (ref 39–117)
BUN: 8 mg/dL (ref 6–23)
CHLORIDE: 109 meq/L (ref 96–112)
CO2: 24 mmol/L (ref 19–32)
CREATININE: 0.62 mg/dL (ref 0.50–1.10)
Calcium: 9.1 mg/dL (ref 8.4–10.5)
GFR calc Af Amer: >90 mL/min (ref >90)
Glucose, Bld: 83 mg/dL (ref 70–99)
Potassium: 3.8 mmol/L (ref 3.5–5.1)
Sodium: 139 mmol/L (ref 135–145)
Total Bilirubin: 0.6 mg/dL (ref 0.3–1.2)
Total Protein: 6.9 g/dL (ref 6.0–8.3)

## 2014-10-31 LAB — CBC
HCT: 38 % (ref 36.0–46.0)
Hemoglobin: 12.2 g/dL (ref 12.0–15.0)
MCH: 30.3 pg (ref 26.0–34.0)
MCHC: 32.1 g/dL (ref 30.0–36.0)
MCV: 94.3 fL (ref 78.0–100.0)
PLATELETS: 253 10*3/uL (ref 150–400)
RBC: 4.03 MIL/uL (ref 3.87–5.11)
RDW: 13.5 % (ref 11.5–15.5)
WBC: 7.4 10*3/uL (ref 4.0–10.5)

## 2014-10-31 LAB — ETHANOL

## 2014-10-31 LAB — ACETAMINOPHEN LEVEL: Acetaminophen (Tylenol), Serum: <10 ug/mL — ABNORMAL LOW (ref 10–30)

## 2014-10-31 LAB — SALICYLATE LEVEL: Salicylate Lvl: <4 mg/dL (ref 2.8–20.0)

## 2014-10-31 MED ORDER — TRAZODONE HCL 50 MG PO TABS
50.0000 mg | ORAL_TABLET | Freq: Every evening | ORAL | Status: DC | PRN
  Administered 2014-10-31 – 2014-11-03 (×4): 50 mg via ORAL
  Filled 2014-10-31 (×5): qty 1

## 2014-10-31 MED ORDER — ACETAMINOPHEN 325 MG PO TABS
650.0000 mg | ORAL_TABLET | Freq: Four times a day (QID) | ORAL | Status: DC | PRN
  Administered 2014-11-01 – 2014-11-03 (×5): 650 mg via ORAL
  Filled 2014-10-31 (×5): qty 2

## 2014-10-31 MED ORDER — PNEUMOCOCCAL VAC POLYVALENT 25 MCG/0.5ML IJ INJ
0.5000 mL | INJECTION | INTRAMUSCULAR | Status: AC
  Administered 2014-11-01: 0.5 mL via INTRAMUSCULAR

## 2014-10-31 MED ORDER — ALUM & MAG HYDROXIDE-SIMETH 200-200-20 MG/5ML PO SUSP
30.0000 mL | ORAL | Status: DC | PRN
  Administered 2014-11-02 – 2014-11-04 (×2): 30 mL via ORAL
  Filled 2014-10-31 (×2): qty 30

## 2014-10-31 MED ORDER — HYDROXYZINE HCL 50 MG PO TABS
50.0000 mg | ORAL_TABLET | Freq: Four times a day (QID) | ORAL | Status: DC | PRN
  Administered 2014-10-31 – 2014-11-03 (×2): 50 mg via ORAL
  Filled 2014-10-31 (×2): qty 1

## 2014-10-31 MED ORDER — INFLUENZA VAC SPLIT QUAD 0.5 ML IM SUSY
0.5000 mL | PREFILLED_SYRINGE | INTRAMUSCULAR | Status: AC
  Administered 2014-11-01: 0.5 mL via INTRAMUSCULAR
  Filled 2014-10-31: qty 0.5

## 2014-10-31 MED ORDER — KETOROLAC TROMETHAMINE 60 MG/2ML IM SOLN
60.0000 mg | Freq: Once | INTRAMUSCULAR | Status: AC
  Administered 2014-10-31: 60 mg via INTRAMUSCULAR
  Filled 2014-10-31: qty 2

## 2014-10-31 MED ORDER — MAGNESIUM HYDROXIDE 400 MG/5ML PO SUSP: 30.0000 mL | Freq: Every day | ORAL | Status: DC | PRN

## 2014-10-31 NOTE — ED Notes (Signed)
Report given to nurse on [address]. Suzanne Klein arrived to transport pt.
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
## 2014-10-31 NOTE — ED Notes (Signed)
Pt has bed at [facility], sent here for med clearance. 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 Reports chronic left arm pain 6/10. Denies SI/HI, AH/VH.

## 2014-10-31 NOTE — ED Provider Notes (Signed)
CSN: [id number]     Arrival date & time 10/31/14  1407 History   First MD Initiated Contact with Patient 10/31/14 1451     Chief Complaint  Patient presents with  . medical clearance      (Consider location/radiation/quality/duration/timing/severity/associated sxs/prior Treatment) HPI Patient is here for evaluation of depression and mood alteration.  The patient states she was over at behavioral health and was sent here for medical clearance.  Patient does not give a very good history [facility] in [place] about left arm pain that is chronic for her.  Patient states she was seen in the emergency department for this recently and was given medications which she states helped.  The patient does not speak much about her mental health issues to me Past Medical History  Diagnosis Date  . HPV in female   . Migraines   . HPV in female   . Abnormal Pap smear   . Termination of pregnancy     x 1  . PTSD (post-traumatic stress disorder)     No meds  . Borderline personality disorder     No meds  . Panic anxiety syndrome     no meds  . Heartburn in pregnancy   . Scoliosis of lumbar spine    Past Surgical History  Procedure Laterality Date  . Cesarean section      x 2  . Colposcopy    . Wisdom tooth extraction    . Dilation and curettage of uterus    . Cesarean section  07/21/2012    Procedure: CESAREAN SECTION;  Surgeon: Frederico Hamman, MD;  Location: [facility];  Service: Obstetrics;  Laterality: N/A;  Repeat Cesarean Section Delivery Boy @ [phone], Apgars 9/9  . Cesarean section     Family History  Problem Relation Age of Onset  . Cancer Mother   . Heart disease Mother   . Anesthesia problems Neg Hx    History  Substance Use Topics  . Smoking status: Current Every Day Smoker -- 0.50 packs/day for 13 years    Types: Cigarettes  . Smokeless tobacco: Not on file  . Alcohol Use: No   OB History    Gravida Para Term Preterm AB TAB SAB Ectopic Multiple Living   5 3 2 1 1 1  0 0 0 3     Review of Systems Level V caveat applies due to uncooperativeness Allergies  Poison oak extract; Banana; Latex; Poison ivy extract; and Tomato  Home Medications   Prior to Admission medications   Medication Sig Start Date End Date Taking? Authorizing Provider  diphenhydramine-acetaminophen (TYLENOL PM) 25-500 MG TABS Take 1-2 tablets by mouth at bedtime as needed (for sleep).   Yes Historical Provider, MD  gabapentin (NEURONTIN) 300 MG capsule Take 1 capsule (300 mg total) by mouth 3 (three) times daily. 10/30/14  Yes Noland Fordyce, PA-C  meloxicam (MOBIC) 7.5 MG tablet Take 1-2 tablets (7.5-15 mg total) by mouth daily. Patient not taking: Reported on 10/31/2014 10/30/14   Noland Fordyce, PA-C  metoCLOPramide (REGLAN) 10 MG tablet Take 1 tablet (10 mg total) by mouth every 8 (eight) hours as needed (headache). Patient not taking: Reported on 10/31/2014 10/13/14   [name], MD  oxyCODONE-acetaminophen (PERCOCET/ROXICET) 5-325 MG per tablet Take 2 tablets by mouth every 4 (four) hours as needed. Patient not taking: Reported on 10/31/2014 09/05/14   [name], MD  traMADol (ULTRAM) 50 MG tablet Take 1 tablet (50 mg total) by mouth every 6 (six) hours as  needed. 10/30/14   Noland Fordyce, PA-C   BP 99/56 mmHg  Pulse 67  Temp(Src) 98.1 F (36.7 C) (Oral)  Resp 20  SpO2 100%  LMP 10/20/2014 Physical Exam  Constitutional: She is oriented to person, place, and time. She appears well-developed and well-nourished. No distress.  HENT:  Head: Normocephalic and atraumatic.  Mouth/Throat: Oropharynx is clear and moist.  Eyes: Pupils are equal, round, and reactive to light.  Neck: Normal range of motion. Neck supple.  Cardiovascular: Normal rate, regular rhythm and normal heart sounds.  Exam reveals no gallop and no friction rub.   No murmur heard. Pulmonary/Chest: Effort normal and breath sounds normal. No respiratory distress.  Musculoskeletal: She exhibits no edema.  Neurological: She is alert and  oriented to person, place, and time.  Skin: Skin is warm and dry.  Psychiatric: She exhibits a depressed mood. She expresses suicidal ideation.  Nursing note and vitals reviewed.   ED Course  Procedures (including critical care time) Labs Review Labs Reviewed  ACETAMINOPHEN LEVEL  CBC  COMPREHENSIVE METABOLIC PANEL  ETHANOL  SALICYLATE LEVEL  URINE RAPID DRUG SCREEN (HOSP PERFORMED)    Imaging Review Dg Elbow Complete Left  10/30/2014   CLINICAL DATA:  Initial encounter for fall yesterday with elbow pain.  EXAM: LEFT ELBOW - COMPLETE 3+ VIEW  COMPARISON:  None.  FINDINGS: There is no evidence of fracture, dislocation, or joint effusion. There is no evidence of arthropathy or other focal bone abnormality. Soft tissues are unremarkable.  IMPRESSION: Negative.   Electronically Signed   By: Misty Stanley M.D.   On: 10/30/2014 14:40   Dg Shoulder Left  10/30/2014   CLINICAL DATA:  Initial encounter for pain in shoulder after fall yesterday.  EXAM: LEFT SHOULDER - 2+ VIEW  COMPARISON:  None.  FINDINGS: There is no evidence of fracture or dislocation. There is no evidence of arthropathy or other focal bone abnormality. Soft tissues are unremarkable.  IMPRESSION: Negative.   Electronically Signed   By: Misty Stanley M.D.   On: 10/30/2014 14:40   patient will have her screening laboratory tests done and the TTS workers will decide her placement  MDM   Final diagnoses:  None       Brent General, PA-C 11/01/14 4742  [name], MD 11/12/14 [phone]

## 2014-10-31 NOTE — ED Notes (Signed)
[facility] called for pt transport back to [facility].

## 2014-11-01 ENCOUNTER — Encounter (HOSPITAL_COMMUNITY): Payer: Self-pay | Admitting: *Deleted

## 2014-11-01 DIAGNOSIS — R45851 Suicidal ideations: Secondary | ICD-10-CM

## 2014-11-01 DIAGNOSIS — F418 Other specified anxiety disorders: Secondary | ICD-10-CM

## 2014-11-01 DIAGNOSIS — F4310 Post-traumatic stress disorder, unspecified: Secondary | ICD-10-CM | POA: Diagnosis present

## 2014-11-01 DIAGNOSIS — F411 Generalized anxiety disorder: Secondary | ICD-10-CM

## 2014-11-01 DIAGNOSIS — F410 Panic disorder [episodic paroxysmal anxiety] without agoraphobia: Secondary | ICD-10-CM

## 2014-11-01 MED ORDER — ENSURE COMPLETE PO LIQD
237.0000 mL | ORAL | Status: DC
  Administered 2014-11-01 – 2014-11-03 (×2): 237 mL via ORAL

## 2014-11-01 MED ORDER — NICOTINE 21 MG/24HR TD PT24
21.0000 mg | MEDICATED_PATCH | Freq: Every day | TRANSDERMAL | Status: DC
  Administered 2014-11-01 – 2014-11-04 (×4): 21 mg via TRANSDERMAL
  Filled 2014-11-01 (×6): qty 1

## 2014-11-01 MED ORDER — VENLAFAXINE HCL ER 37.5 MG PO CP24
37.5000 mg | ORAL_CAPSULE | Freq: Every day | ORAL | Status: DC
  Administered 2014-11-02 – 2014-11-03 (×2): 37.5 mg via ORAL
  Filled 2014-11-01 (×4): qty 1

## 2014-11-01 MED ORDER — GABAPENTIN 100 MG PO CAPS
100.0000 mg | ORAL_CAPSULE | Freq: Three times a day (TID) | ORAL | Status: DC
  Administered 2014-11-01 – 2014-11-04 (×9): 100 mg via ORAL
  Filled 2014-11-01 (×13): qty 1

## 2014-11-01 MED ORDER — GABAPENTIN 300 MG PO CAPS
300.0000 mg | ORAL_CAPSULE | Freq: Every day | ORAL | Status: DC
  Administered 2014-11-01 – 2014-11-03 (×3): 300 mg via ORAL
  Filled 2014-11-01 (×5): qty 1

## 2014-11-01 NOTE — Progress Notes (Signed)
Patient ID: Suzanne Klein, female   DOB: 04-Jul-1981, 34 y.o.   MRN: [id number]  Pt admitted to unit from [facility] ED. Pt stable and in no current distress. Vitals completed and WNL. Skin and belongings search completed. Consents reviewed and signed. Pt oriented to unit and handoff report given to receiving RN.

## 2014-11-01 NOTE — Progress Notes (Signed)
Patient ID: Suzanne Klein, female   DOB: 25-Mar-1981, 34 y.o.   MRN: [id number] Client is a 34 yo female admitted for medication stabilization, "I been of medication for 3-4 weeks, because I didn't have the $3 co-pay to get any" "I been off my medicine don't like the ways I'm feeling, sad, depressed" "I want to get on medication that actually work the ones I was on was like sugar pills, made me feel worse" "lost custody of my babies won't be able to see them until they are 35 yo." "they will be adopted out, they took my rights away" "they say I wasn't mentally or financially able to take care of them" "but now I been approved for disability and I found a place to stay" "I have problems with neuropathy low back pain" Client has been at [facility] last admission was in 2013. 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 Client denies AVH, SHI. Client is cooperative, tearful at times, noted some anxiety. Staff will maintain safety with q61min safety checks. Client is safe on the unit.

## 2014-11-01 NOTE — Progress Notes (Signed)
D: Patient is A&Ox4, denies SI/HI, A/V hallucination. Patient is very guarded and isolative with a flat affect.  A: Patient is given verbal and emotional support prn, patient given medication as scheduled and prn. Continue q15 minute safety checks. R: Patient remains safe. Patient verbalizes willingness to find staff if she can no longer contract for safety or if she needs anything.  Sharlynn Seckinger, [place]

## 2014-11-01 NOTE — Progress Notes (Signed)
Adult Psychoeducational Group Note  Date:  11/01/2014 Time:  4:10 PM  Group Topic/Focus:  Goals Group:   The focus of this group is to help patients establish daily goals to achieve during treatment and discuss how the patient can incorporate goal setting into their daily lives to aide in recovery.  Participation Level:  Active  Participation Quality:  Appropriate  Affect:  Appropriate  Cognitive:  Appropriate  Insight: Appropriate  Engagement in Group:  Engaged  Modes of Intervention:  Discussion  Additional Comments:  Pt stated her goal for the day was to keep her anxiety and depression under control.  Tonia Brooms D 11/01/2014, 4:10 PM
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
## 2014-11-01 NOTE — BHH Group Notes (Signed)
Adult Psychoeducational Group Note  Date:  11/01/2014 Time:  11:52 PM  Group Topic/Focus:  AA Meeting  Participation Level:  Minimal  Participation Quality:  Attentive  Affect:  Depressed and Tearful  Cognitive:  Appropriate  Insight: Limited  Engagement in Group:  Limited  Modes of Intervention:  Discussion and Education  Additional Comments:  Liadan attended group.  Victorino Sparrow A 11/01/2014, 11:52 PM
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
## 2014-11-01 NOTE — Progress Notes (Signed)
Patient did attend half of  the evening karaoke group but sat alone in the back and looked at her book. Pt arrived late and requested to go back early.

## 2014-11-01 NOTE — Tx Team (Signed)
Initial Interdisciplinary Treatment Plan   PATIENT STRESSORS: Health problems Loss of children taken by DSS Medication change or noncompliance   PATIENT STRENGTHS: Communication skills General fund of knowledge Supportive family/friends   PROBLEM LIST: Problem List/Patient Goals Date to be addressed Date deferred Reason deferred Estimated date of resolution  "been off my medication for 3-4 weeks"  10-31-14     "lost custody of my babies" 10-31-14     "neuropathy" 10-31-14     "don't like the way I'm feeling, sad, depressed." 10-31-14                                    DISCHARGE CRITERIA:  Improved stabilization in mood, thinking, and/or behavior Motivation to continue treatment in a less acute level of care Verbal commitment to aftercare and medication compliance  PRELIMINARY DISCHARGE PLAN: Outpatient therapy Return to previous living arrangement  PATIENT/FAMIILY INVOLVEMENT: This treatment plan has been presented to and reviewed with the patient, Suzanne Klein, and/or family member.  The patient and family have been given the opportunity to ask questions and make suggestions.  Zoe Lan 11/01/2014, 12:14 AM
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
## 2014-11-01 NOTE — B[facility] Group Notes (Signed)
[facility] LCSW Aftercare Discharge Planning Group Note   11/01/2014 10:00 AM    Participation Quality:  Appropraite  Mood/Affect:  Depressed, Flat, Guarded  Depression Rating:  10  Anxiety Rating:  10  Thoughts of Suicide:  No  Will you contract for safety?   NA  Current AVH:  No  Plan for Discharge/Comments:  Patient attended discharge planning group and actively participated in group. Patient very guarded but advised of being followed by the [facility]. Suicide prevention education reviewed and SPE document provided.   Transportation Means: Patient has transportation.   Supports:  Patient has a support system.   Lucine Bilski, Eulas Post

## 2014-11-01 NOTE — Progress Notes (Signed)
NUTRITION ASSESSMENT  Pt identified as at risk on the Malnutrition Screen Tool  INTERVENTION: 1. Supplements: Ensure Complete po daily, each supplement provides 350 kcal and 13 grams of protein 2. Encourage PO intake  NUTRITION DIAGNOSIS: Unintentional weight loss related to sub-optimal intake as evidenced by pt report.   Goal: Pt to meet >/= 90% of their estimated nutrition needs.  Monitor:  PO intake  Assessment:  Pt admitted with depression and anxiety.  Per weight history documentation, pt has had a 7 lb weight loss since 12/19 (3% weight loss x 3 weeks). Per screening, pt reports decreased appetite. Pt may benefit from nutritional supplementation. RD to order Ensure supplements daily.   Height: Ht Readings from Last 1 Encounters:  10/31/14 5\' 7"  (1.702 m)    Weight: Wt Readings from Last 1 Encounters:  10/31/14 204 lb 4 oz (92.647 kg)    Weight Hx: Wt Readings from Last 10 Encounters:  10/31/14 204 lb 4 oz (92.647 kg)  10/12/14 211 lb (95.709 kg)  06/28/14 210 lb (95.255 kg)  08/21/13 219 lb (99.338 kg)  08/20/13 218 lb (98.884 kg)  08/15/13 219 lb (99.338 kg)  08/07/13 220 lb (99.791 kg)  08/01/13 219 lb 12.8 oz (99.701 kg)  07/19/13 220 lb 3.2 oz (99.882 kg)  07/19/13 219 lb (99.338 kg)    BMI:  Body mass index is 31.98 kg/(m^2). Pt meets criteria for obesity based on current BMI.  Estimated Nutritional Needs: Kcal: 25-30 kcal/kg Protein: > 1 gram protein/kg Fluid: 1 ml/kcal  Diet Order: Diet regular Pt is also offered choice of unit snacks mid-morning and mid-afternoon.  Pt is eating as desired.   Lab results and medications reviewed.   Clayton Bibles, MS, RD, LDN Pager: [phone] After Hours Pager: [phone]

## 2014-11-01 NOTE — [facility] Group Notes (Signed)
[facility] LCSW Group Therapy  Feelings Around Relapse 1:15 -2:30        11/01/2014 3:23 PM   Type of Therapy:  Group Therapy  Participation Level:  Appropriate  Participation Quality:  Appropriate  Affect:  Appropriate  Cognitive:  Attentive Appropriate  Insight:  Developing/Improving  Engagement in Therapy: Developing/Improving  Modes of Intervention:  Discussion Exploration Problem-Solving Supportive  Summary of Progress/Problems:  The topic for today was feelings around relapse.  Patient processed feelings toward relapse and was able to relate to peers. She advised relapsing for her is getting inside of her head with negative thoughts.  Patient identified coping skills that can be used to prevent a relapse as painting and listening to music.   Concha Pyo 11/01/2014 3:23 PM

## 2014-11-02 DIAGNOSIS — F4310 Post-traumatic stress disorder, unspecified: Secondary | ICD-10-CM

## 2014-11-02 DIAGNOSIS — F322 Major depressive disorder, single episode, severe without psychotic features: Secondary | ICD-10-CM

## 2014-11-02 MED ORDER — PRAZOSIN HCL 1 MG PO CAPS
1.0000 mg | ORAL_CAPSULE | Freq: Every day | ORAL | Status: DC
  Administered 2014-11-02: 1 mg via ORAL
  Filled 2014-11-02 (×4): qty 1

## 2014-11-02 NOTE — Progress Notes (Signed)
D: Pt has blunted affect and depressed, anxious mood.  Pt reports her goal today was "to try to release some pressure and anxiety."  Pt reports drawing as a positive coping skill.  She denies SI/HI, denies hallucinations.  Pt attended evening group.  She is guarded when interacting with others.   A: Medications administered per order.  PRN medication administered for sleep, see flowsheet.  Safety maintained.  Met with pt 1:1 and provided support and encouragement.  Praised pt for using positive coping skill.   R: Pt is compliant with medications.  Pt verbally contracts for safety.  She reports she will notify staff of needs and concerns.  Will continue to monitor and assess for safety.

## 2014-11-02 NOTE — Progress Notes (Signed)
[facility] MD Progress Note  11/02/2014 3:37 PM Suzanne Klein  MRN:  [id number] Subjective:  Pt interviewed, chart reviewed. Pt feels "like a piece of me is missing". Pt is focused on not having custody of her children, and they are supposed to be adopted. Mood is "empty, like a shell". Pt requesting either xanax or klonopin, since was effective in past for anxiety attacks. Pt reports heartburn, headaches, nausea, diarrhea, since starting effexor. Pt reports experiencing a panic attack at lunch today. Pt reports having a nightmare last night.  Diagnosis:   DSM5: Trauma-Stressor Disorders:  Posttraumatic Stress Disorder (309.81)  Depressive Disorders:  Major Depressive Disorder - Severe (296.23) Total Time spent with patient: 30 minutes    ADL's:  Intact  Sleep: Poor  Appetite:  Fair  Suicidal Ideation:  denies Homicidal Ideation:  denies AEB (as evidenced by):  Psychiatric Specialty Exam: Physical Exam  ROS  Blood pressure 110/66, pulse 91, temperature 98 F (36.7 C), temperature source Oral, resp. rate 18, height 5\' 7"  (1.702 m), weight 92.647 kg (204 lb 4 oz), last menstrual period 10/20/2014.Body mass index is 31.98 kg/(m^2).  General Appearance: Disheveled  Eye Contact::  Poor  Speech:  Normal Rate  Volume:  Normal  Mood:  Anxious and Depressed  Affect:  Congruent, Depressed and Restricted  Thought Process:  Tangential  Orientation:  Negative  Thought Content:  Negative  Suicidal Thoughts:  No  Homicidal Thoughts:  No  Memory:  Negative  Judgement:  Poor  Insight:  Shallow  Psychomotor Activity:  Normal  Concentration:  Poor  Recall:  [place] of Knowledge:Fair  Language: Fair  Akathisia:  Negative  Handed:  Right  AIMS (if indicated):     Assets:  Communication Skills Desire for Improvement Housing Social Support  Sleep:  Number of Hours: 6.25   Musculoskeletal: Strength & Muscle Tone: within normal limits Gait & Station: normal Patient leans:  N/A  Current Medications: Current Facility-Administered Medications  Medication Dose Route Frequency Provider Last Rate Last Dose  . acetaminophen (TYLENOL) tablet 650 mg  650 mg Oral Q6H PRN Malena Peer, NP   650 mg at 11/02/14 0253  . alum & mag hydroxide-simeth (MAALOX/MYLANTA) 200-200-20 MG/5ML suspension 30 mL  30 mL Oral Q4H PRN Malena Peer, NP   30 mL at 11/02/14 1201  . feeding supplement (ENSURE COMPLETE) (ENSURE COMPLETE) liquid 237 mL  237 mL Oral Q24H Clayton Bibles, RD   237 mL at 11/01/14 1948  . gabapentin (NEURONTIN) capsule 100 mg  100 mg Oral TID Nicholaus Bloom, MD   100 mg at 11/02/14 1202  . gabapentin (NEURONTIN) capsule 300 mg  300 mg Oral QHS Nicholaus Bloom, MD   300 mg at 11/01/14 2141  . hydrOXYzine (ATARAX/VISTARIL) tablet 50 mg  50 mg Oral Q6H PRN Malena Peer, NP   50 mg at 10/31/14 2249  . magnesium hydroxide (MILK OF MAGNESIA) suspension 30 mL  30 mL Oral Daily PRN Evanna Cori Burkett, NP      . nicotine (NICODERM CQ - dosed in mg/24 hours) patch 21 mg  21 mg Transdermal Daily Ursula Alert, MD   21 mg at 11/02/14 0821  . traZODone (DESYREL) tablet 50 mg  50 mg Oral QHS PRN,MR X 1 Evanna Cori Greig Castilla, NP   50 mg at 11/01/14 2139  . venlafaxine XR (EFFEXOR-XR) 24 hr capsule 37.5 mg  37.5 mg Oral Q breakfast Nicholaus Bloom, MD   37.5 mg at 11/02/14 [phone]  Lab Results: No results found for this or any previous visit (from the past 48 hour(s)).  Physical Findings: AIMS: Facial and Oral Movements Muscles of Facial Expression: None, normal Lips and Perioral Area: None, normal Jaw: None, normal Tongue: None, normal,Extremity Movements Upper (arms, wrists, hands, fingers): None, normal Lower (legs, knees, ankles, toes): None, normal, Trunk Movements Neck, shoulders, hips: None, normal, Overall Severity Severity of abnormal movements (highest score from questions above): None, normal Incapacitation due to abnormal movements: None,  normal Patient's awareness of abnormal movements (rate only patient's report): No Awareness, Dental Status Current problems with teeth and/or dentures?: No Does patient usually wear dentures?: No  CIWA:  CIWA-Ar Total: 0 COWS:     Treatment Plan Summary: Daily contact with patient to assess and evaluate symptoms and progress in treatment Medication management will start prazosin for nightmares. continue neurontin and effexor.  Plan:  Medical Decision Making Problem Points:  Established problem, stable/improving (1), Review of last therapy session (1) and Review of psycho-social stressors (1) Data Points:  Review and summation of old records (2) Review of medication regiment & side effects (2)  I certify that inpatient services furnished can reasonably be expected to improve the patient's condition.   Dereck Leep 11/02/2014, 3:37 PM

## 2014-11-02 NOTE — Progress Notes (Signed)
Psychoeducational Group Note  Date: 11/02/2014 Time:  1015  Group Topic/Focus:  Identifying Needs:   The focus of this group is to help patients identify their personal needs that have been historically problematic and identify healthy behaviors to address their needs.  Participation Level:  Active  Participation Quality:  Appropriate  Affect:  Appropriate  Cognitive:  Oriented  Insight:  Improving  Engagement in Group:  Engaged  Additional Comments:  Pt participated and was involved in the discussion.  Eithel Ryall A 
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
## 2014-11-02 NOTE — Progress Notes (Signed)
.  Psychoeducational Group Note    Date: 11/02/2014 Time: 0930   Goal Setting Purpose of Group: To be able to set a goal that is measurable and that can be accomplished in one day Participation Level:  Active  Participation Quality:  Appropriate  Affect:  Appropriate  Cognitive:  Oriented  Insight:  Improving  Engagement in Group:  Engaged  Additional Comments:  Pt was engaged and involved in the group. Participation good.  Vaudine Dutan A  

## 2014-11-02 NOTE — Plan of Care (Signed)
Problem: Alteration in mood Goal: LTG-Patient reports reduction in suicidal thoughts (Patient reports reduction in suicidal thoughts and is able to verbalize a safety plan for whenever patient is feeling suicidal)  Outcome: Progressing Pt denied SI this shift.  She verbally contracted for safety.

## 2014-11-03 MED ORDER — VENLAFAXINE HCL ER 75 MG PO CP24
75.0000 mg | ORAL_CAPSULE | Freq: Every day | ORAL | Status: DC
  Administered 2014-11-04: 75 mg via ORAL
  Filled 2014-11-03 (×3): qty 1

## 2014-11-03 MED ORDER — PRAZOSIN HCL 2 MG PO CAPS
2.0000 mg | ORAL_CAPSULE | Freq: Every day | ORAL | Status: DC
  Administered 2014-11-03: 2 mg via ORAL
  Filled 2014-11-03 (×3): qty 1

## 2014-11-03 NOTE — Care Management (Signed)
*++     Suzanne Klein is continuing to handle being in the hospital. She takes her medications as scheduled and she completes her daily assessment sheet, on it she writes she denies SI within the past 24 hrs and she rates her depression, hopelessness and anxiety as "9/0/8", respectively.    A She is engaged in her recovery as evidenced by her attendance to her groups. She engages in her Life SKills group and she participates in the discussion, sharing life experiences with the group as well as things she has learned about mental illness. She is started on po minipress tonight ( due to c/o nightmares ) and her effexor will be increased ( tomorrow ).    R Safety in place.

## 2014-11-03 NOTE — Progress Notes (Signed)
Patient ID: Suzanne Klein, female   DOB: [birth year], 34 y.o.   MRN: [id number] D)  Pt was irritable this evening, stated was upset with her husband that he hadn't come to visit.   Attended group this evening prior to calling him, and verbalized in group that she felt upset with him, but was calmer after talking to him, apologized for her attitude. A)  Will continue to monitor for safety, continue POC R)  Compliant with program, meds.  Safety maintained.

## 2014-11-03 NOTE — Progress Notes (Signed)
Suzanne Klein is still in denial , saying over and over " I'm ready to go home...". She completes her morning assessment  And on it she writes she denies SI within the past 24 hrs and she rated her depression, hopelessness and anxiety she rates " 9 / 0 / 8"  respectively.   A She  Takes her meds according to schedule and she shares she is getting new medication tonight..per orders POC, her effexor has been ordered to be increased and her xanax is dc Suzanne and she is started on logiun`   R POC maintained and cont.Suzanne

## 2014-11-03 NOTE — Progress Notes (Signed)
Psychoeducational Group Note  Date:  11/03/2014 Time:  1015  Group Topic/Focus:  Making Healthy Choices:   The focus of this group is to help patients identify negative/unhealthy choices they were using prior to admission and identify positive/healthier coping strategies to replace them upon discharge.  Participation Level:  Active  Participation Quality:  Appropriate  Affect:  Appropriate  Cognitive:  Alert  Insight:  Improving  Engagement in Group:  Engaged  Additional Comments:  Pt participated and was focused on the topics presented. Gaining insight and looking into his behavior  Paulino Rily 11/03/2014

## 2014-11-03 NOTE — Progress Notes (Signed)
Patient did not attend the evening speaker AA meeting.  

## 2014-11-03 NOTE — [facility] Group Notes (Signed)
[facility] Group Notes:  (Nursing/MHT/Case Management/Adjunct)  Date:  11/03/2014  Time:  3:01 PM  Type of Therapy:  Psychoeducational Skills  Participation Level:  Active  Participation Quality:  Appropriate  Affect:  Appropriate  Cognitive:  Appropriate  Insight:  Appropriate  Engagement in Group:  Engaged  Modes of Intervention:  Discussion  Summary of Progress/Problems: Pt did attend self inventory group, pt reported that she was negative SI/HI, no AH/VH noted. Pt rated her depression as a 10, and her helplessness/hopelessness as a 5.     Pt reported no issues or concerns.   Klein Deeds Suzanne 11/03/2014, 3:01 PM

## 2014-11-03 NOTE — [facility] Group Notes (Signed)
[facility] LCSW Group Therapy  11/03/2014 1:15 PM   Type of Therapy:  Group Therapy  Participation Level:  Did Not Attend  Regan Lemming, LCSW 11/03/2014 3:03 PM

## 2014-11-03 NOTE — Progress Notes (Addendum)
[facility] MD Progress Note  11/03/2014 2:52 PM Suzanne Klein  MRN:  [id number] Subjective:  Pt interviewed, chart reviewed. Pt feels "a lot better than when I first came in", but still is depressed/irritable. Pt is focused on not having custody of her children, and they are supposed to be adopted. Mood is "angry" about losing her kids. Pt reports chronic heartburn and headaches. Tolerating meds. No panic attacks today. Pt reports having a nightmare last night.  Diagnosis:   DSM5: Trauma-Stressor Disorders:  Posttraumatic Stress Disorder (309.81)  Depressive Disorders:  Major Depressive Disorder - Severe (296.23) Total Time spent with patient: 30 minutes    ADL's:  Intact  Sleep: Poor  Appetite:  Fair  Suicidal Ideation:  denies Homicidal Ideation:  denies AEB (as evidenced by):  Psychiatric Specialty Exam: Physical Exam  ROS  Blood pressure 101/81, pulse 105, temperature 98.7 F (37.1 C), temperature source Oral, resp. rate 17, height 5\' 7"  (1.702 m), weight 92.647 kg (204 lb 4 oz), last menstrual period 10/20/2014.Body mass index is 31.98 kg/(m^2).  General Appearance: Disheveled  Eye Contact::  Poor  Speech:  Normal Rate  Volume:  Normal  Mood:  Anxious and Depressed  Affect:  Congruent, Depressed and Restricted  Thought Process:  Tangential  Orientation:  Negative  Thought Content:  Negative  Suicidal Thoughts:  No  Homicidal Thoughts:  No  Memory:  Negative  Judgement:  Poor  Insight:  Shallow  Psychomotor Activity:  Normal  Concentration:  Poor  Recall:  [place] of Knowledge:Fair  Language: Fair  Akathisia:  Negative  Handed:  Right  AIMS (if indicated):     Assets:  Communication Skills Desire for Improvement Housing Social Support  Sleep:  Number of Hours: 6   Musculoskeletal: Strength & Muscle Tone: within normal limits Gait & Station: normal Patient leans: N/A  Current Medications: Current Facility-Administered Medications  Medication Dose  Route Frequency Provider Last Rate Last Dose  . acetaminophen (TYLENOL) tablet 650 mg  650 mg Oral Q6H PRN Malena Peer, NP   650 mg at 11/03/14 0834  . alum & mag hydroxide-simeth (MAALOX/MYLANTA) 200-200-20 MG/5ML suspension 30 mL  30 mL Oral Q4H PRN Malena Peer, NP   30 mL at 11/02/14 1201  . feeding supplement (ENSURE COMPLETE) (ENSURE COMPLETE) liquid 237 mL  237 mL Oral Q24H Clayton Bibles, RD   237 mL at 11/01/14 1948  . gabapentin (NEURONTIN) capsule 100 mg  100 mg Oral TID Nicholaus Bloom, MD   100 mg at 11/03/14 1158  . gabapentin (NEURONTIN) capsule 300 mg  300 mg Oral QHS Nicholaus Bloom, MD   300 mg at 11/02/14 2110  . hydrOXYzine (ATARAX/VISTARIL) tablet 50 mg  50 mg Oral Q6H PRN Malena Peer, NP   50 mg at 11/03/14 0835  . magnesium hydroxide (MILK OF MAGNESIA) suspension 30 mL  30 mL Oral Daily PRN Evanna Cori Burkett, NP      . nicotine (NICODERM CQ - dosed in mg/24 hours) patch 21 mg  21 mg Transdermal Daily Ursula Alert, MD   21 mg at 11/03/14 0835  . prazosin (MINIPRESS) capsule 1 mg  1 mg Oral QHS Skip Estimable, MD   1 mg at 11/02/14 2112  . traZODone (DESYREL) tablet 50 mg  50 mg Oral QHS PRN,MR X 1 Evanna Cori Greig Castilla, NP   50 mg at 11/02/14 2110  . venlafaxine XR (EFFEXOR-XR) 24 hr capsule 37.5 mg  37.5 mg Oral Q breakfast Geralyn Flash  A Lugo, MD   37.5 mg at 11/03/14 8022    Lab Results: No results found for this or any previous visit (from the past 48 hour(s)).  Physical Findings: AIMS: Facial and Oral Movements Muscles of Facial Expression: None, normal Lips and Perioral Area: None, normal Jaw: None, normal Tongue: None, normal,Extremity Movements Upper (arms, wrists, hands, fingers): None, normal Lower (legs, knees, ankles, toes): None, normal, Trunk Movements Neck, shoulders, hips: None, normal, Overall Severity Severity of abnormal movements (highest score from questions above): None, normal Incapacitation due to abnormal movements: None,  normal Patient's awareness of abnormal movements (rate only patient's report): No Awareness, Dental Status Current problems with teeth and/or dentures?: No Does patient usually wear dentures?: No  CIWA:  CIWA-Ar Total: 0 COWS:     Treatment Plan Summary: Daily contact with patient to assess and evaluate symptoms and progress in treatment Medication management  Will increase prazosin to 2 mg qhs for nightmares. Will increase effexor xr to 75 mg daily for depression/anxiety, per patient request. Continue neurontin.  Plan:  Medical Decision Making Problem Points:  Established problem, stable/improving (1), Review of last therapy session (1) and Review of psycho-social stressors (1) Data Points:  Review and summation of old records (2) Review of medication regiment & side effects (2)  I certify that inpatient services furnished can reasonably be expected to improve the patient's condition.   Dereck Leep 11/03/2014, 2:52 PM

## 2014-11-04 DIAGNOSIS — F332 Major depressive disorder, recurrent severe without psychotic features: Principal | ICD-10-CM

## 2014-11-04 MED ORDER — GABAPENTIN 100 MG PO CAPS: 100.0000 mg | ORAL_CAPSULE | Freq: Three times a day (TID) | ORAL | Status: DC

## 2014-11-04 MED ORDER — PRAZOSIN HCL 2 MG PO CAPS: 2.0000 mg | ORAL_CAPSULE | Freq: Every day | ORAL | Status: DC

## 2014-11-04 MED ORDER — TRAZODONE HCL 50 MG PO TABS: 50.0000 mg | ORAL_TABLET | Freq: Every evening | ORAL | Status: DC | PRN

## 2014-11-04 MED ORDER — TRAMADOL HCL 50 MG PO TABS: 50.0000 mg | ORAL_TABLET | Freq: Four times a day (QID) | ORAL | Status: DC | PRN

## 2014-11-04 MED ORDER — VENLAFAXINE HCL ER 75 MG PO CP24: 75.0000 mg | ORAL_CAPSULE | Freq: Every day | ORAL | Status: DC

## 2014-11-04 MED ORDER — GABAPENTIN 300 MG PO CAPS: 300.0000 mg | ORAL_CAPSULE | Freq: Every day | ORAL | Status: DC
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
## 2014-11-04 NOTE — Progress Notes (Signed)
Patient ID: Suzanne Klein, female   DOB: 31-Oct-1980, 34 y.o.   MRN: [id number]  Pt currently presents with a flat affect and labile behavior. Pt withdrawn/sullen and happy/smiling throughout the day. Per self inventory, pt rates depression at a 9, hopelessness 0 and anxiety 7. Pt's daily goal is to "to get out of bed, do some art, and breathe" and they intend to do so by "I don't know." Pt reports poor sleep, concentration and appetite.   Pt provided with medications per providers orders. Pt's labs and vitals were monitored throughout the day. Pt supported emotionally and encouraged to express concerns and questions. Pt educated on coping skills for stress managment.  Pt's safety ensured with 15 minute and environmental checks. Pt currently denies SI/HI and A/V hallucinations. Pt verbally agrees to seek staff if SI/HI or A/VH occurs and to consult with staff before acting on these thoughts. Pt inquiring about her discharge plans.

## 2014-11-04 NOTE — [facility] Suicide Risk Assessment (Signed)
[facility] INPATIENT:  Family/Significant Other Suicide Prevention Education  Suicide Prevention Education:  Contact Attempts: mother Suzanne Klein [phone], (name of family member/significant other) has been identified by the patient as the family member/significant other with whom the patient will be residing, and identified as the person(s) who will aid the patient in the event of a mental health crisis.  With written consent from the patient, two attempts were made to provide suicide prevention education, prior to and/or following the patient's discharge.  We were unsuccessful in providing suicide prevention education.  A suicide education pamphlet was given to the patient to share with family/significant other.  Date and time of first attempt: 11/04/14 at 1:20 pm Date and time of second attempt: 11/04/14 at 5:50 pm  Suzanne Klein, [name] 11/04/2014, 5:55 PM

## 2014-11-04 NOTE — Progress Notes (Signed)
Patient ID: Suzanne Klein, female   DOB: 1981-01-02, 34 y.o.   MRN: [id number]  Pt discharged home with two bus passes with a plan to go to her house. Pt was stable and appreciative. All papers and prescriptions were given and valuables returned. Verbal understanding expressed. Denies SI/HI and A/VH. Pt given opportunity to express concerns and ask questions. Pt able to verbalize coping skills for stress management outside of the facility.

## 2014-11-04 NOTE — Progress Notes (Signed)
[facility] Adult Case Management Discharge Plan :  Will you be returning to the same living situation after discharge: Yes,  patient plans to return to previous living situation At discharge, do you have transportation home?:Yes,  patient was provided with 2 bus passes Do you have the ability to pay for your medications:Yes,  patient was provided with prescriptions at discharge  Release of information consent forms completed and in the chart;  Patient's signature needed at discharge.  Patient to Follow up at: Follow-up Information    Follow up with [facility] On 11/08/2014.   Why:  Friday, November 08, 2014 at [place].  Please talk with Eustaquio Maize about being referred to the MD at the [facility] for medications   Contact information:   [address], [place]   [address]  [phone]      Patient denies SI/HI:   Yes,  denies    Safety Planning and Suicide Prevention discussed:  Yes,  with patient    Maiyah Goyne, Rigby Swamy 11/04/2014, 5:56 PM

## 2014-11-04 NOTE — [facility] Group Notes (Signed)
[facility] LCSW Group Therapy 11/04/2014  1:15 pm  Type of Therapy: Group Therapy Participation Level: Active  Participation Quality: Attentive, Sharing and Supportive  Affect: Appropriate  Cognitive: Alert and Oriented  Insight: Developing/Improving and Engaged  Engagement in Therapy: Developing/Improving and Engaged  Modes of Intervention: Clarification, Confrontation, Discussion, Education, Exploration,  Limit-setting, Orientation, Problem-solving, Rapport Building, Art therapist, Socialization and Support  Summary of Progress/Problems: Pt identified obstacles faced currently and processed barriers involved in overcoming these obstacles. Pt identified steps necessary for overcoming these obstacles and explored motivation (internal and external) for facing these difficulties head on. Pt further identified one area of concern in their lives and chose a goal to focus on for today. Patient expressed feelings of anger, hurt, and sadness regarding losing custody of her children. She was able to identify several positive coping skills including utilizing her support system, talking to her therapist, journaling, and listening to music. CSW and other group members provided emotional support and encouragement.  Tilden Fossa, MSW, [place] Worker [facility] [phone]

## 2014-11-04 NOTE — Progress Notes (Signed)
Patient ID: Suzanne Klein, female   DOB: 10-18-1981, 34 y.o.   MRN: [id number] D)  Came to the nursing station at the beginning of the shift and said she was too tired to go to group, wanted her meds so that she could go to bed.  Was told it was too early for meds and was encouraged to go to group.  She went to her room instead until group was over, came out to the dayroom, had a snack, and has been watching tv and interacting with select peers.  States feeling a little better, affect is brighter but states still depressed and upset with her husband, misses her children.  Denies thoughts of self harm. A) Was medicated with tylenol for soreness in her left arm r/t flu shot, also given heat and cold packs to alternate.  Hs been compliant with meds, program.  Will continue to monitor for safety, continue POC R)  Safety maintained.

## 2014-11-04 NOTE — [facility] Group Notes (Signed)
[facility] LCSW Aftercare Discharge Planning Group Note  11/04/2014  8:45 AM  Participation Quality: Did Not Attend. Patient invited to participate but declined.  Tilden Fossa, MSW, [place] Worker [facility] [phone]
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
## 2014-11-06 NOTE — Progress Notes (Signed)
Patient Discharge Instructions:  After Visit Summary (AVS):   Faxed to:  11/06/14 Discharge Summary Note:   Faxed to:  11/06/14 Psychiatric Admission Assessment Note:   Faxed to:  11/06/14 Suicide Risk Assessment - Discharge Assessment:   Faxed to:  11/06/14 Faxed/Sent to the Next Level Care provider:  11/06/14 Faxed to [place] [place], 11/06/2014, 3:53 PM

## 2014-12-23 ENCOUNTER — Emergency Department (HOSPITAL_COMMUNITY)
Admission: EM | Admit: 2014-12-23 | Discharge: 2014-12-23 | Disposition: A | Discharge status: Home / Self Care | Payer: Medicaid Other | Attending: Emergency Medicine | Admitting: Emergency Medicine

## 2014-12-23 ENCOUNTER — Emergency Department (HOSPITAL_COMMUNITY): Payer: Medicaid Other

## 2014-12-23 ENCOUNTER — Encounter (HOSPITAL_COMMUNITY): Payer: Self-pay | Admitting: Family Medicine

## 2014-12-23 DIAGNOSIS — Y92003 Bedroom of unspecified non-institutional (private) residence as the place of occurrence of the external cause: Secondary | ICD-10-CM | POA: Insufficient documentation

## 2014-12-23 DIAGNOSIS — Z8659 Personal history of other mental and behavioral disorders: Secondary | ICD-10-CM | POA: Diagnosis not present

## 2014-12-23 DIAGNOSIS — W010XXA Fall on same level from slipping, tripping and stumbling without subsequent striking against object, initial encounter: Secondary | ICD-10-CM | POA: Insufficient documentation

## 2014-12-23 DIAGNOSIS — G43909 Migraine, unspecified, not intractable, without status migrainosus: Secondary | ICD-10-CM | POA: Diagnosis not present

## 2014-12-23 DIAGNOSIS — Z91040 Latex allergy status: Secondary | ICD-10-CM | POA: Insufficient documentation

## 2014-12-23 DIAGNOSIS — Y998 Other external cause status: Secondary | ICD-10-CM | POA: Diagnosis not present

## 2014-12-23 DIAGNOSIS — M419 Scoliosis, unspecified: Secondary | ICD-10-CM | POA: Insufficient documentation

## 2014-12-23 DIAGNOSIS — Z720 Tobacco use: Secondary | ICD-10-CM | POA: Diagnosis not present

## 2014-12-23 DIAGNOSIS — S99912A Unspecified injury of left ankle, initial encounter: Secondary | ICD-10-CM | POA: Diagnosis present

## 2014-12-23 DIAGNOSIS — S93402A Sprain of unspecified ligament of left ankle, initial encounter: Secondary | ICD-10-CM

## 2014-12-23 DIAGNOSIS — Z8619 Personal history of other infectious and parasitic diseases: Secondary | ICD-10-CM | POA: Insufficient documentation

## 2014-12-23 DIAGNOSIS — Y9301 Activity, walking, marching and hiking: Secondary | ICD-10-CM | POA: Diagnosis not present

## 2014-12-23 DIAGNOSIS — Z79899 Other long term (current) drug therapy: Secondary | ICD-10-CM | POA: Diagnosis not present

## 2014-12-23 MED ORDER — NAPROXEN 500 MG PO TABS: 500.0000 mg | ORAL_TABLET | Freq: Once | ORAL | Status: DC

## 2014-12-23 MED ORDER — NAPROXEN 250 MG PO TABS
500.0000 mg | ORAL_TABLET | Freq: Once | ORAL | Status: AC
  Administered 2014-12-23: 500 mg via ORAL
  Filled 2014-12-23: qty 2

## 2014-12-23 MED ORDER — NAPROXEN 125 MG/5ML PO SUSP: 500.0000 mg | Freq: Two times a day (BID) | ORAL | Status: DC

## 2014-12-23 NOTE — ED Notes (Signed)
Declined W/C at D/C and was escorted to lobby by RN. 

## 2014-12-23 NOTE — ED Notes (Addendum)
Pt here for sprain to left ankle with swelling. sts also bilateral ankle swelling and pain x 1 week.

## 2014-12-23 NOTE — ED Provider Notes (Signed)
CSN: [id number]     Arrival date & time 12/23/14  1116 History  This chart was scribed for non-physician practitioner Comer Locket, working with No att. providers found by Donato Schultz, ED Scribe. This patient was seen in room TR04C/TR04C and the patient's care was started at 11:50 AM.   Chief Complaint  Patient presents with  . Ankle Pain   Patient is a 34 y.o. female presenting with ankle pain. The history is provided by the patient. No language interpreter was used.  Ankle Pain  HPI Comments: Suzanne Klein is a 34 y.o. 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  She states that her knee gave out which caused the fall and both of her ankles twisted inwards.  She rates the pain a 7/10 currently.  She has taken Neurontin with no relief to her symptoms.  She denies numbness as an associated symptom and states that she has to walk with a limp due to pain.  She is allergic to latex.  Past Medical History  Diagnosis Date  . HPV in female   . Migraines   . HPV in female   . Abnormal Pap smear   . Termination of pregnancy     x 1  . PTSD (post-traumatic stress disorder)     No meds  . Borderline personality disorder     No meds  . Panic anxiety syndrome     no meds  . Heartburn in pregnancy   . Scoliosis of lumbar spine    Past Surgical History  Procedure Laterality Date  . Cesarean section      x 2  . Colposcopy    . Wisdom tooth extraction    . Dilation and curettage of uterus    . Cesarean section  07/21/2012    Procedure: CESAREAN SECTION;  Surgeon: Frederico Hamman, MD;  Location: [facility];  Service: Obstetrics;  Laterality: N/A;  Repeat Cesarean Section Delivery Boy @ [phone], Apgars 9/9  . Cesarean section     Family History  Problem  Relation Age of Onset  . Cancer Mother   . Heart disease Mother   . Anesthesia problems Neg Hx    History  Substance Use Topics  . Smoking status: Current Every Day Smoker -- 0.50 packs/day for 13 years    Types: Cigarettes  . Smokeless tobacco: Not on file  . Alcohol Use: No   OB History    Gravida Para Term Preterm AB TAB SAB Ectopic Multiple Living   5 3 2 1 1 1  0 0 0 3     Review of Systems  Musculoskeletal: Positive for joint swelling, arthralgias and gait problem.  Neurological: Negative for numbness.  All other systems reviewed and are negative.     Allergies  Poison oak extract; Banana; Latex; Poison ivy extract; and Tomato  Home Medications   Prior to Admission medications   Medication Sig Start Date End Date Taking? Authorizing Provider  gabapentin (NEURONTIN) 100 MG capsule Take 1 capsule (100 mg total) by mouth 3 (three) times daily. 11/04/14   Elmarie Shiley, NP  gabapentin (NEURONTIN) 300 MG capsule Take 1 capsule (300 mg total) by mouth at bedtime. 11/04/14   Elmarie Shiley, NP  naproxen (NAPROSYN) 500 MG tablet Take 1 tablet (500 mg total)  by mouth once. 12/23/14   Viona Gilmore Viana Sleep, PA-C  prazosin (MINIPRESS) 2 MG capsule Take 1 capsule (2 mg total) by mouth at bedtime. 11/04/14   Elmarie Shiley, NP  traMADol (ULTRAM) 50 MG tablet Take 1 tablet (50 mg total) by mouth every 6 (six) hours as needed. 11/04/14   Elmarie Shiley, NP  traZODone (DESYREL) 50 MG tablet Take 1 tablet (50 mg total) by mouth at bedtime as needed and may repeat dose one time if needed for sleep. 11/04/14   Elmarie Shiley, NP  venlafaxine XR (EFFEXOR-XR) 75 MG 24 hr capsule Take 1 capsule (75 mg total) by mouth daily with breakfast. 11/04/14   Elmarie Shiley, NP   Triage Vitals: BP 120/67 mmHg  Pulse 69  Temp(Src) 99.1 F (37.3 C) (Oral)  Resp 20  SpO2 100%  LMP 11/22/2014  Physical Exam  Constitutional: She is oriented to person, place, and time. She appears well-developed and well-nourished.  HENT:   Head: Normocephalic and atraumatic.  Mouth/Throat: Oropharynx is clear and moist.  Eyes: Conjunctivae are normal. Pupils are equal, round, and reactive to light. Right eye exhibits no discharge. Left eye exhibits no discharge. No scleral icterus.  Neck: Neck supple.  Cardiovascular: Normal rate, regular rhythm, normal heart sounds and intact distal pulses.   Capillary refill less than 2 seconds.  Pulmonary/Chest: Effort normal and breath sounds normal. No respiratory distress. She has no wheezes. She has no rales.  Abdominal: Soft. There is no tenderness.  Musculoskeletal: She exhibits no tenderness.  Left foot - Mild tenderness to posterior aspect of lateral malleolus with some discomfort along the 5th metatarsal.  No obvious erythema or edema.  No other lesions.  Full active range of motion without tenderness to left knee.  Neurological: She is alert and oriented to person, place, and time.  Maintains full active range of motion.    Skin: Skin is warm and dry. No rash noted.  Psychiatric: She has a normal mood and affect.  Nursing note and vitals reviewed.   ED Course  Procedures (including critical care time)  DIAGNOSTIC STUDIES: Oxygen Saturation is 100% on room air, normal by my interpretation.    COORDINATION OF CARE: 11:51 AM- Will obtain an x-ray of the patient's left ankle.  Will discharge the patient with a splint and antiinflammatory medication.  Advised patient to continue RICE therapy.     Labs Review Labs Reviewed - No data to display  Imaging Review Dg Ankle Complete Left  12/23/2014   CLINICAL DATA:  Ankle pain, twisting injury 1 week ago  EXAM: LEFT ANKLE COMPLETE - 3+ VIEW  COMPARISON:  09/05/2014  FINDINGS: There is no evidence of fracture, dislocation, or joint effusion. There is no evidence of arthropathy or other focal bone abnormality. Soft tissues are unremarkable.  IMPRESSION: No acute osseous injury of the left ankle.   Electronically Signed   By: Kathreen Devoid   On: 12/23/2014 13:06   Dg Foot Complete Left  12/23/2014   CLINICAL DATA:  Twisted left foot, pain/swelling  EXAM: LEFT FOOT - COMPLETE 3+ VIEW  COMPARISON:  None.  FINDINGS: No fracture or dislocation is seen.  The joint spaces are preserved.  Mild soft tissue swelling along the dorsal aspect of the ankle.  IMPRESSION: No fracture or dislocation is seen.   Electronically Signed   By: Julian Hy M.D.   On: 12/23/2014 13:05     EKG Interpretation None     Meds given in ED:  Medications  naproxen (NAPROSYN) tablet 500 mg (500 mg Oral Given 12/23/14 1232)    Discharge Medication List as of 12/23/2014  1:30 PM    START taking these medications   Details  naproxen (NAPROSYN) 500 MG tablet Take 1 tablet (500 mg total) by mouth once., Starting 12/23/2014, Print        MDM  Vitals stable - WNL -afebrile Pt resting comfortably in ED. PE--not concerning further acute or emergent pathology Imaging-x-ray of left foot and ankle negative  DDX--patient likely with ankle sprain. Will treat conservatively with anti-inflammatories, cam walker boot for comfort, crutches. Discussed Rice therapy.  I discussed all relevant lab findings and imaging results with pt and they verbalized understanding. Discussed f/u with PCP within 48 hrs and return precautions, pt very amenable to plan.  Final diagnoses:  Ankle sprain, left, initial encounter   I personally performed the services described in this documentation, which was scribed in my presence. The recorded information has been reviewed and is accurate.    Viona Gilmore Milaca, PA-C 12/23/14 [place], MD 12/24/14 1322

## 2014-12-23 NOTE — Discharge Instructions (Signed)

## 2015-03-04 NOTE — H&P (Signed)
L&D Evaluation:  History:   HPI 34 yo [id number] with EDD of 07/27/12   with PNC at [facility] in [place], [place]. Pt called EMS last pm from the shelter where she is staying for a severe HA over her eyes since yest at 8am, No N,V,visual changes. Pt does not feel this is a "migraine and more of a tension HA".    Presents with HA    Patient's Medical History migraines since age 63    Patient's Surgical History none    Medications Pre [place] Vitamins    Social History tobacco  smoker 4 cigs a day previously 1/2 ppd x 13 yrs.    Family History Non-Contributory   ROS:   ROS All systems were reviewed.  HEENT, CNS, GI, GU, Respiratory, CV, Renal and Musculoskeletal systems were found to be normal.   Exam:   Vital Signs stable  BP 99/60    General no apparent distress    Mental Status clear    Chest clear    Heart normal sinus rhythm, no murmur/gallop/rubs    Abdomen gravid, non-tender    Estimated Fetal Weight Average for gestational age    Back no CVAT    Edema 1+    Reflexes 1+    Pelvic not evaluated    Mebranes Intact    FHT +FHR    Ucx absent    Skin dry    Lymph no lymphadenopathy   Impression:   Impression IUP at 23 weeks with HA   Plan:   Plan DC back to shelter by taxi    Comments Pt will f/u with [facility] on 04/13/12.   Electronic Signatures: Catheryn Bacon (CNM)  (Signed 10-Jun-13 08:42)  Authored: L&D Evaluation   Last Updated: 10-Jun-13 08:42 by Catheryn Bacon (CNM)

## 2015-09-20 ENCOUNTER — Encounter (HOSPITAL_COMMUNITY): Payer: Self-pay | Admitting: Neurology

## 2015-09-20 ENCOUNTER — Emergency Department (HOSPITAL_COMMUNITY)
Admission: EM | Admit: 2015-09-20 | Discharge: 2015-09-20 | Disposition: A | Discharge status: Home / Self Care | Payer: Medicaid Other | Attending: Emergency Medicine | Admitting: Emergency Medicine

## 2015-09-20 ENCOUNTER — Emergency Department (HOSPITAL_COMMUNITY): Payer: Medicaid Other

## 2015-09-20 DIAGNOSIS — G43909 Migraine, unspecified, not intractable, without status migrainosus: Secondary | ICD-10-CM | POA: Diagnosis not present

## 2015-09-20 DIAGNOSIS — N939 Abnormal uterine and vaginal bleeding, unspecified: Secondary | ICD-10-CM | POA: Insufficient documentation

## 2015-09-20 DIAGNOSIS — K219 Gastro-esophageal reflux disease without esophagitis: Secondary | ICD-10-CM

## 2015-09-20 DIAGNOSIS — Z8619 Personal history of other infectious and parasitic diseases: Secondary | ICD-10-CM | POA: Diagnosis not present

## 2015-09-20 DIAGNOSIS — F17210 Nicotine dependence, cigarettes, uncomplicated: Secondary | ICD-10-CM | POA: Diagnosis not present

## 2015-09-20 DIAGNOSIS — F4310 Post-traumatic stress disorder, unspecified: Secondary | ICD-10-CM | POA: Diagnosis not present

## 2015-09-20 DIAGNOSIS — Z79899 Other long term (current) drug therapy: Secondary | ICD-10-CM | POA: Insufficient documentation

## 2015-09-20 DIAGNOSIS — J209 Acute bronchitis, unspecified: Secondary | ICD-10-CM

## 2015-09-20 DIAGNOSIS — J069 Acute upper respiratory infection, unspecified: Secondary | ICD-10-CM | POA: Diagnosis not present

## 2015-09-20 DIAGNOSIS — R079 Chest pain, unspecified: Secondary | ICD-10-CM | POA: Insufficient documentation

## 2015-09-20 DIAGNOSIS — M4186 Other forms of scoliosis, lumbar region: Secondary | ICD-10-CM | POA: Insufficient documentation

## 2015-09-20 DIAGNOSIS — R109 Unspecified abdominal pain: Secondary | ICD-10-CM | POA: Insufficient documentation

## 2015-09-20 DIAGNOSIS — Z91040 Latex allergy status: Secondary | ICD-10-CM | POA: Diagnosis not present

## 2015-09-20 DIAGNOSIS — M549 Dorsalgia, unspecified: Secondary | ICD-10-CM | POA: Diagnosis not present

## 2015-09-20 DIAGNOSIS — R0981 Nasal congestion: Secondary | ICD-10-CM | POA: Diagnosis present

## 2015-09-20 DIAGNOSIS — J029 Acute pharyngitis, unspecified: Secondary | ICD-10-CM

## 2015-09-20 DIAGNOSIS — R197 Diarrhea, unspecified: Secondary | ICD-10-CM | POA: Diagnosis not present

## 2015-09-20 LAB — COMPREHENSIVE METABOLIC PANEL
ALT: 19 U/L (ref 14–54)
AST: 20 U/L (ref 15–41)
Albumin: 3.7 g/dL (ref 3.5–5.0)
Alkaline Phosphatase: 70 U/L (ref 38–126)
Anion gap: 7 (ref 5–15)
BUN: 10 mg/dL (ref 6–20)
CHLORIDE: 104 mmol/L (ref 101–111)
CO2: 25 mmol/L (ref 22–32)
CREATININE: 0.73 mg/dL (ref 0.44–1.00)
Calcium: 8.9 mg/dL (ref 8.9–10.3)
GFR calc Af Amer: >60 mL/min (ref >60)
Glucose, Bld: 107 mg/dL — ABNORMAL HIGH (ref 65–99)
POTASSIUM: 4 mmol/L (ref 3.5–5.1)
SODIUM: 136 mmol/L (ref 135–145)
Total Bilirubin: 0.4 mg/dL (ref 0.3–1.2)
Total Protein: 6.9 g/dL (ref 6.5–8.1)

## 2015-09-20 LAB — URINE MICROSCOPIC-ADD ON: BACTERIA UA: NONE SEEN

## 2015-09-20 LAB — CBC
HEMATOCRIT: 42 % (ref 36.0–46.0)
Hemoglobin: 13.7 g/dL (ref 12.0–15.0)
MCH: 31.1 pg (ref 26.0–34.0)
MCHC: 32.6 g/dL (ref 30.0–36.0)
MCV: 95.2 fL (ref 78.0–100.0)
Platelets: 229 10*3/uL (ref 150–400)
RBC: 4.41 MIL/uL (ref 3.87–5.11)
RDW: 14 % (ref 11.5–15.5)
WBC: 6.8 10*3/uL (ref 4.0–10.5)

## 2015-09-20 LAB — URINALYSIS, ROUTINE W REFLEX MICROSCOPIC
GLUCOSE, UA: NEGATIVE mg/dL
Ketones, ur: 15 mg/dL — AB
Nitrite: NEGATIVE
PH: 5 (ref 5.0–8.0)
Protein, ur: 30 mg/dL — AB
Specific Gravity, Urine: 1.028 (ref 1.005–1.030)

## 2015-09-20 LAB — LIPASE, BLOOD: LIPASE: 34 U/L (ref 11–51)

## 2015-09-20 LAB — I-STAT BETA HCG BLOOD, ED (MC, WL, AP ONLY): I-stat hCG, quantitative: <5 m[IU]/mL (ref <5)

## 2015-09-20 LAB — POC OCCULT BLOOD, ED: Fecal Occult Bld: NEGATIVE

## 2015-09-20 LAB — RAPID STREP SCREEN (MED CTR MEBANE ONLY): Streptococcus, Group A Screen (Direct): NEGATIVE

## 2015-09-20 MED ORDER — AZITHROMYCIN 250 MG PO TABS: 250.0000 mg | ORAL_TABLET | Freq: Every day | ORAL | Status: DC

## 2015-09-20 MED ORDER — ALBUTEROL SULFATE (2.5 MG/3ML) 0.083% IN NEBU
5.0000 mg | INHALATION_SOLUTION | Freq: Once | RESPIRATORY_TRACT | Status: AC
  Administered 2015-09-20: 5 mg via RESPIRATORY_TRACT
  Filled 2015-09-20: qty 6

## 2015-09-20 MED ORDER — PREDNISONE 20 MG PO TABS: 40.0000 mg | ORAL_TABLET | Freq: Every day | ORAL | Status: DC

## 2015-09-20 MED ORDER — BENZONATATE 100 MG PO CAPS
200.0000 mg | ORAL_CAPSULE | Freq: Once | ORAL | Status: AC
  Administered 2015-09-20: 200 mg via ORAL
  Filled 2015-09-20: qty 2

## 2015-09-20 MED ORDER — ALBUTEROL SULFATE HFA 108 (90 BASE) MCG/ACT IN AERS
2.0000 | INHALATION_SPRAY | Freq: Once | RESPIRATORY_TRACT | Status: AC
  Administered 2015-09-20: 2 via RESPIRATORY_TRACT
  Filled 2015-09-20: qty 6.7

## 2015-09-20 MED ORDER — IPRATROPIUM BROMIDE 0.02 % IN SOLN
0.5000 mg | Freq: Once | RESPIRATORY_TRACT | Status: AC
  Administered 2015-09-20: 0.5 mg via RESPIRATORY_TRACT
  Filled 2015-09-20: qty 2.5

## 2015-09-20 MED ORDER — PREDNISONE 20 MG PO TABS
60.0000 mg | ORAL_TABLET | Freq: Once | ORAL | Status: AC
  Administered 2015-09-20: 60 mg via ORAL
  Filled 2015-09-20: qty 3

## 2015-09-20 MED ORDER — PANTOPRAZOLE SODIUM 20 MG PO TBEC: 20.0000 mg | DELAYED_RELEASE_TABLET | Freq: Every day | ORAL | Status: DC

## 2015-09-20 MED ORDER — GUAIFENESIN-CODEINE 100-10 MG/5ML PO SOLN: 5.0000 mL | Freq: Three times a day (TID) | ORAL | Status: DC | PRN

## 2015-09-20 MED ORDER — IPRATROPIUM-ALBUTEROL 0.5-2.5 (3) MG/3ML IN SOLN
3.0000 mL | Freq: Four times a day (QID) | RESPIRATORY_TRACT | Status: DC
  Administered 2015-09-20: 3 mL via RESPIRATORY_TRACT
  Filled 2015-09-20 (×2): qty 3

## 2015-09-20 NOTE — Discharge Instructions (Signed)
Acute Bronchitis Bronchitis is inflammation of the airways that extend from the windpipe into the lungs (bronchi). The inflammation often causes mucus to develop. This leads to a cough, which is the most common symptom of bronchitis.  In acute bronchitis, the condition usually develops suddenly and goes away over time, usually in a couple weeks. Smoking, allergies, and asthma can make bronchitis worse. Repeated episodes of bronchitis may cause further lung problems.  CAUSES Acute bronchitis is most often caused by the same virus that causes a cold. The virus can spread from person to person (contagious) through coughing, sneezing, and touching contaminated objects. SIGNS AND SYMPTOMS   Cough.   Fever.   Coughing up mucus.   Body aches.   Chest congestion.   Chills.   Shortness of breath.   Sore throat.  DIAGNOSIS  Acute bronchitis is usually diagnosed through a physical exam. Your health care provider will also ask you questions about your medical history. Tests, such as chest X-rays, are sometimes done to rule out other conditions.  TREATMENT  Acute bronchitis usually goes away in a couple weeks. Oftentimes, no medical treatment is necessary. Medicines are sometimes given for relief of fever or cough. Antibiotic medicines are usually not needed but may be prescribed in certain situations. In some cases, an inhaler may be recommended to help reduce shortness of breath and control the cough. A cool mist vaporizer may also be used to help thin bronchial secretions and make it easier to clear the chest.  HOME CARE INSTRUCTIONS  Get plenty of rest.   Drink enough fluids to keep your urine clear or pale yellow (unless you have a medical condition that requires fluid restriction). Increasing fluids may help thin your respiratory secretions (sputum) and reduce chest congestion, and it will prevent dehydration.   Take medicines only as directed by your health care provider.  If  you were prescribed an antibiotic medicine, finish it all even if you start to feel better.  Avoid smoking and secondhand smoke. Exposure to cigarette smoke or irritating chemicals will make bronchitis worse. If you are a smoker, consider using nicotine gum or skin patches to help control withdrawal symptoms. Quitting smoking will help your lungs heal faster.   Reduce the chances of another bout of acute bronchitis by washing your hands frequently, avoiding people with cold symptoms, and trying not to touch your hands to your mouth, nose, or eyes.   Keep all follow-up visits as directed by your health care provider.  SEEK MEDICAL CARE IF: Your symptoms do not improve after 1 week of treatment.  SEEK IMMEDIATE MEDICAL CARE IF:  You develop an increased fever or chills.   You have chest pain.   You have severe shortness of breath.  You have bloody sputum.   You develop dehydration.  You faint or repeatedly feel like you are going to pass out.  You develop repeated vomiting.  You develop a severe headache. MAKE SURE YOU:   Understand these instructions.  Will watch your condition.  Will get help right away if you are not doing well or get worse.   This information is not intended to replace advice given to you by your health care provider. Make sure you discuss any questions you have with your health care provider.   Document Released: 11/18/2004 Document Revised: 11/01/2014 Document Reviewed: 04/03/2013 Elsevier Interactive Patient Education 2016 [place].  Diarrhea Diarrhea is frequent loose and watery bowel movements. It can cause you to feel weak and dehydrated.  Dehydration can cause you to become tired and thirsty, have a dry mouth, and have decreased urination that often is dark yellow. Diarrhea is a sign of another problem, most often an infection that will not last long. In most cases, diarrhea typically lasts 2-3 days. However, it can last longer if it is a  sign of something more serious. It is important to treat your diarrhea as directed by your caregiver to lessen or prevent future episodes of diarrhea. CAUSES  Some common causes include:  Gastrointestinal infections caused by viruses, bacteria, or parasites.  Food poisoning or food allergies.  Certain medicines, such as antibiotics, chemotherapy, and laxatives.  Artificial sweeteners and fructose.  Digestive disorders. HOME CARE INSTRUCTIONS  Ensure adequate fluid intake (hydration): Have 1 cup (8 oz) of fluid for each diarrhea episode. Avoid fluids that contain simple sugars or sports drinks, fruit juices, whole milk products, and sodas. Your urine should be clear or pale yellow if you are drinking enough fluids. Hydrate with an oral rehydration solution that you can purchase at pharmacies, retail stores, and online. You can prepare an oral rehydration solution at home by mixing the following ingredients together:   - tsp table salt.   tsp baking soda.   tsp salt substitute containing potassium chloride.  1  tablespoons sugar.  1 L (34 oz) of water.  Certain foods and beverages may increase the speed at which food moves through the gastrointestinal (GI) tract. These foods and beverages should be avoided and include:  Caffeinated and alcoholic beverages.  High-fiber foods, such as raw fruits and vegetables, nuts, seeds, and whole grain breads and cereals.  Foods and beverages sweetened with sugar alcohols, such as xylitol, sorbitol, and mannitol.  Some foods may be well tolerated and may help thicken stool including:  Starchy foods, such as rice, toast, pasta, low-sugar cereal, oatmeal, grits, baked potatoes, crackers, and bagels.  Bananas.  Applesauce.  Add probiotic-rich foods to help increase healthy bacteria in the GI tract, such as yogurt and fermented milk products.  Wash your hands well after each diarrhea episode.  Only take over-the-counter or prescription  medicines as directed by your caregiver.  Take a warm bath to relieve any burning or pain from frequent diarrhea episodes. SEEK IMMEDIATE MEDICAL CARE IF:   You are unable to keep fluids down.  You have persistent vomiting.  You have blood in your stool, or your stools are black and tarry.  You do not urinate in 6-8 hours, or there is only a small amount of very dark urine.  You have abdominal pain that increases or localizes.  You have weakness, dizziness, confusion, or light-headedness.  You have a severe headache.  Your diarrhea gets worse or does not get better.  You have a fever or persistent symptoms for more than 2-3 days.  You have a fever and your symptoms suddenly get worse. MAKE SURE YOU:   Understand these instructions.  Will watch your condition.  Will get help right away if you are not doing well or get worse.   This information is not intended to replace advice given to you by your health care provider. Make sure you discuss any questions you have with your health care provider.   Document Released: 10/01/2002 Document Revised: 11/01/2014 Document Reviewed: 06/18/2012 Elsevier Interactive Patient Education 2016 [place].  Gastroesophageal Reflux Disease, Adult Normally, food travels down the esophagus and stays in the stomach to be digested. However, when a person has gastroesophageal reflux disease (GERD), food  and stomach acid move back up into the esophagus. When this happens, the esophagus becomes sore and inflamed. Over time, GERD can create small holes (ulcers) in the lining of the esophagus.  CAUSES This condition is caused by a problem with the muscle between the esophagus and the stomach (lower esophageal sphincter, or LES). Normally, the LES muscle closes after food passes through the esophagus to the stomach. When the LES is weakened or abnormal, it does not close properly, and that allows food and stomach acid to go back up into the esophagus.  The LES can be weakened by certain dietary substances, medicines, and medical conditions, including:  Tobacco use.  Pregnancy.  Having a hiatal hernia.  Heavy alcohol use.  Certain foods and beverages, such as coffee, chocolate, onions, and peppermint. RISK FACTORS This condition is more likely to develop in:  People who have an increased body weight.  People who have connective tissue disorders.  People who use NSAID medicines. SYMPTOMS Symptoms of this condition include:  Heartburn.  Difficult or painful swallowing.  The feeling of having a lump in the throat.  Abitter taste in the mouth.  Bad breath.  Having a large amount of saliva.  Having an upset or bloated stomach.  Belching.  Chest pain.  Shortness of breath or wheezing.  Ongoing (chronic) cough or a night-time cough.  Wearing away of tooth enamel.  Weight loss. Different conditions can cause chest pain. Make sure to see your health care provider if you experience chest pain. DIAGNOSIS Your health care provider will take a medical history and perform a physical exam. To determine if you have mild or severe GERD, your health care provider may also monitor how you respond to treatment. You may also have other tests, including:  An endoscopy toexamine your stomach and esophagus with a small camera.  A test thatmeasures the acidity level in your esophagus.  A test thatmeasures how much pressure is on your esophagus.  A barium swallow or modified barium swallow to show the shape, size, and functioning of your esophagus. TREATMENT The goal of treatment is to help relieve your symptoms and to prevent complications. Treatment for this condition may vary depending on how severe your symptoms are. Your health care provider may recommend:  Changes to your diet.  Medicine.  Surgery. HOME CARE INSTRUCTIONS Diet  Follow a diet as recommended by your health care provider. This may involve avoiding  foods and drinks such as:  Coffee and tea (with or without caffeine).  Drinks that containalcohol.  Energy drinks and sports drinks.  Carbonated drinks or sodas.  Chocolate and cocoa.  Peppermint and mint flavorings.  Garlic and onions.  Horseradish.  Spicy and acidic foods, including peppers, chili powder, curry powder, vinegar, hot sauces, and barbecue sauce.  Citrus fruit juices and citrus fruits, such as oranges, lemons, and limes.  Tomato-based foods, such as red sauce, chili, salsa, and pizza with red sauce.  Fried and fatty foods, such as donuts, french fries, potato chips, and high-fat dressings.  High-fat meats, such as hot dogs and fatty cuts of red and white meats, such as rib eye steak, sausage, ham, and bacon.  High-fat dairy items, such as whole milk, butter, and cream cheese.  Eat small, frequent meals instead of large meals.  Avoid drinking large amounts of liquid with your meals.  Avoid eating meals during the 2-3 hours before bedtime.  Avoid lying down right after you eat.  Do not exercise right after you eat.  General Instructions  Pay attention to any changes in your symptoms.  Take over-the-counter and prescription medicines only as told by your health care provider. Do not take aspirin, ibuprofen, or other NSAIDs unless your health care provider told you to do so.  Do not use any tobacco products, including cigarettes, chewing tobacco, and e-cigarettes. If you need help quitting, ask your health care provider.  Wear loose-fitting clothing. Do not wear anything tight around your waist that causes pressure on your abdomen.  Raise (elevate) the head of your bed 6 inches (15cm).  Try to reduce your stress, such as with yoga or meditation. If you need help reducing stress, ask your health care provider.  If you are overweight, reduce your weight to an amount that is healthy for you. Ask your health care provider for guidance about a safe weight  loss goal.  Keep all follow-up visits as told by your health care provider. This is important. SEEK MEDICAL CARE IF:  You have new symptoms.  You have unexplained weight loss.  You have difficulty swallowing, or it hurts to swallow.  You have wheezing or a persistent cough.  Your symptoms do not improve with treatment.  You have a hoarse voice. SEEK IMMEDIATE MEDICAL CARE IF:  You have pain in your arms, neck, jaw, teeth, or back.  You feel sweaty, dizzy, or light-headed.  You have chest pain or shortness of breath.  You vomit and your vomit looks like blood or coffee grounds.  You faint.  Your stool is bloody or black.  You cannot swallow, drink, or eat.   This information is not intended to replace advice given to you by your health care provider. Make sure you discuss any questions you have with your health care provider.   Document Released: 07/21/2005 Document Revised: 07/02/2015 Document Reviewed: 02/05/2015 Elsevier Interactive Patient Education 2016 [employer]  Upper Respiratory Infection, Adult Most upper respiratory infections (URIs) are a viral infection of the air passages leading to the lungs. A URI affects the nose, throat, and upper air passages. The most common type of URI is nasopharyngitis and is typically referred to as "the common cold." URIs run their course and usually go away on their own. Most of the time, a URI does not require medical attention, but sometimes a bacterial infection in the upper airways can follow a viral infection. This is called a secondary infection. Sinus and middle ear infections are common types of secondary upper respiratory infections. Bacterial pneumonia can also complicate a URI. A URI can worsen asthma and chronic obstructive pulmonary disease (COPD). Sometimes, these complications can require emergency medical care and may be life threatening.  CAUSES Almost all URIs are caused by viruses. A virus is a type of germ and  can spread from one person to another.  RISKS FACTORS You may be at risk for a URI if:   You smoke.   You have chronic heart or lung disease.  You have a weakened defense (immune) system.   You are very young or very old.   You have nasal allergies or asthma.  You work in crowded or poorly ventilated areas.  You work in health care facilities or schools. SIGNS AND SYMPTOMS  Symptoms typically develop 2-3 days after you come in contact with a cold virus. Most viral URIs last 7-10 days. However, viral URIs from the influenza virus (flu virus) can last 14-18 days and are typically more severe. Symptoms may include:   Runny or stuffy (congested)  nose.   Sneezing.   Cough.   Sore throat.   Headache.   Fatigue.   Fever.   Loss of appetite.   Pain in your forehead, behind your eyes, and over your cheekbones (sinus pain).  Muscle aches.  DIAGNOSIS  Your health care provider may diagnose a URI by:  Physical exam.  Tests to check that your symptoms are not due to another condition such as:  Strep throat.  Sinusitis.  Pneumonia.  Asthma. TREATMENT  A URI goes away on its own with time. It cannot be cured with medicines, but medicines may be prescribed or recommended to relieve symptoms. Medicines may help:  Reduce your fever.  Reduce your cough.  Relieve nasal congestion. HOME CARE INSTRUCTIONS   Take medicines only as directed by your health care provider.   Gargle warm saltwater or take cough drops to comfort your throat as directed by your health care provider.  Use a warm mist humidifier or inhale steam from a shower to increase air moisture. This may make it easier to breathe.  Drink enough fluid to keep your urine clear or pale yellow.   Eat soups and other clear broths and maintain good nutrition.   Rest as needed.   Return to work when your temperature has returned to normal or as your health care provider advises. You may need  to stay home longer to avoid infecting others. You can also use a face mask and careful hand washing to prevent spread of the virus.  Increase the usage of your inhaler if you have asthma.   Do not use any tobacco products, including cigarettes, chewing tobacco, or electronic cigarettes. If you need help quitting, ask your health care provider. PREVENTION  The best way to protect yourself from getting a cold is to practice good hygiene.   Avoid oral or hand contact with people with cold symptoms.   Wash your hands often if contact occurs.  There is no clear evidence that vitamin C, vitamin E, echinacea, or exercise reduces the chance of developing a cold. However, it is always recommended to get plenty of rest, exercise, and practice good nutrition.  SEEK MEDICAL CARE IF:   You are getting worse rather than better.   Your symptoms are not controlled by medicine.   You have chills.  You have worsening shortness of breath.  You have brown or red mucus.  You have yellow or brown nasal discharge.  You have pain in your face, especially when you bend forward.  You have a fever.  You have swollen neck glands.  You have pain while swallowing.  You have white areas in the back of your throat. SEEK IMMEDIATE MEDICAL CARE IF:   You have severe or persistent:  Headache.  Ear pain.  Sinus pain.  Chest pain.  You have chronic lung disease and any of the following:  Wheezing.  Prolonged cough.  Coughing up blood.  A change in your usual mucus.  You have a stiff neck.  You have changes in your:  Vision.  Hearing.  Thinking.  Mood. MAKE SURE YOU:   Understand these instructions.  Will watch your condition.  Will get help right away if you are not doing well or get worse.   This information is not intended to replace advice given to you by your health care provider. Make sure you discuss any questions you have with your health care provider.     Document Released: 04/06/2001 Document Revised: 02/25/2015 Document Reviewed:  01/16/2014 Elsevier Interactive Patient Education [employer].

## 2015-09-20 NOTE — ED Notes (Signed)
Pt returned from xray

## 2015-09-20 NOTE — ED Notes (Signed)
Pt reports her skin hurts, her hair hurts. She is having diarrhea, generalized abd pain for a couple of days. Coughing up mucus, nasal congestion and thinks she has an abscessed tooth. Plus she has her period which is making everything worse.

## 2015-09-20 NOTE — ED Notes (Signed)
Pt off unit with Xray 

## 2015-09-20 NOTE — ED Provider Notes (Signed)
CSN: [id number]     Arrival date & time 09/20/15  1221 History   First MD Initiated Contact with Patient 09/20/15 1232     Chief Complaint  Patient presents with  . Nasal Congestion  . Generalized Body Aches  . Abdominal Pain     (Consider location/radiation/quality/duration/timing/severity/associated sxs/prior Treatment) HPI   Pt is a 34 year old female, current smoker with 18+ packyear hx, she presents to the ER with multiple complaints including 3 days of nasal congestion, productive cough with yellow to brown sputum, hot and cold sweats, sore throat, body aches, diarrhea x4 today reportedly loose and dark brown.  Her most severe complaint is cough with chest tightness, sore chest wall, wheeze and SOB.  She denies fever, vomiting, abdominal pain, urinary sx, weakness, fatigue, hematochezia, melena, hematemesis, LE edema, palpitations, rash, pallor, facial pain, near-syncope.  Past Medical History  Diagnosis Date  . HPV in female   . Migraines   . HPV in female   . Abnormal Pap smear   . Termination of pregnancy     x 1  . PTSD (post-traumatic stress disorder)     No meds  . Borderline personality disorder     No meds  . Panic anxiety syndrome     no meds  . Heartburn in pregnancy   . Scoliosis of lumbar spine    Past Surgical History  Procedure Laterality Date  . Cesarean section      x 2  . Colposcopy    . Wisdom tooth extraction    . Dilation and curettage of uterus    . Cesarean section  07/21/2012    Procedure: CESAREAN SECTION;  Surgeon: Frederico Hamman, MD;  Location: [facility];  Service: Obstetrics;  Laterality: N/A;  Repeat Cesarean Section Delivery Boy @ [phone], Apgars 9/9  . Cesarean section     Family History  Problem Relation Age of Onset  . Cancer Mother   . Heart disease Mother   . Anesthesia problems Neg Hx    Social History  Substance Use Topics  . Smoking status: Current Every Day Smoker -- 0.50 packs/day for 13 years    Types: Cigarettes  .  Smokeless tobacco: None  . Alcohol Use: No   OB History    Gravida Para Term Preterm AB TAB SAB Ectopic Multiple Living   5 3 2 1 1 1  0 0 0 3     Review of Systems  Constitutional: Positive for chills and diaphoresis. Negative for fever, activity change, appetite change and fatigue.  HENT: Positive for congestion, postnasal drip and sore throat. Negative for ear pain, facial swelling, sinus pressure, sneezing, tinnitus, trouble swallowing and voice change.   Eyes: Negative.   Respiratory: Positive for cough, chest tightness, shortness of breath and wheezing. Negative for apnea, choking and stridor.   Gastrointestinal: Positive for diarrhea. Negative for nausea, vomiting, constipation, blood in stool, abdominal distention, anal bleeding and rectal pain.  Genitourinary: Positive for vaginal bleeding. Negative for dysuria, urgency, frequency, hematuria, flank pain, decreased urine volume, vaginal discharge, difficulty urinating, vaginal pain, menstrual problem and pelvic pain.  Musculoskeletal: Positive for myalgias and back pain. Negative for joint swelling, arthralgias, gait problem, neck pain and neck stiffness.  Neurological: Negative.   Psychiatric/Behavioral: Negative.       Allergies  Poison oak extract; Banana; Latex; Poison ivy extract; and Tomato  Home Medications   Prior to Admission medications   Medication Sig Start Date End Date Taking? Authorizing Provider  gabapentin (  NEURONTIN) 100 MG capsule Take 1 capsule (100 mg total) by mouth 3 (three) times daily. 11/04/14   Niel Hummer, NP  gabapentin (NEURONTIN) 300 MG capsule Take 1 capsule (300 mg total) by mouth at bedtime. 11/04/14   Niel Hummer, NP  naproxen (NAPROSYN) 500 MG tablet Take 1 tablet (500 mg total) by mouth once. 12/23/14   Comer Locket, PA-C  prazosin (MINIPRESS) 2 MG capsule Take 1 capsule (2 mg total) by mouth at bedtime. 11/04/14   Niel Hummer, NP  traMADol (ULTRAM) 50 MG tablet Take 1 tablet (50 mg  total) by mouth every 6 (six) hours as needed. 11/04/14   Niel Hummer, NP  traZODone (DESYREL) 50 MG tablet Take 1 tablet (50 mg total) by mouth at bedtime as needed and may repeat dose one time if needed for sleep. 11/04/14   Niel Hummer, NP  venlafaxine XR (EFFEXOR-XR) 75 MG 24 hr capsule Take 1 capsule (75 mg total) by mouth daily with breakfast. 11/04/14   Niel Hummer, NP   BP 120/70 mmHg  Pulse 95  Temp(Src) 97.6 F (36.4 C) (Oral)  Resp 18  SpO2 100%  LMP 09/20/2015 Physical Exam  Constitutional: She is oriented to person, place, and time. She appears well-developed and well-nourished. No distress.  HENT:  Head: Normocephalic and atraumatic.  Right Ear: External ear normal.  Left Ear: External ear normal.  Nose: Nose normal.  Mouth/Throat: Oropharynx is clear and moist. No oropharyngeal exudate.  Oropharyngeal erythema with multiple ulcers on soft palate, uvula midline, no tonsillar exudate  Eyes: Conjunctivae and EOM are normal. Pupils are equal, round, and reactive to light. Right eye exhibits no discharge. Left eye exhibits no discharge. No scleral icterus.  Neck: Normal range of motion. Neck supple. No JVD present. No tracheal deviation present. No thyromegaly present.  Cardiovascular: Normal rate, regular rhythm, normal heart sounds and intact distal pulses.  Exam reveals no gallop and no friction rub.   No murmur heard. Pulmonary/Chest: Effort normal and breath sounds normal. No respiratory distress. She has no wheezes. She has no rales. She exhibits tenderness.  Patient able to speak in full and complete sentences, no tachypnea him a frequent cough, breath sounds throughout without wheezes, rhonchi or rales, anterior chest wall minimally tender to palpation  Abdominal: Soft. Bowel sounds are normal. She exhibits no distension and no mass. There is no tenderness. There is no rebound and no guarding.  Abdomen soft, nondistended, normal bowel sounds 4, no tenderness rebound  or guarding, No CVA tenderness  Musculoskeletal: Normal range of motion. She exhibits no edema or tenderness.  Lymphadenopathy:    She has no cervical adenopathy.  Neurological: She is alert and oriented to person, place, and time. She has normal reflexes. No cranial nerve deficit. She exhibits normal muscle tone. Coordination normal.  Skin: Skin is warm and dry. No rash noted. She is not diaphoretic. No erythema. No pallor.  Psychiatric: She has a normal mood and affect. Her behavior is normal. Judgment and thought content normal.  Nursing note and vitals reviewed.   ED Course  Procedures (including critical care time) Labs Review Labs Reviewed  LIPASE, BLOOD  COMPREHENSIVE METABOLIC PANEL  CBC  URINALYSIS, ROUTINE W REFLEX MICROSCOPIC (NOT AT [facility])  I-STAT BETA HCG BLOOD, ED ([facility], [facility], [facility] ONLY)    Imaging Review No results found. I have personally reviewed and evaluated these images and lab results as part of my medical decision-making.   EKG Interpretation None  MDM   Final diagnoses:  None    Patient with multiple complaints including 3 days of nasal congestion and cough which has progressed to generalized body aches with crampy abdominal pain when having loose stools  Abdominal exam is benign, she has no nausea or vomiting, denies vaginal and urinary symptoms. Do not believe imaging is indicated as it is likely a viral syndrome.  DRE and Hemoccult will be obtained patient also complains of severe GERD which preceded her cold and cough symptoms. She endorses dark brown stools but denies hematochezia or melena, with negative Hemoccult will treat with a PPI trial and have her follow-up with her PCP.  Otherwise not concerned for acute pathology such as perforation, obstruction, acute appendicitis, cholecystitis.  Patient's nasal congestion is consistent with a viral URI. She had complaint of sore throat, negative rapid strep, likely also viral pharyngitis.  She has a  significant smoking history and remote childhood asthma history.  She complains of productive sputum, rib pain, wheeze. On exam she has no wheeze or rhonchi auscultated. She had some relief of her shortness of breath with multiple breathing treatments.  Chest x-ray was negative for pneumonia. She is PERC negative, no concern for PE.  She will be discharged home with treatment for acute bronchitis, she was encouraged to stop smoking. She was given an albuterol inhaler while in the ER and was discharged home with a steroid burst, cough syrup.   At time of discharge patient had stable vital signs, and was discharged home in suspect her condition. Filed Vitals:   09/20/15 1339 09/20/15 1400 09/20/15 1430 09/20/15 1523  BP: 100/72 126/52 133/53 121/55  Pulse: 79 106 103 95  Temp:      TempSrc:      Resp:    18  SpO2: 98% 98% 97% 93%   Medications  ipratropium-albuterol (DUONEB) 0.5-2.5 (3) MG/3ML nebulizer solution 3 mL (3 mLs Nebulization Given 09/20/15 1349)  albuterol (PROVENTIL HFA;VENTOLIN HFA) 108 (90 BASE) MCG/ACT inhaler 2 puff (not administered)  albuterol (PROVENTIL) (2.5 MG/3ML) 0.083% nebulizer solution 5 mg (5 mg Nebulization Given 09/20/15 1327)  ipratropium (ATROVENT) nebulizer solution 0.5 mg (0.5 mg Nebulization Given 09/20/15 1327)  benzonatate (TESSALON) capsule 200 mg (200 mg Oral Given 09/20/15 1325)  predniSONE (DELTASONE) tablet 60 mg (60 mg Oral Given 09/20/15 1356)      Delsa Grana, PA-C 09/21/15 2115  Carmin Muskrat, MD 09/27/15 [phone]

## 2015-09-22 LAB — CULTURE, GROUP A STREP: Strep A Culture: NEGATIVE

## 2015-10-09 ENCOUNTER — Emergency Department (HOSPITAL_COMMUNITY)
Admission: EM | Admit: 2015-10-09 | Discharge: 2015-10-09 | Disposition: A | Discharge status: Home / Self Care | Payer: Medicaid Other | Attending: Emergency Medicine | Admitting: Emergency Medicine

## 2015-10-09 ENCOUNTER — Emergency Department (HOSPITAL_COMMUNITY): Payer: Medicaid Other

## 2015-10-09 ENCOUNTER — Encounter (HOSPITAL_COMMUNITY): Payer: Self-pay | Admitting: Neurology

## 2015-10-09 DIAGNOSIS — Z79899 Other long term (current) drug therapy: Secondary | ICD-10-CM | POA: Insufficient documentation

## 2015-10-09 DIAGNOSIS — Z7952 Long term (current) use of systemic steroids: Secondary | ICD-10-CM | POA: Insufficient documentation

## 2015-10-09 DIAGNOSIS — Z91040 Latex allergy status: Secondary | ICD-10-CM | POA: Insufficient documentation

## 2015-10-09 DIAGNOSIS — M79672 Pain in left foot: Secondary | ICD-10-CM | POA: Diagnosis present

## 2015-10-09 DIAGNOSIS — M4126 Other idiopathic scoliosis, lumbar region: Secondary | ICD-10-CM | POA: Insufficient documentation

## 2015-10-09 DIAGNOSIS — Z792 Long term (current) use of antibiotics: Secondary | ICD-10-CM | POA: Insufficient documentation

## 2015-10-09 DIAGNOSIS — G43909 Migraine, unspecified, not intractable, without status migrainosus: Secondary | ICD-10-CM | POA: Diagnosis not present

## 2015-10-09 DIAGNOSIS — F410 Panic disorder [episodic paroxysmal anxiety] without agoraphobia: Secondary | ICD-10-CM | POA: Diagnosis not present

## 2015-10-09 DIAGNOSIS — Z8619 Personal history of other infectious and parasitic diseases: Secondary | ICD-10-CM | POA: Diagnosis not present

## 2015-10-09 DIAGNOSIS — F17210 Nicotine dependence, cigarettes, uncomplicated: Secondary | ICD-10-CM | POA: Diagnosis not present

## 2015-10-09 MED ORDER — IBUPROFEN 400 MG PO TABS
400.0000 mg | ORAL_TABLET | Freq: Once | ORAL | Status: AC
  Administered 2015-10-09: 400 mg via ORAL
  Filled 2015-10-09: qty 1

## 2015-10-09 MED ORDER — HYDROCODONE-ACETAMINOPHEN 5-325 MG PO TABS: 1.0000 | ORAL_TABLET | Freq: Four times a day (QID) | ORAL | Status: DC | PRN

## 2015-10-09 MED ORDER — IBUPROFEN 600 MG PO TABS: 600.0000 mg | ORAL_TABLET | Freq: Four times a day (QID) | ORAL | Status: DC | PRN

## 2015-10-09 NOTE — ED Provider Notes (Signed)
Pt c/o burning pain to left foot for past few days. Constant. Worse w palpation and wt bearing. No fevers. Denies specific injury.   Tenderness left mid to distal foot diffusely. No erythema, sts or sign infection.   Motrin. Will image.   To FT for complete exam/tx.    Lajean Saver, MD 10/09/15 [phone]

## 2015-10-09 NOTE — ED Notes (Signed)
See PA assessment 

## 2015-10-09 NOTE — Discharge Instructions (Signed)
Plantar Fasciitis With Rehab The plantar fascia is a fibrous, ligament-like, soft-tissue structure that spans the bottom of the foot. Plantar fasciitis, also called heel spur syndrome, is a condition that causes pain in the foot due to inflammation of the tissue. SYMPTOMS   Pain and tenderness on the underneath side of the foot.  Pain that worsens with standing or walking. CAUSES  Plantar fasciitis is caused by irritation and injury to the plantar fascia on the underneath side of the foot. Common mechanisms of injury include:  Direct trauma to bottom of the foot.  Damage to a small nerve that runs under the foot where the main fascia attaches to the heel bone.  Stress placed on the plantar fascia due to bone spurs. RISK INCREASES WITH:   Activities that place stress on the plantar fascia (running, jumping, pivoting, or cutting).  Poor strength and flexibility.  Improperly fitted shoes.  Tight calf muscles.  Flat feet.  Failure to warm-up properly before activity.  Obesity. PREVENTION  Warm up and stretch properly before activity.  Allow for adequate recovery between workouts.  Maintain physical fitness:  Strength, flexibility, and endurance.  Cardiovascular fitness.  Maintain a health body weight.  Avoid stress on the plantar fascia.  Wear properly fitted shoes, including arch supports for individuals who have flat feet. PROGNOSIS  If treated properly, then the symptoms of plantar fasciitis usually resolve without surgery. However, occasionally surgery is necessary. RELATED COMPLICATIONS   Recurrent symptoms that may result in a chronic condition.  Problems of the lower back that are caused by compensating for the injury, such as limping.  Pain or weakness of the foot during push-off following surgery.  Chronic inflammation, scarring, and partial or complete fascia tear, occurring more often from repeated injections. TREATMENT  Treatment initially involves  the use of ice and medication to help reduce pain and inflammation. The use of strengthening and stretching exercises may help reduce pain with activity, especially stretches of the Achilles tendon. These exercises may be performed at home or with a therapist. Your caregiver may recommend that you use heel cups of arch supports to help reduce stress on the plantar fascia. Occasionally, corticosteroid injections are given to reduce inflammation. If symptoms persist for greater than 6 months despite non-surgical (conservative), then surgery may be recommended.  MEDICATION   If pain medication is necessary, then nonsteroidal anti-inflammatory medications, such as aspirin and ibuprofen, or other minor pain relievers, such as acetaminophen, are often recommended.  Do not take pain medication within 7 days before surgery.  Prescription pain relievers may be given if deemed necessary by your caregiver. Use only as directed and only as much as you need.  Corticosteroid injections may be given by your caregiver. These injections should be reserved for the most serious cases, because they may only be given a certain number of times. HEAT AND COLD  Cold treatment (icing) relieves pain and reduces inflammation. Cold treatment should be applied for 10 to 15 minutes every 2 to 3 hours for inflammation and pain and immediately after any activity that aggravates your symptoms. Use ice packs or massage the area with a piece of ice (ice massage).  Heat treatment may be used prior to performing the stretching and strengthening activities prescribed by your caregiver, physical therapist, or athletic trainer. Use a heat pack or soak the injury in warm water. SEEK IMMEDIATE MEDICAL CARE IF:  Treatment seems to offer no benefit, or the condition worsens.  Any medications produce adverse side effects.   EXERCISES RANGE OF MOTION (ROM) AND STRETCHING EXERCISES - Plantar Fasciitis (Heel Spur Syndrome) These exercises may  help you when beginning to rehabilitate your injury. Your symptoms may resolve with or without further involvement from your physician, physical therapist or athletic trainer. While completing these exercises, remember:   Restoring tissue flexibility helps normal motion to return to the joints. This allows healthier, less painful movement and activity.  An effective stretch should be held for at least 30 seconds.  A stretch should never be painful. You should only feel a gentle lengthening or release in the stretched tissue. RANGE OF MOTION - Toe Extension, Flexion  Sit with your right / left leg crossed over your opposite knee.  Grasp your toes and gently pull them back toward the top of your foot. You should feel a stretch on the bottom of your toes and/or foot.  Hold this stretch for __________ seconds.  Now, gently pull your toes toward the bottom of your foot. You should feel a stretch on the top of your toes and or foot.  Hold this stretch for __________ seconds. Repeat __________ times. Complete this stretch __________ times per day.  RANGE OF MOTION - Ankle Dorsiflexion, Active Assisted  Remove shoes and sit on a chair that is preferably not on a carpeted surface.  Place right / left foot under knee. Extend your opposite leg for support.  Keeping your heel down, slide your right / left foot back toward the chair until you feel a stretch at your ankle or calf. If you do not feel a stretch, slide your bottom forward to the edge of the chair, while still keeping your heel down.  Hold this stretch for __________ seconds. Repeat __________ times. Complete this stretch __________ times per day.  STRETCH - Gastroc, Standing  Place hands on wall.  Extend right / left leg, keeping the front knee somewhat bent.  Slightly point your toes inward on your back foot.  Keeping your right / left heel on the floor and your knee straight, shift your weight toward the wall, not allowing your  back to arch.  You should feel a gentle stretch in the right / left calf. Hold this position for __________ seconds. Repeat __________ times. Complete this stretch __________ times per day. STRETCH - Soleus, Standing  Place hands on wall.  Extend right / left leg, keeping the other knee somewhat bent.  Slightly point your toes inward on your back foot.  Keep your right / left heel on the floor, bend your back knee, and slightly shift your weight over the back leg so that you feel a gentle stretch deep in your back calf.  Hold this position for __________ seconds. Repeat __________ times. Complete this stretch __________ times per day. STRETCH - Gastrocsoleus, Standing  Note: This exercise can place a lot of stress on your foot and ankle. Please complete this exercise only if specifically instructed by your caregiver.   Place the ball of your right / left foot on a step, keeping your other foot firmly on the same step.  Hold on to the wall or a rail for balance.  Slowly lift your other foot, allowing your body weight to press your heel down over the edge of the step.  You should feel a stretch in your right / left calf.  Hold this position for __________ seconds.  Repeat this exercise with a slight bend in your right / left knee. Repeat __________ times. Complete this stretch __________ times   per day.  STRENGTHENING EXERCISES - Plantar Fasciitis (Heel Spur Syndrome)  These exercises may help you when beginning to rehabilitate your injury. They may resolve your symptoms with or without further involvement from your physician, physical therapist or athletic trainer. While completing these exercises, remember:   Muscles can gain both the endurance and the strength needed for everyday activities through controlled exercises.  Complete these exercises as instructed by your physician, physical therapist or athletic trainer. Progress the resistance and repetitions only as  guided. STRENGTH - Towel Curls  Sit in a chair positioned on a non-carpeted surface.  Place your foot on a towel, keeping your heel on the floor.  Pull the towel toward your heel by only curling your toes. Keep your heel on the floor.  If instructed by your physician, physical therapist or athletic trainer, add ____________________ at the end of the towel. Repeat __________ times. Complete this exercise __________ times per day. STRENGTH - Ankle Inversion  Secure one end of a rubber exercise band/tubing to a fixed object (table, pole). Loop the other end around your foot just before your toes.  Place your fists between your knees. This will focus your strengthening at your ankle.  Slowly, pull your big toe up and in, making sure the band/tubing is positioned to resist the entire motion.  Hold this position for __________ seconds.  Have your muscles resist the band/tubing as it slowly pulls your foot back to the starting position. Repeat __________ times. Complete this exercises __________ times per day.    This information is not intended to replace advice given to you by your health care provider. Make sure you discuss any questions you have with your health care provider.   Document Released: 10/11/2005 Document Revised: 02/25/2015 Document Reviewed: 01/23/2009 Elsevier Interactive Patient Education 2016 [employer] Tendinitis Tendinitis is swelling and inflammation of the tendons. Tendons are band-like tissues that connect muscle to bone. Tendinitis commonly occurs in the:   Shoulders (rotator cuff).  Heels (Achilles tendon).  Elbows (triceps tendon). CAUSES Tendinitis is usually caused by overusing the tendon, muscles, and joints involved. When the tissue surrounding a tendon (synovium) becomes inflamed, it is called tenosynovitis. Tendinitis commonly develops in people whose jobs require repetitive motions. SYMPTOMS  Pain.  Tenderness.  Mild  swelling. DIAGNOSIS Tendinitis is usually diagnosed by physical exam. Your health care provider may also order X-rays or other imaging tests. TREATMENT Your health care provider may recommend certain medicines or exercises for your treatment. HOME CARE INSTRUCTIONS   Use a sling or splint for as long as directed by your health care provider until the pain decreases.  Put ice on the injured area.  Put ice in a plastic bag.  Place a towel between your skin and the bag.  Leave the ice on for 15-20 minutes, 3-4 times a day, or as directed by your health care provider.  Avoid using the limb while the tendon is painful. Perform gentle range of motion exercises only as directed by your health care provider. Stop exercises if pain or discomfort increase, unless directed otherwise by your health care provider.  Only take over-the-counter or prescription medicines for pain, discomfort, or fever as directed by your health care provider. SEEK MEDICAL CARE IF:   Your pain and swelling increase.  You develop new, unexplained symptoms, especially increased numbness in the hands. MAKE SURE YOU:   Understand these instructions.  Will watch your condition.  Will get help right away if you are not doing well  or get worse.   This information is not intended to replace advice given to you by your health care provider. Make sure you discuss any questions you have with your health care provider.   Document Released: 10/08/2000 Document Revised: 11/01/2014 Document Reviewed: 12/28/2010 Elsevier Interactive Patient Education [employer].

## 2015-10-09 NOTE — ED Notes (Signed)
Pt reports left foot pain to left lateral foot and ankle. Denies injury, has been hurting for several days. Is painful with pressure, is constant burning.

## 2015-10-09 NOTE — ED Provider Notes (Signed)
CSN: [id number]     Arrival date & time 10/09/15  1526 History  By signing my name below, I, Eustaquio Maize, attest that this documentation has been prepared under the direction and in the presence of Montine Circle, PA-C. Electronically Signed: Eustaquio Maize, ED Scribe. 10/09/2015. 3:58 PM.   Chief Complaint  Patient presents with  . Foot Pain   The history is provided by the patient. No language interpreter was used.     HPI Comments: Suzanne Klein is a 34 y.o. female who presents to the Emergency Department complaining of gradual onset, constant, moderate, burning, lateral left foot pain x 3-4 days. Pt states that she put on a pair of shoes that was very tight prior to the pain beginning. No known injury to the foot. She also reports intermittent shooting pain up to her left knee posteriorly. The pain is exacerbated with bearing weight onto the foot. Denies weakness, numbness, tingling, or any other associated symptoms.    Past Medical History  Diagnosis Date  . HPV in female   . Migraines   . HPV in female   . Abnormal Pap smear   . Termination of pregnancy     x 1  . PTSD (post-traumatic stress disorder)     No meds  . Borderline personality disorder     No meds  . Panic anxiety syndrome     no meds  . Heartburn in pregnancy   . Scoliosis of lumbar spine    Past Surgical History  Procedure Laterality Date  . Cesarean section      x 2  . Colposcopy    . Wisdom tooth extraction    . Dilation and curettage of uterus    . Cesarean section  07/21/2012    Procedure: CESAREAN SECTION;  Surgeon: Frederico Hamman, MD;  Location: [facility];  Service: Obstetrics;  Laterality: N/A;  Repeat Cesarean Section Delivery Boy @ [phone], Apgars 9/9  . Cesarean section     Family History  Problem Relation Age of Onset  . Cancer Mother   . Heart disease Mother   . Anesthesia problems Neg Hx    Social History  Substance Use Topics  . Smoking status: Current Every Day Smoker -- 0.50  packs/day for 13 years    Types: Cigarettes  . Smokeless tobacco: None  . Alcohol Use: No   OB History    Gravida Para Term Preterm AB TAB SAB Ectopic Multiple Living   5 3 2 1 1 1  0 0 0 3     Review of Systems  Musculoskeletal: Positive for arthralgias (Left foot).  Neurological: Negative for weakness and numbness.   Allergies  Poison oak extract; Banana; Latex; Poison ivy extract; and Tomato  Home Medications   Prior to Admission medications   Medication Sig Start Date End Date Taking? Authorizing Provider  asenapine (SAPHRIS) 5 MG SUBL 24 hr tablet Place 5 mg under the tongue 2 (two) times daily.    Historical Provider, MD  azithromycin (ZITHROMAX Z-PAK) 250 MG tablet Take 1 tablet (250 mg total) by mouth daily. 500mg  PO day 1, then 250mg  PO days 205 09/20/15   Delsa Grana, PA-C  gabapentin (NEURONTIN) 600 MG tablet Take 600 mg by mouth 4 (four) times daily.    Historical Provider, MD  guaiFENesin-codeine 100-10 MG/5ML syrup Take 5 mLs by mouth 3 (three) times daily as needed for cough. 09/20/15   Delsa Grana, PA-C  pantoprazole (PROTONIX) 20 MG tablet Take 1 tablet (20  mg total) by mouth daily. 09/20/15   Delsa Grana, PA-C  prazosin (MINIPRESS) 2 MG capsule Take 1 capsule (2 mg total) by mouth at bedtime. 11/04/14   Niel Hummer, NP  predniSONE (DELTASONE) 20 MG tablet Take 2 tablets (40 mg total) by mouth daily. 09/20/15   Delsa Grana, PA-C   Triage Vitals: BP 127/94 mmHg  Pulse 95  Temp(Src) 98.1 F (36.7 C) (Oral)  Resp 16  SpO2 98%  LMP 10/02/2015   Physical Exam  Constitutional: She is oriented to person, place, and time. She appears well-developed and well-nourished. No distress.  HENT:  Head: Normocephalic and atraumatic.  Eyes: Conjunctivae and EOM are normal.  Neck: Neck supple. No tracheal deviation present.  Cardiovascular: Normal rate and regular rhythm.   Intact distal pulses Brisk capillary refill  Pulmonary/Chest: Effort normal. No respiratory  distress.  Musculoskeletal: Normal range of motion.  Left foot tender to palp along the lateral aspect No bony abnormality or deformity No obvious swelling ROM and strength is 5/5  Neurological: She is alert and oriented to person, place, and time.  Sensation intact  Skin: Skin is warm and dry.  No abscess, cellulitis, or infection  Psychiatric: She has a normal mood and affect. Her behavior is normal.  Nursing note and vitals reviewed.   ED Course  Procedures (including critical care time)  DIAGNOSTIC STUDIES: Oxygen Saturation is 98% on RA, normal by my interpretation.    COORDINATION OF CARE: 3:55 PM-Discussed treatment plan which includes DG L Foot with pt at bedside and pt agreed to plan.    Imaging Review Dg Foot Complete Left  10/09/2015  CLINICAL DATA:  LEFT foot pain for a few days after putting on a shoe, increased pain with weight bearing, pain laterally, limited range of motion, no additional injury EXAM: LEFT FOOT - COMPLETE 3+ VIEW COMPARISON:  12/23/2014 FINDINGS: Osseous mineralization normal. Joint spaces preserved. No fracture, dislocation, or bone destruction. IMPRESSION: No acute osseous abnormalities. Electronically Signed   By: Lavonia Dana M.D.   On: 10/09/2015 15:57   I have personally reviewed and evaluated these images as part of my medical decision-making.    MDM   Final diagnoses:  Foot pain, left   Left foot pain 2-3 days. Onset after wearing tight shoes. Possible tendinitis versus plantar fasciitis.   I personally performed the services described in this documentation, which was scribed in my presence. The recorded information has been reviewed and is accurate.        Montine Circle, PA-C 10/09/15 1656  Quintella Reichert, MD 10/09/15 Carollee Massed

## 2016-01-22 ENCOUNTER — Emergency Department (HOSPITAL_COMMUNITY)
Admission: EM | Admit: 2016-01-22 | Discharge: 2016-01-22 | Disposition: A | Discharge status: Home / Self Care | Payer: Medicaid Other | Attending: Emergency Medicine | Admitting: Emergency Medicine

## 2016-01-22 ENCOUNTER — Encounter (HOSPITAL_COMMUNITY): Payer: Self-pay

## 2016-01-22 DIAGNOSIS — F17210 Nicotine dependence, cigarettes, uncomplicated: Secondary | ICD-10-CM | POA: Diagnosis not present

## 2016-01-22 DIAGNOSIS — R2243 Localized swelling, mass and lump, lower limb, bilateral: Secondary | ICD-10-CM | POA: Diagnosis present

## 2016-01-22 NOTE — ED Notes (Signed)
Pt called again without response from lobby

## 2016-01-22 NOTE — ED Notes (Signed)
Pt called  No response from lobby  

## 2016-01-22 NOTE — ED Notes (Signed)
Per EMS- Patient was recently started on Zyprexa and has had bilateral feet swelling x 2-3 days that has gotten progressively worse.

## 2016-05-25 ENCOUNTER — Emergency Department (HOSPITAL_COMMUNITY)
Admission: EM | Admit: 2016-05-25 | Discharge: 2016-05-25 | Disposition: A | Discharge status: Home / Self Care | Payer: Medicaid Other | Attending: Emergency Medicine | Admitting: Emergency Medicine

## 2016-05-25 ENCOUNTER — Encounter (HOSPITAL_COMMUNITY): Payer: Self-pay | Admitting: *Deleted

## 2016-05-25 ENCOUNTER — Emergency Department (HOSPITAL_COMMUNITY): Payer: Medicaid Other

## 2016-05-25 DIAGNOSIS — F17210 Nicotine dependence, cigarettes, uncomplicated: Secondary | ICD-10-CM | POA: Insufficient documentation

## 2016-05-25 DIAGNOSIS — Z91040 Latex allergy status: Secondary | ICD-10-CM | POA: Diagnosis not present

## 2016-05-25 DIAGNOSIS — Z792 Long term (current) use of antibiotics: Secondary | ICD-10-CM | POA: Diagnosis not present

## 2016-05-25 DIAGNOSIS — Z79899 Other long term (current) drug therapy: Secondary | ICD-10-CM | POA: Insufficient documentation

## 2016-05-25 DIAGNOSIS — X501XXA Overexertion from prolonged static or awkward postures, initial encounter: Secondary | ICD-10-CM | POA: Insufficient documentation

## 2016-05-25 DIAGNOSIS — Y9289 Other specified places as the place of occurrence of the external cause: Secondary | ICD-10-CM | POA: Insufficient documentation

## 2016-05-25 DIAGNOSIS — M25561 Pain in right knee: Secondary | ICD-10-CM

## 2016-05-25 DIAGNOSIS — Y9389 Activity, other specified: Secondary | ICD-10-CM | POA: Insufficient documentation

## 2016-05-25 DIAGNOSIS — Y999 Unspecified external cause status: Secondary | ICD-10-CM | POA: Insufficient documentation

## 2016-05-25 MED ORDER — IBUPROFEN 400 MG PO TABS: 400.0000 mg | ORAL_TABLET | Freq: Four times a day (QID) | ORAL | 0 refills | Status: DC | PRN

## 2016-05-25 NOTE — Discharge Instructions (Signed)
Please follow-up with orthopedics for further evaluation and management. Please use ibuprofen or Tylenol as needed for pain.

## 2016-05-25 NOTE — ED Notes (Signed)
PT DISCHARGED. INSTRUCTIONS AND PRESCRIPTION GIVEN. AAOX4. PT IN NO APPARENT DISTRESS. THE OPPORTUNITY TO ASK QUESTIONS WAS PROVIDED. 

## 2016-05-25 NOTE — ED Triage Notes (Signed)
Per EMS, from home, reports she was standing yesterday and heard her R knee "pop."  Denies any injury at this time.  Non-weight bearing.  Mild swelling to R lateral knee noted per EMS.

## 2016-05-25 NOTE — ED Provider Notes (Signed)
[facility] DEPT Provider Note   CSN: [id number] Arrival date & time: 05/25/16  1009  First Provider Contact:  None       History   Chief Complaint Chief Complaint  Patient presents with  . Knee Pain    HPI Suzanne Klein is a 35 y.o. female.  HPI   35 year old female presents today with complaints of right knee pain. Patient reports yesterday she was getting off the toilet when she felt a pop in her right knee. She reports immediate pain, but was able to ambulate without difficulty. She reports today developing of posterior knee pain, pain with ambulation. She denies any swelling or edema, redness, decreased range of motion. No history of the same. No medications prior to arrival.     Past Medical History:  Diagnosis Date  . Abnormal Pap smear   . Borderline personality disorder    No meds  . Heartburn in pregnancy   . HPV in female   . HPV in female   . Migraines   . Panic anxiety syndrome    no meds  . PTSD (post-traumatic stress disorder)    No meds  . Scoliosis of lumbar spine   . Termination of pregnancy    x 1    Patient Active Problem List   Diagnosis Date Noted  . Major depressive disorder, recurrent, severe without psychotic features ([place])   . Posttraumatic stress disorder with dissociative symptoms 11/01/2014  . MDD (major depressive disorder), recurrent episode, severe ([place]) 10/31/2014  . Pharyngitis 08/21/2013  . Throat pain 08/21/2013  . Rash 08/21/2013  . Cervical insufficiency in pregnancy, antepartum 07/06/2013  . Abnormal quad screen 07/06/2013  . PTSD (post-traumatic stress disorder) 06/29/2013  . Anxiety 06/29/2013  . Borderline personality disorder 06/29/2013  . Supervision of other normal pregnancy 05/21/2013  . Previous cesarean section 05/21/2013  . Tobacco smoking complicating pregnancy [id number]    Past Surgical History:  Procedure Laterality Date  . CESAREAN SECTION     x 2  . CESAREAN SECTION  07/21/2012   Procedure: CESAREAN SECTION;  Surgeon: Frederico Hamman, MD;  Location: [facility];  Service: Obstetrics;  Laterality: N/A;  Repeat Cesarean Section Delivery Boy @ [phone], Apgars 9/9  . CESAREAN SECTION    . COLPOSCOPY    . DILATION AND CURETTAGE OF UTERUS    . WISDOM TOOTH EXTRACTION      OB History    Gravida Para Term Preterm AB Living   5 3 2 1 1 3    SAB TAB Ectopic Multiple Live Births   0 1 0 0         Home Medications    Prior to Admission medications   Medication Sig Start Date End Date Taking? Authorizing Provider  azithromycin (ZITHROMAX Z-PAK) 250 MG tablet Take 1 tablet (250 mg total) by mouth daily. 500mg  PO day 1, then 250mg  PO days 205 Patient not taking: Reported on 01/22/2016 09/20/15   Delsa Grana, PA-C  gabapentin (NEURONTIN) 600 MG tablet Take 600 mg by mouth 4 (four) times daily.    Historical Provider, MD  guaiFENesin-codeine 100-10 MG/5ML syrup Take 5 mLs by mouth 3 (three) times daily as needed for cough. Patient not taking: Reported on 01/22/2016 09/20/15   Delsa Grana, PA-C  HYDROcodone-acetaminophen (NORCO/VICODIN) 5-325 MG tablet Take 1-2 tablets by mouth every 6 (six) hours as needed. Patient not taking: Reported on 01/22/2016 10/09/15   Montine Circle, PA-C  ibuprofen (ADVIL,MOTRIN) 400 MG tablet Take 1 tablet (400 mg total)  by mouth every 6 (six) hours as needed. 05/25/16   Dellis Filbert Adaliah Hiegel, PA-C  OLANZapine (ZYPREXA) 5 MG tablet Take 5 mg by mouth at bedtime.    Historical Provider, MD  pantoprazole (PROTONIX) 20 MG tablet Take 1 tablet (20 mg total) by mouth daily. Patient not taking: Reported on 01/22/2016 09/20/15   Delsa Grana, PA-C  prazosin (MINIPRESS) 2 MG capsule Take 1 capsule (2 mg total) by mouth at bedtime. 11/04/14   Niel Hummer, NP  predniSONE (DELTASONE) 20 MG tablet Take 2 tablets (40 mg total) by mouth daily. Patient not taking: Reported on 01/22/2016 09/20/15   Delsa Grana, PA-C    Family History Family History  Problem Relation Age of  Onset  . Cancer Mother   . Heart disease Mother   . Anesthesia problems Neg Hx     Social History Social History  Substance Use Topics  . Smoking status: Current Every Day Smoker    Packs/day: 0.15    Years: 13.00    Types: Cigarettes  . Smokeless tobacco: Never Used  . Alcohol use Yes     Allergies   Poison oak extract [extract of poison oak]; Banana; Latex; Poison ivy extract [extract of poison ivy]; and Tomato   Review of Systems Review of Systems  All other systems reviewed and are negative.    Physical Exam Updated Vital Signs BP 120/72 (BP Location: Right Arm)   Pulse 94   Temp 98.5 F (36.9 C) (Oral)   Resp 18   LMP 05/24/2016   SpO2 96%   Physical Exam  Constitutional: She is oriented to person, place, and time. She appears well-developed and well-nourished.  HENT:  Head: Normocephalic and atraumatic.  Eyes: Conjunctivae are normal. Pupils are equal, round, and reactive to light. Right eye exhibits no discharge. Left eye exhibits no discharge. No scleral icterus.  Neck: Normal range of motion. No JVD present. No tracheal deviation present.  Pulmonary/Chest: Effort normal. No stridor.  Musculoskeletal:  External exam shows no asymmetry of the knees, no swelling or edema. Tenderness to palpation of the right anterior joint line, no significant anterior posterior tibial translation, no valgus or varus laxity distal sensation intact  Neurological: She is alert and oriented to person, place, and time. Coordination normal.  Psychiatric: She has a normal mood and affect. Her behavior is normal. Judgment and thought content normal.  Nursing note and vitals reviewed.    ED Treatments / Results  Labs (all labs ordered are listed, but only abnormal results are displayed) Labs Reviewed - No data to display  EKG  EKG Interpretation None       Radiology Dg Knee Complete 4 Views Right  Result Date: 05/25/2016 CLINICAL DATA:  Acute onset pain after standing  suddenly EXAM: RIGHT KNEE - COMPLETE 4+ VIEW COMPARISON:  None. FINDINGS: Frontal, lateral, and bilateral oblique views were obtained. There is no fracture or dislocation. No joint effusion. Joint spaces appear unremarkable. No erosive change. IMPRESSION: No demonstrable fracture or joint effusion. No apparent arthropathic change. Electronically Signed   By: Lowella Grip III M.D.   On: 05/25/2016 10:56    Procedures Procedures (including critical care time)  Medications Ordered in ED Medications - No data to display   Initial Impression / Assessment and Plan / ED Course  I have reviewed the triage vital signs and the nursing notes.  Pertinent labs & imaging results that were available during my care of the patient were reviewed by me and considered in my  medical decision making (see chart for details).  Clinical Course     Final Clinical Impressions(s) / ED Diagnoses   Final diagnoses:  Right knee pain     Labs:  Imaging: dg knee complete  Consults:  Therapeutics:  Discharge Meds:    Assessment/Plan:  Patient presents with likely knee sprain, no significant swelling or edema, no signs of infectious etiology. Patient ambulates without difficulty. She will need orthopedics follow-up, ibuprofen, rest, ice. Patient was understanding and agreement to today's plan had no further questions or concerns for discharge    New Prescriptions New Prescriptions   IBUPROFEN (ADVIL,MOTRIN) 400 MG TABLET    Take 1 tablet (400 mg total) by mouth every 6 (six) hours as needed.     Okey Regal, PA-C 05/25/16 [place], MD 05/25/16 1159

## 2016-05-25 NOTE — ED Notes (Addendum)
Pt reports pain behind and around the sides of her R knee.  She reports she was using the BR, when she stood up, she heard a "pop" and fell, landing on her L knee.  Pt reports shooting pain to her R knee when flexing her R foot.  Denies use of BCP, but is a smoker.  She also denies traveling or recent surgery

## 2016-07-09 LAB — GLUCOSE, POCT (MANUAL RESULT ENTRY): POC GLUCOSE: 104 mg/dL — AB (ref 70–99)

## 2016-07-12 ENCOUNTER — Ambulatory Visit: Payer: Self-pay | Admitting: Internal Medicine

## 2016-07-28 DIAGNOSIS — Z8659 Personal history of other mental and behavioral disorders: Secondary | ICD-10-CM

## 2016-07-28 NOTE — Congregational Nurse Program (Signed)
Congregational Nurse Program Note  Date of Encounter: 07/28/2016  Past Medical History: Past Medical History:  Diagnosis Date  . Abnormal Pap smear   . Borderline personality disorder    No meds  . Heartburn in pregnancy   . HPV in female   . HPV in female   . Migraines   . Panic anxiety syndrome    no meds  . PTSD (post-traumatic stress disorder)    No meds  . Scoliosis of lumbar spine   . Termination of pregnancy    x 1    Encounter Details:     CNP Questionnaire - 07/28/16 1134      Patient Demographics   Is this a new or existing patient? New   Patient is considered a/an Not Applicable   Race Caucasian/White     Patient Assistance   Location of Patient Assistance Not Applicable   Patient's financial/insurance status Low Income;Medicaid   Uninsured Patient Yes   Interventions Not Applicable   Patient referred to apply for the following financial assistance Not Applicable   Food insecurities addressed Not Applicable   Transportation assistance No   Assistance securing medications No   Educational health offerings Safety;Spiritual care     Encounter Details   Primary purpose of visit Chronic Illness/Condition Visit;Spiritual Care/Support Visit;Safety   Was an Emergency Department visit averted? Not Applicable   Does patient have a medical provider? Yes   Patient referred to Not Applicable   Was a mental health screening completed? (GAINS tool) No   Does patient have dental issues? No   Does patient have vision issues? No   Does your patient have an abnormal blood pressure today? No   Since previous encounter, have you referred patient for abnormal blood pressure that resulted in a new diagnosis or medication change? No   Does your patient have an abnormal blood glucose today? No   Since previous encounter, have you referred patient for abnormal blood glucose that resulted in a new diagnosis or medication change? No   Was there a life-saving intervention made?  No     Clinic visit for support.  States is very angry and "wants to hurt someone".  States would not follow through but that is how angry she feels.  She was recently informed that her children who have been in foster care have been adopted out.  Client has multiple resources through [facility] [place] and [facility].  Encouraged her to keep appointments.  Today provided support and discussed personal safety.  Instructed client to RTC in-between appointments for support

## 2016-11-03 ENCOUNTER — Encounter (HOSPITAL_COMMUNITY): Payer: Self-pay | Admitting: Emergency Medicine

## 2016-11-03 ENCOUNTER — Ambulatory Visit (HOSPITAL_COMMUNITY)
Admission: RE | Admit: 2016-11-03 | Discharge: 2016-11-03 | Disposition: A | Discharge status: Home / Self Care | Payer: Medicaid Other | Source: Home / Self Care | Attending: Psychiatry | Admitting: Psychiatry

## 2016-11-03 ENCOUNTER — Emergency Department (HOSPITAL_COMMUNITY)
Admission: EM | Admit: 2016-11-03 | Discharge: 2016-11-05 | Disposition: A | Discharge status: Other Acute Inpatient Hospital | Payer: Medicaid Other | Attending: Emergency Medicine | Admitting: Emergency Medicine

## 2016-11-03 DIAGNOSIS — R45851 Suicidal ideations: Secondary | ICD-10-CM | POA: Insufficient documentation

## 2016-11-03 DIAGNOSIS — F332 Major depressive disorder, recurrent severe without psychotic features: Secondary | ICD-10-CM | POA: Diagnosis not present

## 2016-11-03 DIAGNOSIS — Z91040 Latex allergy status: Secondary | ICD-10-CM | POA: Diagnosis not present

## 2016-11-03 DIAGNOSIS — Z79899 Other long term (current) drug therapy: Secondary | ICD-10-CM | POA: Insufficient documentation

## 2016-11-03 DIAGNOSIS — F17210 Nicotine dependence, cigarettes, uncomplicated: Secondary | ICD-10-CM | POA: Insufficient documentation

## 2016-11-03 DIAGNOSIS — F259 Schizoaffective disorder, unspecified: Secondary | ICD-10-CM | POA: Diagnosis present

## 2016-11-03 DIAGNOSIS — F4310 Post-traumatic stress disorder, unspecified: Secondary | ICD-10-CM | POA: Diagnosis present

## 2016-11-03 LAB — COMPREHENSIVE METABOLIC PANEL
ALT: 17 U/L (ref 14–54)
AST: 19 U/L (ref 15–41)
Albumin: 4.2 g/dL (ref 3.5–5.0)
Alkaline Phosphatase: 77 U/L (ref 38–126)
Anion gap: 8 (ref 5–15)
BUN: 16 mg/dL (ref 6–20)
CO2: 28 mmol/L (ref 22–32)
Calcium: 9 mg/dL (ref 8.9–10.3)
Chloride: 105 mmol/L (ref 101–111)
Creatinine, Ser: 0.74 mg/dL (ref 0.44–1.00)
GFR calc Af Amer: >60 mL/min (ref >60)
GFR calc non Af Amer: >60 mL/min (ref >60)
Glucose, Bld: 119 mg/dL — ABNORMAL HIGH (ref 65–99)
Potassium: 3.5 mmol/L (ref 3.5–5.1)
Sodium: 141 mmol/L (ref 135–145)
Total Bilirubin: 0.5 mg/dL (ref 0.3–1.2)
Total Protein: 7.6 g/dL (ref 6.5–8.1)

## 2016-11-03 LAB — CBC
HCT: 41.7 % (ref 36.0–46.0)
Hemoglobin: 14.2 g/dL (ref 12.0–15.0)
MCH: 31.1 pg (ref 26.0–34.0)
MCHC: 34.1 g/dL (ref 30.0–36.0)
MCV: 91.4 fL (ref 78.0–100.0)
Platelets: 232 10*3/uL (ref 150–400)
RBC: 4.56 MIL/uL (ref 3.87–5.11)
RDW: 13.8 % (ref 11.5–15.5)
WBC: 7.9 10*3/uL (ref 4.0–10.5)

## 2016-11-03 LAB — RAPID URINE DRUG SCREEN, HOSP PERFORMED
AMPHETAMINES: NOT DETECTED
BENZODIAZEPINES: NOT DETECTED
Barbiturates: NOT DETECTED
COCAINE: NOT DETECTED
OPIATES: NOT DETECTED
TETRAHYDROCANNABINOL: POSITIVE — AB

## 2016-11-03 LAB — ETHANOL

## 2016-11-03 LAB — SALICYLATE LEVEL: Salicylate Lvl: <7 mg/dL (ref 2.8–30.0)

## 2016-11-03 LAB — PREGNANCY, URINE: Preg Test, Ur: NEGATIVE

## 2016-11-03 LAB — ACETAMINOPHEN LEVEL: Acetaminophen (Tylenol), Serum: <10 ug/mL — ABNORMAL LOW (ref 10–30)

## 2016-11-03 MED ORDER — DULOXETINE HCL 30 MG PO CPEP
30.0000 mg | ORAL_CAPSULE | Freq: Every day | ORAL | Status: DC
  Administered 2016-11-04: 30 mg via ORAL
  Filled 2016-11-03 (×2): qty 1

## 2016-11-03 MED ORDER — PRAZOSIN HCL 2 MG PO CAPS
2.0000 mg | ORAL_CAPSULE | Freq: Every day | ORAL | Status: DC
  Filled 2016-11-03 (×2): qty 1

## 2016-11-03 MED ORDER — OLANZAPINE 5 MG PO TABS
5.0000 mg | ORAL_TABLET | Freq: Every day | ORAL | Status: DC
  Administered 2016-11-04: 5 mg via ORAL
  Filled 2016-11-03: qty 1

## 2016-11-03 MED ORDER — GABAPENTIN 300 MG PO CAPS
600.0000 mg | ORAL_CAPSULE | Freq: Four times a day (QID) | ORAL | Status: DC
  Administered 2016-11-04 (×2): 600 mg via ORAL
  Filled 2016-11-03 (×2): qty 2

## 2016-11-03 MED ORDER — MIRTAZAPINE 7.5 MG PO TABS
7.5000 mg | ORAL_TABLET | Freq: Every day | ORAL | Status: DC
  Administered 2016-11-04: 7.5 mg via ORAL
  Filled 2016-11-03: qty 2

## 2016-11-03 NOTE — ED Notes (Signed)
Provider notified of pt last BP of 90/65, VO to hold Minipress.

## 2016-11-03 NOTE — H&P (Signed)
Behavioral Health Medical Screening Exam  Suzanne Klein is an 36 y.o. female.  Total Time spent with patient: 20 minutes  Psychiatric Specialty Exam: Physical Exam  Nursing note and vitals reviewed. Psychiatric: Her speech is normal and behavior is normal. Judgment normal. Her mood appears anxious. Her affect is labile. Thought content is paranoid and delusional. Cognition and memory are normal.    Review of Systems  Psychiatric/Behavioral: Positive for hallucinations.  All other systems reviewed and are negative.   There were no vitals taken for this visit.There is no height or weight on file to calculate BMI.  General Appearance: Disheveled  Eye Contact:  Fair  Speech:  Blocked  Volume:  Normal  Mood:  Anxious  Affect:  Constricted  Thought Process:  Disorganized patient was talking to self, "keep quiet."   Preoccupied.    Orientation:  Full (Time, Place, and Person)  Thought Content:  Hallucinations: Auditory and Rumination  Suicidal Thoughts:  No  Homicidal Thoughts:  No  Memory:  Immediate;   Poor Recent;   Poor Remote;   Poor  Judgement:  Poor  Insight:  Lacking  Psychomotor Activity:  Normal  Concentration: Concentration: Fair and Attention Span: Fair  Recall:  Poor  Fund of Knowledge:Poor  Language: Poor  Akathisia:  Negative  Handed:  Right  AIMS (if indicated):     Assets:  Communication Skills  Sleep:  poor   Musculoskeletal: Strength & Muscle Tone: within normal limits Gait & Station: unsteady Patient leans: using cane  There were no vitals taken for this visit.  Recommendations:  Based on my evaluation the patient appears to have an emergency medical condition for which I recommend the patient be transferred to the emergency department for further evaluation.  Janett Labella, NP [facility] 11/03/2016, 4:22 PM   Agree with NP assessment as above

## 2016-11-03 NOTE — ED Triage Notes (Signed)
Patient arrived from [facility].  Patient has thoughts of harming herself. Denies any plan

## 2016-11-03 NOTE — ED Notes (Signed)
Meal tray given 

## 2016-11-03 NOTE — ED Notes (Signed)
Patient ambulated out of her room without the assistance of her cane to view a disruption involving another patient.
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
## 2016-11-03 NOTE — ED Provider Notes (Signed)
[place] DEPT Provider Note   CSN: [id number] Arrival date & time: 11/03/16  1620     History   Chief Complaint Chief Complaint  Patient presents with  . Suicidal    HPI Suzanne Klein is a 36 y.o. female.  36 yo F with a chief complaint of 2 many stressors in her life. She had could have behavioral health today because she is having trouble coping. When she got there they said she needed to come to the [facility] department to become medically cleared. Patient denies chest pain and cough denies fever denies abdominal pain.    The history is provided by the patient.  Illness  This is a new problem. The current episode started less than 1 hour ago. The problem occurs constantly. The problem has not changed since onset.Pertinent negatives include no chest pain, no headaches and no shortness of breath. Nothing aggravates the symptoms. Nothing relieves the symptoms. She has tried nothing for the symptoms. The treatment provided no relief.    Past Medical History:  Diagnosis Date  . Abnormal Pap smear   . Borderline personality disorder    No meds  . Heartburn in pregnancy   . HPV in female   . HPV in female   . Migraines   . Panic anxiety syndrome    no meds  . PTSD (post-traumatic stress disorder)    No meds  . Scoliosis of lumbar spine   . Termination of pregnancy    x 1    Patient Active Problem List   Diagnosis Date Noted  . Major depressive disorder, recurrent, severe without psychotic features ([place])   . Posttraumatic stress disorder with dissociative symptoms 11/01/2014  . MDD (major depressive disorder), recurrent episode, severe ([place]) 10/31/2014  . Pharyngitis 08/21/2013  . Throat pain 08/21/2013  . Rash 08/21/2013  . Cervical insufficiency in pregnancy, antepartum 07/06/2013  . Abnormal quad screen 07/06/2013  . PTSD (post-traumatic stress disorder) 06/29/2013  . Anxiety 06/29/2013  . Borderline personality disorder 06/29/2013  . Supervision of  other normal pregnancy 05/21/2013  . Previous cesarean section 05/21/2013  . Tobacco smoking complicating pregnancy [id number]    Past Surgical History:  Procedure Laterality Date  . CESAREAN SECTION     x 2  . CESAREAN SECTION  07/21/2012   Procedure: CESAREAN SECTION;  Surgeon: Frederico Hamman, MD;  Location: [facility];  Service: Obstetrics;  Laterality: N/A;  Repeat Cesarean Section Delivery Boy @ [phone], Apgars 9/9  . CESAREAN SECTION    . COLPOSCOPY    . DILATION AND CURETTAGE OF UTERUS    . WISDOM TOOTH EXTRACTION      OB History    Gravida Para Term Preterm AB Living   5 3 2 1 1 3    SAB TAB Ectopic Multiple Live Births   0 1 0 0 2       Home Medications    Prior to Admission medications   Medication Sig Start Date End Date Taking? Authorizing Provider  DULoxetine (CYMBALTA) 30 MG capsule Take 30 mg by mouth daily.   Yes Historical Provider, MD  gabapentin (NEURONTIN) 600 MG tablet Take 600 mg by mouth 4 (four) times daily.   Yes Historical Provider, MD  mirtazapine (REMERON) 15 MG tablet Take 7.5-15 mg by mouth at bedtime.   Yes Historical Provider, MD  OLANZapine (ZYPREXA) 5 MG tablet Take 5 mg by mouth at bedtime.   Yes Historical Provider, MD  prazosin (MINIPRESS) 2 MG capsule Take 1 capsule (2  mg total) by mouth at bedtime. Patient taking differently: Take 5 mg by mouth at bedtime.  11/04/14  Yes Niel Hummer, NP  RaNITidine HCl (ZANTAC PO) Take 1-2 tablets by mouth daily as needed (heart burn).   Yes Historical Provider, MD  azithromycin (ZITHROMAX Z-PAK) 250 MG tablet Take 1 tablet (250 mg total) by mouth daily. 500mg  PO day 1, then 250mg  PO days 205 Patient not taking: Reported on 11/03/2016 09/20/15   Delsa Grana, PA-C  guaiFENesin-codeine 100-10 MG/5ML syrup Take 5 mLs by mouth 3 (three) times daily as needed for cough. Patient not taking: Reported on 11/03/2016 09/20/15   Delsa Grana, PA-C  HYDROcodone-acetaminophen (NORCO/VICODIN) 5-325 MG tablet Take 1-2 tablets  by mouth every 6 (six) hours as needed. Patient not taking: Reported on 11/03/2016 10/09/15   Montine Circle, PA-C  ibuprofen (ADVIL,MOTRIN) 400 MG tablet Take 1 tablet (400 mg total) by mouth every 6 (six) hours as needed. Patient not taking: Reported on 11/03/2016 05/25/16   Okey Regal, PA-C  pantoprazole (PROTONIX) 20 MG tablet Take 1 tablet (20 mg total) by mouth daily. Patient not taking: Reported on 11/03/2016 09/20/15   Delsa Grana, PA-C  predniSONE (DELTASONE) 20 MG tablet Take 2 tablets (40 mg total) by mouth daily. Patient not taking: Reported on 11/03/2016 09/20/15   Delsa Grana, PA-C    Family History Family History  Problem Relation Age of Onset  . Cancer Mother   . Heart disease Mother   . Anesthesia problems Neg Hx     Social History Social History  Substance Use Topics  . Smoking status: Current Every Day Smoker    Packs/day: 0.15    Years: 13.00    Types: Cigarettes  . Smokeless tobacco: Never Used  . Alcohol use Yes     Allergies   Poison oak extract [poison oak extract]; Banana; Latex; Poison ivy extract [poison ivy extract]; and Tomato   Review of Systems Review of Systems  Constitutional: Negative for chills and fever.  HENT: Negative for congestion and rhinorrhea.   Eyes: Negative for redness and visual disturbance.  Respiratory: Negative for shortness of breath and wheezing.   Cardiovascular: Negative for chest pain and palpitations.  Gastrointestinal: Negative for nausea and vomiting.  Genitourinary: Negative for dysuria and urgency.  Musculoskeletal: Negative for arthralgias and myalgias.  Skin: Negative for pallor and wound.  Neurological: Negative for dizziness and headaches.  Psychiatric/Behavioral: Positive for agitation and decreased concentration. The patient is nervous/anxious and is hyperactive.      Physical Exam Updated Vital Signs BP 90/65 (BP Location: Right Arm)   Pulse 82   Temp 98.2 F (36.8 C) (Oral)   Resp 18   Ht 5\' 6"   (1.676 m)   Wt 260 lb (117.9 kg)   LMP 11/03/2016   SpO2 98% Comment: Simultaneous filing. User may not have seen previous data.  BMI 41.97 kg/m   Physical Exam  Constitutional: She is oriented to person, place, and time. She appears well-developed and well-nourished. No distress.  HENT:  Head: Normocephalic and atraumatic.  Eyes: EOM are normal. Pupils are equal, round, and reactive to light.  Neck: Normal range of motion. Neck supple.  Cardiovascular: Normal rate and regular rhythm.  Exam reveals no gallop and no friction rub.   No murmur heard. Pulmonary/Chest: Effort normal. She has no wheezes. She has no rales.  Abdominal: Soft. She exhibits no distension. There is no tenderness.  Musculoskeletal: She exhibits no edema or tenderness.  Neurological: She is alert  and oriented to person, place, and time.  Skin: Skin is warm and dry. She is not diaphoretic.  Psychiatric: She has a normal mood and affect. Her behavior is normal.  Nursing note and vitals reviewed.    ED Treatments / Results  Labs (all labs ordered are listed, but only abnormal results are displayed) Labs Reviewed  COMPREHENSIVE METABOLIC PANEL - Abnormal; Notable for the following:       Result Value   Glucose, Bld 119 (*)    All other components within normal limits  ACETAMINOPHEN LEVEL - Abnormal; Notable for the following:    Acetaminophen (Tylenol), Serum <10 (*)    All other components within normal limits  RAPID URINE DRUG SCREEN, HOSP PERFORMED - Abnormal; Notable for the following:    Tetrahydrocannabinol POSITIVE (*)    All other components within normal limits  ETHANOL  SALICYLATE LEVEL  CBC  PREGNANCY, URINE    EKG  EKG Interpretation None       Radiology No results found.  Procedures Procedures (including critical care time)  Medications Ordered in ED Medications  DULoxetine (CYMBALTA) DR capsule 30 mg (not administered)  gabapentin (NEURONTIN) capsule 600 mg (600 mg Oral Given  11/04/16 0012)  mirtazapine (REMERON) tablet 7.5-15 mg (7.5 mg Oral Given 11/04/16 0010)  OLANZapine (ZYPREXA) tablet 5 mg (5 mg Oral Given 11/04/16 0012)  prazosin (MINIPRESS) capsule 2 mg (0 mg Oral Hold 11/03/16 2359)     Initial Impression / Assessment and Plan / ED Course  I have reviewed the triage vital signs and the nursing notes.  Pertinent labs & imaging results that were available during my care of the patient were reviewed by me and considered in my medical decision making (see chart for details).  Clinical Course     36 yo F With a chief complaint of many stressors in her life. Feel the patient is medically clear.  TTS eval.   The patients results and plan were reviewed and discussed.   Any x-rays performed were independently reviewed by myself.   Differential diagnosis were considered with the presenting HPI.  Medications  DULoxetine (CYMBALTA) DR capsule 30 mg (not administered)  gabapentin (NEURONTIN) capsule 600 mg (600 mg Oral Given 11/04/16 0012)  mirtazapine (REMERON) tablet 7.5-15 mg (7.5 mg Oral Given 11/04/16 0010)  OLANZapine (ZYPREXA) tablet 5 mg (5 mg Oral Given 11/04/16 0012)  prazosin (MINIPRESS) capsule 2 mg (0 mg Oral Hold 11/03/16 2359)    Vitals:   11/03/16 1646 11/03/16 1650 11/03/16 2300  BP:  115/69 90/65  Pulse:  93 82  Resp:  18 18  Temp:  98.2 F (36.8 C) 98.2 F (36.8 C)  TempSrc:  Oral Oral  SpO2:  97% 98%  Weight: 260 lb (117.9 kg)    Height: 5\' 6"  (1.676 m)      Final diagnoses:  Suicidal ideation        Final Clinical Impressions(s) / ED Diagnoses   Final diagnoses:  Suicidal ideation    New Prescriptions New Prescriptions   No medications on file     Deno Etienne, DO 11/04/16 0106

## 2016-11-04 DIAGNOSIS — F259 Schizoaffective disorder, unspecified: Secondary | ICD-10-CM | POA: Diagnosis present

## 2016-11-04 MED ORDER — DULOXETINE HCL 30 MG PO CPEP: 60.0000 mg | ORAL_CAPSULE | Freq: Every day | ORAL | Status: DC

## 2016-11-04 MED ORDER — TRAZODONE HCL 100 MG PO TABS: 100.0000 mg | ORAL_TABLET | Freq: Every day | ORAL | Status: DC

## 2016-11-04 MED ORDER — HYDROXYZINE HCL 25 MG PO TABS
50.0000 mg | ORAL_TABLET | Freq: Every day | ORAL | Status: DC
Start: 2016-11-04 — End: 2016-11-05
  Administered 2016-11-04: 50 mg via ORAL
  Filled 2016-11-04: qty 2

## 2016-11-04 MED ORDER — GABAPENTIN 300 MG PO CAPS
600.0000 mg | ORAL_CAPSULE | Freq: Three times a day (TID) | ORAL | Status: DC
  Administered 2016-11-04 (×2): 600 mg via ORAL
  Filled 2016-11-04 (×2): qty 2

## 2016-11-04 MED ORDER — HALOPERIDOL 2 MG PO TABS
2.0000 mg | ORAL_TABLET | Freq: Every day | ORAL | Status: DC
Start: 2016-11-04 — End: 2016-11-05
  Administered 2016-11-04: 2 mg via ORAL
  Filled 2016-11-04: qty 1

## 2016-11-04 NOTE — ED Notes (Signed)
Pt can go to room 402-1, service of Dr. Sindy Messing, call report to [phone]

## 2016-11-04 NOTE — Consult Note (Signed)
[facility] Face-to-Face Psychiatry Consult   Reason for Consult:  Suicidal ideations with multiple plans Referring Physician:  EDP Patient Identification: Suzanne Klein MRN:  [id number] Principal Diagnosis: Major depressive disorder, recurrent, severe without psychotic features [facility]) Diagnosis:   Patient Active Problem List   Diagnosis Date Noted  . Major depressive disorder, recurrent, severe without psychotic features ([place]) [F33.2]     Priority: High  . Schizoaffective disorder ([place]) [F25.9] 11/04/2016  . 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 Pharyngitis [J02.9] 08/21/2013  . Throat pain [R07.0] 08/21/2013  . Rash [R21] 08/21/2013  . Cervical insufficiency in pregnancy, antepartum [O34.30] 07/06/2013  . Abnormal quad screen [O28.0] 07/06/2013  . PTSD (post-traumatic stress disorder) [F43.10] 06/29/2013  . Anxiety [F41.9] 06/29/2013  . Borderline personality disorder [F60.3] 06/29/2013  . Supervision of other normal pregnancy [Z34.80] 05/21/2013  . Previous cesarean section [Z98.891] 05/21/2013  . Tobacco smoking complicating pregnancy [L89.373] 05/21/2013    Total Time spent with patient: 45 minutes  Subjective:   Suzanne Klein is a 36 y.o. female patient admitted with suicide plans.  HPI:  On admission:  36 y.o. female. Pt reports SI with multiple plans. Pt states she has thoughts of harming others. Pt reports AVH. Pt states that she feels she is having a psychotic breakdown. Pt states her mind is "a mess." Pt states she cannot concentrate. Pt reports past hospitalizations. Pt states she is currently receiving outpatient treatment at [facility]. Pt is prescribed Neurontin, Cymbalta, Minipress, and Vistaril. Pt states she has been diagnosed with "split personalities," MDD, PTSD, and anxiety. Pt reports daily panic attacks. Pt states she has voices telling her what to do. Pt reports numerous physical health concerns. Pt reports severe knee and  neck pain.  Today, patient requests medication changes and Rx as she is out of her medications, patient at [facility], next visit on 2/2.  Continues to endorse depression with vague suicidal ideations, no hallucinations or alcohol/drug abuse.  Pleasant, calm, and cooperative.  Past Psychiatric History: depression  Risk to Self: Is patient at risk for suicide?: Yes Risk to Others:  no  Prior Inpatient Therapy:  yes Prior Outpatient Therapy:  [facility]  Past Medical History:  Past Medical History:  Diagnosis Date  . Abnormal Pap smear   . Borderline personality disorder    No meds  . Heartburn in pregnancy   . HPV in female   . HPV in female   . Migraines   . Panic anxiety syndrome    no meds  . PTSD (post-traumatic stress disorder)    No meds  . Scoliosis of lumbar spine   . Termination of pregnancy    x 1    Past Surgical History:  Procedure Laterality Date  . CESAREAN SECTION     x 2  . CESAREAN SECTION  07/21/2012   Procedure: CESAREAN SECTION;  Surgeon: Frederico Hamman, MD;  Location: [facility];  Service: Obstetrics;  Laterality: N/A;  Repeat Cesarean Section Delivery Boy @ [phone], Apgars 9/9  . CESAREAN SECTION    . COLPOSCOPY    . DILATION AND CURETTAGE OF UTERUS    . WISDOM TOOTH EXTRACTION     Family History:  Family History  Problem Relation Age of Onset  . Cancer Mother   . Heart disease Mother   . Anesthesia problems Neg Hx    Family Psychiatric  History: none Social History:  History  Alcohol Use  . Yes     History  Drug Use No  Social History   Social History  . Marital status: Married    Spouse name: N/A  . Number of children: N/A  . Years of education: N/A   Social History Main Topics  . Smoking status: Current Every Day Smoker    Packs/day: 0.15    Years: 13.00    Types: Cigarettes  . Smokeless tobacco: Never Used  . Alcohol use Yes  . Drug use: No  . Sexual activity: Yes    Birth control/ protection: None     Comment: pregnant    Other Topics Concern  . None   Social History Narrative   ** Merged History Encounter **       Additional Social History:    Allergies:   Allergies  Allergen Reactions  . Poison Oak Extract [Poison Oak Extract] Anaphylaxis and Itching    Anaphylaxis occurs when the plant is burning    . Banana Nausea And Vomiting and Rash  . Latex Rash  . Poison Ivy Extract [Poison Ivy Extract] Rash  . Tomato Rash    Labs:  Results for orders placed or performed during the hospital encounter of 11/03/16 (from the past 48 hour(s))  Rapid urine drug screen (hospital performed)     Status: Abnormal   Collection Time: 11/03/16  4:52 PM  Result Value Ref Range   Opiates NONE DETECTED NONE DETECTED   Cocaine NONE DETECTED NONE DETECTED   Benzodiazepines NONE DETECTED NONE DETECTED   Amphetamines NONE DETECTED NONE DETECTED   Tetrahydrocannabinol POSITIVE (A) NONE DETECTED   Barbiturates NONE DETECTED NONE DETECTED    Comment:        DRUG SCREEN FOR MEDICAL PURPOSES ONLY.  IF CONFIRMATION IS NEEDED FOR ANY PURPOSE, NOTIFY LAB WITHIN 5 DAYS.        LOWEST DETECTABLE LIMITS FOR URINE DRUG SCREEN Drug Class       Cutoff (ng/mL) Amphetamine      1000 Barbiturate      200 Benzodiazepine   323 Tricyclics       557 Opiates          300 Cocaine          300 THC              50   Pregnancy, urine     Status: None   Collection Time: 11/03/16  4:52 PM  Result Value Ref Range   Preg Test, Ur NEGATIVE NEGATIVE    Comment:        THE SENSITIVITY OF THIS METHODOLOGY IS >20 mIU/mL.   Comprehensive metabolic panel     Status: Abnormal   Collection Time: 11/03/16  5:00 PM  Result Value Ref Range   Sodium 141 135 - 145 mmol/L   Potassium 3.5 3.5 - 5.1 mmol/L   Chloride 105 101 - 111 mmol/L   CO2 28 22 - 32 mmol/L   Glucose, Bld 119 (H) 65 - 99 mg/dL   BUN 16 6 - 20 mg/dL   Creatinine, Ser 0.74 0.44 - 1.00 mg/dL   Calcium 9.0 8.9 - 10.3 mg/dL   Total Protein 7.6 6.5 - 8.1 g/dL   Albumin  4.2 3.5 - 5.0 g/dL   AST 19 15 - 41 U/L   ALT 17 14 - 54 U/L   Alkaline Phosphatase 77 38 - 126 U/L   Total Bilirubin 0.5 0.3 - 1.2 mg/dL   GFR calc non Af Amer >60 >60 mL/min   GFR calc Af Amer >60 >60 mL/min  Comment: (NOTE) The eGFR has been calculated using the CKD EPI equation. This calculation has not been validated in all clinical situations. eGFR's persistently <60 mL/min signify possible Chronic Kidney Disease.    Anion gap 8 5 - 15  Ethanol     Status: None   Collection Time: 11/03/16  5:00 PM  Result Value Ref Range   Alcohol, Ethyl (B) <5 <5 mg/dL    Comment:        LOWEST DETECTABLE LIMIT FOR SERUM ALCOHOL IS 5 mg/dL FOR MEDICAL PURPOSES ONLY   Salicylate level     Status: None   Collection Time: 11/03/16  5:00 PM  Result Value Ref Range   Salicylate Lvl <2.4 2.8 - 30.0 mg/dL  Acetaminophen level     Status: Abnormal   Collection Time: 11/03/16  5:00 PM  Result Value Ref Range   Acetaminophen (Tylenol), Serum <10 (L) 10 - 30 ug/mL    Comment:        THERAPEUTIC CONCENTRATIONS VARY SIGNIFICANTLY. A RANGE OF 10-30 ug/mL MAY BE AN EFFECTIVE CONCENTRATION FOR MANY PATIENTS. HOWEVER, SOME ARE BEST TREATED AT CONCENTRATIONS OUTSIDE THIS RANGE. ACETAMINOPHEN CONCENTRATIONS >150 ug/mL AT 4 HOURS AFTER INGESTION AND >50 ug/mL AT 12 HOURS AFTER INGESTION ARE OFTEN ASSOCIATED WITH TOXIC REACTIONS.   cbc     Status: None   Collection Time: 11/03/16  5:00 PM  Result Value Ref Range   WBC 7.9 4.0 - 10.5 K/uL   RBC 4.56 3.87 - 5.11 MIL/uL   Hemoglobin 14.2 12.0 - 15.0 g/dL   HCT 41.7 36.0 - 46.0 %   MCV 91.4 78.0 - 100.0 fL   MCH 31.1 26.0 - 34.0 pg   MCHC 34.1 30.0 - 36.0 g/dL   RDW 13.8 11.5 - 15.5 %   Platelets 232 150 - 400 K/uL    Current Facility-Administered Medications  Medication Dose Route Frequency Provider Last Rate Last Dose  . [START ON 11/05/2016] DULoxetine (CYMBALTA) DR capsule 60 mg  60 mg Oral Daily Wing Schoch, MD      . gabapentin  (NEURONTIN) capsule 600 mg  600 mg Oral TID Corena Pilgrim, MD      . haloperidol (HALDOL) tablet 2 mg  2 mg Oral QHS Rosetta Rupnow, MD      . hydrOXYzine (ATARAX/VISTARIL) tablet 50 mg  50 mg Oral QHS Ulrick Methot, MD      . prazosin (MINIPRESS) capsule 2 mg  2 mg Oral QHS Deno Etienne, DO   Stopped at 11/03/16 2359   Current Outpatient Prescriptions  Medication Sig Dispense Refill  . DULoxetine (CYMBALTA) 30 MG capsule Take 30 mg by mouth daily.    Marland Kitchen gabapentin (NEURONTIN) 600 MG tablet Take 600 mg by mouth 4 (four) times daily.    . mirtazapine (REMERON) 15 MG tablet Take 7.5-15 mg by mouth at bedtime.    Marland Kitchen OLANZapine (ZYPREXA) 5 MG tablet Take 5 mg by mouth at bedtime.    . prazosin (MINIPRESS) 2 MG capsule Take 1 capsule (2 mg total) by mouth at bedtime. (Patient taking differently: Take 5 mg by mouth at bedtime. ) 30 capsule 0  . RaNITidine HCl (ZANTAC PO) Take 1-2 tablets by mouth daily as needed (heart burn).    Marland Kitchen azithromycin (ZITHROMAX Z-PAK) 250 MG tablet Take 1 tablet (250 mg total) by mouth daily. 562m PO day 1, then 2544mPO days 205 (Patient not taking: Reported on 11/03/2016) 6 tablet 0  . guaiFENesin-codeine 100-10 MG/5ML syrup Take 5 mLs by  mouth 3 (three) times daily as needed for cough. (Patient not taking: Reported on 11/03/2016) 120 mL 0  . HYDROcodone-acetaminophen (NORCO/VICODIN) 5-325 MG tablet Take 1-2 tablets by mouth every 6 (six) hours as needed. (Patient not taking: Reported on 11/03/2016) 5 tablet 0  . ibuprofen (ADVIL,MOTRIN) 400 MG tablet Take 1 tablet (400 mg total) by mouth every 6 (six) hours as needed. (Patient not taking: Reported on 11/03/2016) 30 tablet 0  . pantoprazole (PROTONIX) 20 MG tablet Take 1 tablet (20 mg total) by mouth daily. (Patient not taking: Reported on 11/03/2016) 30 tablet 0  . predniSONE (DELTASONE) 20 MG tablet Take 2 tablets (40 mg total) by mouth daily. (Patient not taking: Reported on 11/03/2016) 10 tablet 0     Musculoskeletal: Strength & Muscle Tone: within normal limits Gait & Station: normal Patient leans: N/A  Psychiatric Specialty Exam: Physical Exam  Constitutional: She is oriented to person, place, and time. She appears well-developed and well-nourished.  HENT:  Head: Normocephalic.  Neck: Normal range of motion.  Respiratory: Effort normal.  Musculoskeletal: Normal range of motion.  Neurological: She is alert and oriented to person, place, and time.  Psychiatric: Her speech is normal and behavior is normal. Judgment normal. Cognition and memory are normal. She exhibits a depressed mood. She expresses suicidal ideation. She expresses suicidal plans.    Review of Systems  Psychiatric/Behavioral: Positive for depression and suicidal ideas.  All other systems reviewed and are negative.   Blood pressure 126/63, pulse 68, temperature 97.5 F (36.4 C), temperature source Oral, resp. rate 19, height '5\' 6"'  (1.676 m), weight 117.9 kg (260 lb), last menstrual period 11/03/2016, SpO2 97 %.Body mass index is 41.97 kg/m.  General Appearance: Disheveled  Eye Contact:  Fair  Speech:  Normal Rate  Volume:  Decreased  Mood:  Depressed  Affect:  Congruent  Thought Process:  Coherent and Descriptions of Associations: Intact  Orientation:  Full (Time, Place, and Person)  Thought Content:  WDL  Suicidal Thoughts:  Yes.  with intent/plan  Homicidal Thoughts:  No  Memory:  Immediate;   Fair Recent;   Fair Remote;   Fair  Judgement:  Fair  Insight:  Fair  Psychomotor Activity:  Decreased  Concentration:  Concentration: Fair and Attention Span: Fair  Recall:  [employer] of Knowledge:  Fair  Language:  Good  Akathisia:  No  Handed:  Right  AIMS (if indicated):     Assets:  Housing Intimacy Leisure Time Physical Health Resilience Social Support  ADL's:  Intact  Cognition:  WNL  Sleep:        Treatment Plan Summary: Daily contact with patient to assess and evaluate symptoms and  progress in treatment, Medication management and Plan major depressive disorder, recurrent, severe without psychosis:  -Crisis stabilization -Medication management:  Continue medical medications along with Cymbalta 60 mg daily for depression, Remeron 15 mg at bedtime for sleep, Prazosin 2 mg at bedtime for nightmares, Vistaril 50 mg at bedtime anxiety, and Haldol 2 mg at bedtime for moood. -Individual counseling  Disposition: Recommend psychiatric Inpatient admission when medically cleared.  Waylan Boga, NP 11/04/2016 12:52 PM  Patient seen face-to-face for psychiatric evaluation, chart reviewed and case discussed with the physician extender and developed treatment plan. Reviewed the information documented and agree with the treatment plan. Corena Pilgrim, MD

## 2016-11-04 NOTE — BH Assessment (Signed)
[place] Assessment Progress Note  Per Corena Pilgrim, MD, this pt requires psychiatric hospitalization at this time.  The following facilities have been contacted to seek placement for this pt, with results as noted:  Beds available, information sent, decision pending:  [place]:  [facility] [place] Triage Specialist [phone]

## 2016-11-05 ENCOUNTER — Encounter (HOSPITAL_COMMUNITY): Payer: Self-pay

## 2016-11-05 ENCOUNTER — Inpatient Hospital Stay (HOSPITAL_COMMUNITY)
Admission: AD | Admit: 2016-11-05 | Discharge: 2016-11-13 | DRG: 885 | Disposition: A | Discharge status: Home / Self Care | Payer: Medicaid Other | Source: Intra-hospital | Attending: Psychiatry | Admitting: Psychiatry

## 2016-11-05 DIAGNOSIS — Z91410 Personal history of adult physical and sexual abuse: Secondary | ICD-10-CM | POA: Diagnosis not present

## 2016-11-05 DIAGNOSIS — Z9889 Other specified postprocedural states: Secondary | ICD-10-CM

## 2016-11-05 DIAGNOSIS — R45851 Suicidal ideations: Secondary | ICD-10-CM

## 2016-11-05 DIAGNOSIS — E669 Obesity, unspecified: Secondary | ICD-10-CM | POA: Diagnosis present

## 2016-11-05 DIAGNOSIS — Z808 Family history of malignant neoplasm of other organs or systems: Secondary | ICD-10-CM | POA: Diagnosis not present

## 2016-11-05 DIAGNOSIS — Z915 Personal history of self-harm: Secondary | ICD-10-CM

## 2016-11-05 DIAGNOSIS — F603 Borderline personality disorder: Secondary | ICD-10-CM | POA: Diagnosis present

## 2016-11-05 DIAGNOSIS — E039 Hypothyroidism, unspecified: Secondary | ICD-10-CM | POA: Diagnosis not present

## 2016-11-05 DIAGNOSIS — F82 Specific developmental disorder of motor function: Secondary | ICD-10-CM | POA: Diagnosis present

## 2016-11-05 DIAGNOSIS — Z6833 Body mass index (BMI) 33.0-33.9, adult: Secondary | ICD-10-CM | POA: Diagnosis not present

## 2016-11-05 DIAGNOSIS — Z8249 Family history of ischemic heart disease and other diseases of the circulatory system: Secondary | ICD-10-CM | POA: Diagnosis not present

## 2016-11-05 DIAGNOSIS — F4310 Post-traumatic stress disorder, unspecified: Secondary | ICD-10-CM | POA: Diagnosis present

## 2016-11-05 DIAGNOSIS — Z810 Family history of intellectual disabilities: Secondary | ICD-10-CM

## 2016-11-05 DIAGNOSIS — F332 Major depressive disorder, recurrent severe without psychotic features: Secondary | ICD-10-CM | POA: Diagnosis present

## 2016-11-05 DIAGNOSIS — Z809 Family history of malignant neoplasm, unspecified: Secondary | ICD-10-CM | POA: Diagnosis not present

## 2016-11-05 DIAGNOSIS — Z888 Allergy status to other drugs, medicaments and biological substances status: Secondary | ICD-10-CM

## 2016-11-05 DIAGNOSIS — Z79899 Other long term (current) drug therapy: Secondary | ICD-10-CM | POA: Diagnosis not present

## 2016-11-05 DIAGNOSIS — F333 Major depressive disorder, recurrent, severe with psychotic symptoms: Secondary | ICD-10-CM | POA: Diagnosis present

## 2016-11-05 DIAGNOSIS — F17210 Nicotine dependence, cigarettes, uncomplicated: Secondary | ICD-10-CM | POA: Diagnosis present

## 2016-11-05 DIAGNOSIS — G4700 Insomnia, unspecified: Secondary | ICD-10-CM | POA: Diagnosis present

## 2016-11-05 DIAGNOSIS — Z91040 Latex allergy status: Secondary | ICD-10-CM | POA: Diagnosis not present

## 2016-11-05 DIAGNOSIS — Z91018 Allergy to other foods: Secondary | ICD-10-CM | POA: Diagnosis not present

## 2016-11-05 MED ORDER — ACETAMINOPHEN 325 MG PO TABS
650.0000 mg | ORAL_TABLET | Freq: Four times a day (QID) | ORAL | Status: DC | PRN
Start: 2016-11-05 — End: 2016-11-13
  Administered 2016-11-05 – 2016-11-10 (×4): 650 mg via ORAL
  Filled 2016-11-05 (×5): qty 2

## 2016-11-05 MED ORDER — ARIPIPRAZOLE 2 MG PO TABS
2.0000 mg | ORAL_TABLET | Freq: Every day | ORAL | Status: DC
  Administered 2016-11-05 – 2016-11-12 (×8): 2 mg via ORAL
  Filled 2016-11-05 (×11): qty 1

## 2016-11-05 MED ORDER — HALOPERIDOL 2 MG PO TABS
2.0000 mg | ORAL_TABLET | Freq: Every day | ORAL | Status: DC
  Filled 2016-11-05: qty 1

## 2016-11-05 MED ORDER — HYDROXYZINE HCL 50 MG PO TABS
50.0000 mg | ORAL_TABLET | Freq: Every day | ORAL | Status: DC
  Filled 2016-11-05: qty 1

## 2016-11-05 MED ORDER — PRAZOSIN HCL 2 MG PO CAPS
2.0000 mg | ORAL_CAPSULE | Freq: Every day | ORAL | Status: DC
  Administered 2016-11-05 – 2016-11-09 (×5): 2 mg via ORAL
  Filled 2016-11-05 (×4): qty 1
  Filled 2016-11-05: qty 2
  Filled 2016-11-05 (×2): qty 1

## 2016-11-05 MED ORDER — GABAPENTIN 300 MG PO CAPS
600.0000 mg | ORAL_CAPSULE | Freq: Three times a day (TID) | ORAL | Status: DC
  Administered 2016-11-05 – 2016-11-08 (×11): 600 mg via ORAL
  Filled 2016-11-05 (×16): qty 2

## 2016-11-05 MED ORDER — TRAZODONE HCL 50 MG PO TABS
50.0000 mg | ORAL_TABLET | Freq: Every day | ORAL | Status: DC
  Administered 2016-11-05 – 2016-11-07 (×3): 50 mg via ORAL
  Filled 2016-11-05 (×5): qty 1

## 2016-11-05 MED ORDER — ALUM & MAG HYDROXIDE-SIMETH 200-200-20 MG/5ML PO SUSP
30.0000 mL | ORAL | Status: DC | PRN
  Administered 2016-11-05 – 2016-11-08 (×6): 30 mL via ORAL
  Filled 2016-11-05 (×6): qty 30

## 2016-11-05 MED ORDER — DULOXETINE HCL 60 MG PO CPEP
60.0000 mg | ORAL_CAPSULE | Freq: Every day | ORAL | Status: DC
  Administered 2016-11-05: 60 mg via ORAL
  Filled 2016-11-05 (×2): qty 1

## 2016-11-05 MED ORDER — DULOXETINE HCL 30 MG PO CPEP
30.0000 mg | ORAL_CAPSULE | Freq: Every evening | ORAL | Status: DC
  Filled 2016-11-05 (×2): qty 1

## 2016-11-05 MED ORDER — HYDROXYZINE HCL 25 MG PO TABS
25.0000 mg | ORAL_TABLET | Freq: Three times a day (TID) | ORAL | Status: DC | PRN
  Administered 2016-11-06 – 2016-11-12 (×6): 25 mg via ORAL
  Filled 2016-11-05 (×8): qty 1

## 2016-11-05 MED ORDER — INFLUENZA VAC SPLIT QUAD 0.5 ML IM SUSY
0.5000 mL | PREFILLED_SYRINGE | INTRAMUSCULAR | Status: DC
  Filled 2016-11-05: qty 0.5

## 2016-11-05 MED ORDER — PNEUMOCOCCAL VAC POLYVALENT 25 MCG/0.5ML IJ INJ
0.5000 mL | INJECTION | INTRAMUSCULAR | Status: AC
  Administered 2016-11-06: 0.5 mL via INTRAMUSCULAR

## 2016-11-05 MED ORDER — PAROXETINE HCL 20 MG PO TABS
20.0000 mg | ORAL_TABLET | Freq: Every day | ORAL | Status: DC
  Administered 2016-11-06 – 2016-11-13 (×8): 20 mg via ORAL
  Filled 2016-11-05 (×11): qty 1

## 2016-11-05 MED ORDER — MAGNESIUM HYDROXIDE 400 MG/5ML PO SUSP
30.0000 mL | Freq: Every day | ORAL | Status: DC | PRN
  Administered 2016-11-06 – 2016-11-08 (×2): 30 mL via ORAL
  Filled 2016-11-05 (×2): qty 30

## 2016-11-05 NOTE — Progress Notes (Signed)
Recreation Therapy Notes  Date: 11/05/16 Time: 1000 Location: 500 Hall Dayroom  Group Topic: Coping Skills  Goal Area(s) Addresses:  Patients will be able to identify positive coping skills. Patients will be able to identify benefits of coping skills. Patients will be able to identify benefits of using coping skills post d/c.  Intervention:  Magazines, scissors, glue sticks, coping skills worksheet  Activity: My Coping Skills.  Patients were given a worksheet that was divided into 5 categories.  The categories were diversions, social, cognitive, tension releasers and physical.  Patients were to look through the magazines and find pictures of coping skills that fit each category.  Education: Radiographer, therapeutic, Dentist.   Education Outcome: Acknowledges understanding/In group clarification offered/Needs additional education.   Clinical Observations/Feedback:  Pt did not attend group.   Victorino Sparrow, LRT/CTRS       Victorino Sparrow A 11/05/2016 12:22 PM
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
## 2016-11-05 NOTE — BHH Suicide Risk Assessment (Signed)
[facility] Admission Suicide Risk Assessment   Nursing information obtained from:    Demographic factors:    Current Mental Status:    Loss Factors:    Historical Factors:    Risk Reduction Factors:     Total Time spent with patient: 30 minutes Principal Problem: Major depressive disorder, recurrent severe without psychotic features ([place]) Diagnosis:   Patient Active Problem List   Diagnosis Date Noted  . Major depressive disorder, recurrent severe without psychotic features ([place]) [F33.2] 11/05/2016  . Schizoaffective disorder ([place]) [F25.9] 11/04/2016  . Major depressive disorder, recurrent, severe without psychotic features ([place]) [F33.2]   . 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 Pharyngitis [J02.9] 08/21/2013  . Throat pain [R07.0] 08/21/2013  . Rash [R21] 08/21/2013  . Cervical insufficiency in pregnancy, antepartum [O34.30] 07/06/2013  . Abnormal quad screen [O28.0] 07/06/2013  . PTSD (post-traumatic stress disorder) [F43.10] 06/29/2013  . Anxiety [F41.9] 06/29/2013  . Borderline personality disorder [F60.3] 06/29/2013  . Supervision of other normal pregnancy [Z34.80] 05/21/2013  . Previous cesarean section [Z98.891] 05/21/2013  . Tobacco smoking complicating pregnancy [id number] 05/21/2013   Subjective Data: Please see H&P.   Continued Clinical Symptoms:  Alcohol Use Disorder Identification Test Final Score (AUDIT): 0 The "Alcohol Use Disorders Identification Test", Guidelines for Use in Primary Care, Second Edition.  World Pharmacologist [facility]). Score between 0-7:  no or low risk or alcohol related problems. Score between 8-15:  moderate risk of alcohol related problems. Score between 16-19:  high risk of alcohol related problems. Score 20 or above:  warrants further diagnostic evaluation for alcohol dependence and treatment.   CLINICAL FACTORS:   Alcohol/Substance Abuse/Dependencies Previous Psychiatric Diagnoses and  Treatments   Musculoskeletal: Strength & Muscle Tone: within normal limits Gait & Station: normal Patient leans: N/A  Psychiatric Specialty Exam: Physical Exam  Review of Systems  Psychiatric/Behavioral: Positive for depression, hallucinations, substance abuse and suicidal ideas. The patient is nervous/anxious and has insomnia.   All other systems reviewed and are negative.   Blood pressure (!) 99/46, pulse 84, temperature 97.8 F (36.6 C), temperature source Oral, resp. rate 18, height 5\' 6"  (1.676 m), weight 95.3 kg (210 lb), last menstrual period 11/03/2016, SpO2 99 %.Body mass index is 33.89 kg/m.                    Please see H&P.                                 Sleep:  Number of Hours: 5      COGNITIVE FEATURES THAT CONTRIBUTE TO RISK:  Closed-mindedness, Polarized thinking and Thought constriction (tunnel vision)    SUICIDE RISK:   Moderate:  Frequent suicidal ideation with limited intensity, and duration, some specificity in terms of plans, no associated intent, good self-control, limited dysphoria/symptomatology, some risk factors present, and identifiable protective factors, including available and accessible social support.   PLAN OF CARE: Please see H&P.   I certify that inpatient services furnished can reasonably be expected to improve the patient's condition.  Ebone Alcivar, MD 11/05/2016, 12:41 PM

## 2016-11-05 NOTE — Tx Team (Signed)
Interdisciplinary Treatment and Diagnostic Plan Update  11/05/2016 Time of Session: 3:03 PM  Drenda Sobecki MRN: [id number]  Principal Diagnosis: Major depressive disorder, recurrent severe without psychotic features [facility])  Secondary Diagnoses: Principal Problem:   Major depressive disorder, recurrent severe without psychotic features ([place]) Active Problems:   PTSD (post-traumatic stress disorder)   Borderline personality disorder   Current Medications:  Current Facility-Administered Medications  Medication Dose Route Frequency Provider Last Rate Last Dose  . acetaminophen (TYLENOL) tablet 650 mg  650 mg Oral Q6H PRN Patrecia Pour, NP   650 mg at 11/05/16 0555  . alum & mag hydroxide-simeth (MAALOX/MYLANTA) 200-200-20 MG/5ML suspension 30 mL  30 mL Oral Q4H PRN Patrecia Pour, NP   30 mL at 11/05/16 1133  . ARIPiprazole (ABILIFY) tablet 2 mg  2 mg Oral QHS Ursula Alert, MD      . Derrill Memo ON 11/06/2016] DULoxetine (CYMBALTA) DR capsule 30 mg  30 mg Oral QPM Saramma Eappen, MD      . gabapentin (NEURONTIN) capsule 600 mg  600 mg Oral TID Patrecia Pour, NP   600 mg at 11/05/16 1134  . hydrOXYzine (ATARAX/VISTARIL) tablet 25 mg  25 mg Oral TID PRN Ursula Alert, MD      . Derrill Memo ON 11/06/2016] Influenza vac split quadrivalent PF (FLUARIX) injection 0.5 mL  0.5 mL Intramuscular Tomorrow-1000 Rozetta Nunnery, NP      . magnesium hydroxide (MILK OF MAGNESIA) suspension 30 mL  30 mL Oral Daily PRN Patrecia Pour, NP      . Derrill Memo ON 11/06/2016] PARoxetine (PAXIL) tablet 20 mg  20 mg Oral Daily Ursula Alert, MD      . Derrill Memo ON 11/06/2016] pneumococcal 23 valent vaccine (PNU-IMMUNE) injection 0.5 mL  0.5 mL Intramuscular Tomorrow-1000 Rozetta Nunnery, NP      . prazosin (MINIPRESS) capsule 2 mg  2 mg Oral QHS Patrecia Pour, NP      . traZODone (DESYREL) tablet 50 mg  50 mg Oral QHS Ursula Alert, MD        PTA Medications: Prescriptions Prior to Admission  Medication Sig Dispense Refill  Last Dose  . DULoxetine (CYMBALTA) 30 MG capsule Take 30 mg by mouth daily.   11/02/2016 at Unknown time  . gabapentin (NEURONTIN) 600 MG tablet Take 600 mg by mouth 4 (four) times daily.   11/03/2016 at Unknown time  . mirtazapine (REMERON) 15 MG tablet Take 7.5-15 mg by mouth at bedtime.   11/02/2016 at Unknown time  . OLANZapine (ZYPREXA) 5 MG tablet Take 5 mg by mouth at bedtime.   11/02/2016 at Unknown time  . prazosin (MINIPRESS) 1 MG capsule Take 5 mg by mouth at bedtime.     . RaNITidine HCl (ZANTAC PO) Take 1-2 tablets by mouth daily as needed (heart burn).   Past Week at Unknown time  . guaiFENesin-codeine 100-10 MG/5ML syrup Take 5 mLs by mouth 3 (three) times daily as needed for cough. (Patient not taking: Reported on 11/05/2016) 120 mL 0 Not Taking at Unknown time  . HYDROcodone-acetaminophen (NORCO/VICODIN) 5-325 MG tablet Take 1-2 tablets by mouth every 6 (six) hours as needed. (Patient not taking: Reported on 11/05/2016) 5 tablet 0 Not Taking at Unknown time  . ibuprofen (ADVIL,MOTRIN) 400 MG tablet Take 1 tablet (400 mg total) by mouth every 6 (six) hours as needed. (Patient not taking: Reported on 11/05/2016) 30 tablet 0 Not Taking at Unknown time  . pantoprazole (PROTONIX) 20 MG tablet Take 1  tablet (20 mg total) by mouth daily. (Patient not taking: Reported on 11/05/2016) 30 tablet 0 Not Taking at Unknown time  . prazosin (MINIPRESS) 2 MG capsule Take 1 capsule (2 mg total) by mouth at bedtime. (Patient not taking: Reported on 11/05/2016) 30 capsule 0 Not Taking at Unknown time    Treatment Modalities: Medication Management, Group therapy, Case management,  1 to 1 session with clinician, Psychoeducation, Recreational therapy.   Physician Treatment Plan for Primary Diagnosis: Major depressive disorder, recurrent severe without psychotic features ([place]) Long Term Goal(s): Improvement in symptoms so as ready for discharge  Short Term Goals: Ability to identify changes in lifestyle to reduce  recurrence of condition will improve  Medication Management: Evaluate patient's response, side effects, and tolerance of medication regimen.  Therapeutic Interventions: 1 to 1 sessions, Unit Group sessions and Medication administration.  Evaluation of Outcomes: Not Met  Physician Treatment Plan for Secondary Diagnosis: Principal Problem:   Major depressive disorder, recurrent severe without psychotic features ([place]) Active Problems:   PTSD (post-traumatic stress disorder)   Borderline personality disorder   Long Term Goal(s): Improvement in symptoms so as ready for discharge  Short Term Goals: Ability to demonstrate self-control will improve  Medication Management: Evaluate patient's response, side effects, and tolerance of medication regimen.  Therapeutic Interventions: 1 to 1 sessions, Unit Group sessions and Medication administration.  Evaluation of Outcomes: Not Met   RN Treatment Plan for Primary Diagnosis: Major depressive disorder, recurrent severe without psychotic features ([place]) Long Term Goal(s): Knowledge of disease and therapeutic regimen to maintain health will improve  Short Term Goals: Ability to disclose and discuss suicidal ideas and Ability to identify and develop effective coping behaviors will improve  Medication Management: RN will administer medications as ordered by provider, will assess and evaluate patient's response and provide education to patient for prescribed medication. RN will report any adverse and/or side effects to prescribing provider.  Therapeutic Interventions: 1 on 1 counseling sessions, Psychoeducation, Medication administration, Evaluate responses to treatment, Monitor vital signs and CBGs as ordered, Perform/monitor CIWA, COWS, AIMS and Fall Risk screenings as ordered, Perform wound care treatments as ordered.  Evaluation of Outcomes: Not Met   LCSW Treatment Plan for Primary Diagnosis: Major depressive disorder, recurrent severe without  psychotic features ([place]) Long Term Goal(s): Safe transition to appropriate next level of care at discharge, Engage patient in therapeutic group addressing interpersonal concerns.  Short Term Goals: Engage patient in aftercare planning with referrals and resources and Increase skills for wellness and recovery  Therapeutic Interventions: Assess for all discharge needs, 1 to 1 time with Social worker, Explore available resources and support systems, Assess for adequacy in community support network, Educate family and significant other(s) on suicide prevention, Complete Psychosocial Assessment, Interpersonal group therapy.  Evaluation of Outcomes: Not Met   Progress in Treatment: Attending groups: Yes Participating in groups: Yes Taking medication as prescribed: Yes, MD continues to assess for medication changes as needed Toleration medication: Yes, no side effects reported at this time Family/Significant other contact made: Yes, Keturah Shavers (husband, [phone])  Patient understands diagnosis: Limited insight  Discussing patient identified problems/goals with staff: Yes Medical problems stabilized or resolved: Yes Denies suicidal/homicidal ideation: No  Issues/concerns per patient self-inventory: None Other: N/A  New problem(s) identified: None identified at this time.   New Short Term/Long Term Goal(s): None identified at this time.   Discharge Plan or Barriers: Return home, and follow up with [facility].   Reason for Continuation of Hospitalization: Anxiety Depression Hallucinations  Medication stabilization Suicidal ideation  Estimated Length of Stay: 3-5 days  Attendees: Patient: 11/05/2016  3:03 PM  Physician: Dr. Shea Evans 11/05/2016  3:03 PM  Nursing: Benjamine Mola. Jenetta Downer RN 11/05/2016  3:03 PM  RN Care Manager: Lars Pinks 11/05/2016  3:03 PM  Social Worker: Ripley Fraise, LCSW 11/05/2016  3:03 PM  Recreational Therapist: Winfield Cunas 11/05/2016  3:03 PM  Other: Radonna Ricker,  Social Work Intern  11/05/2016  3:03 PM  Other:  11/05/2016  3:03 PM  Other: 11/05/2016  3:03 PM    Scribe for Treatment Team: Radonna Ricker, Social Work Intern 11/05/2016 3:03 PM

## 2016-11-05 NOTE — Progress Notes (Signed)
Nursing Progress Note 7p-7a  D) Patient presents pleasant and cooperative. Patient reports having a headache 5/10. Patient states "i think it's from the haldol they give me, none of my other meds give me a headache". Patient denies SI/HI at this time and contracts for safety. Patient reports AVH stating "my girls are still with me". When asked to clarify, patient reports "my other personalities, there are about 7 or 8 of them. I've had them since childhood, they build me up and give me confidence. They started off as imaginary friends". Patient in no acute distress.  A) Emotional support given. PRN tylenol given as prescribed. Patient on q15 min safety checks. Opportunities for questions or concerns presented to patient. Patient encouraged to continue to prepare for first day on the unit.  R) Patient receptive to interaction with nurse. Patient remains safe on the unit at this time. Will continue to monitor.

## 2016-11-05 NOTE — [facility] Suicide Risk Assessment (Signed)
Suzanne Klein INPATIENT:  Family/Significant Other Suicide Prevention Education  Suicide Prevention Education:  Education Completed; Suzanne Klein (husband, [phone])  has been identified by the patient as the family member/significant other with whom the patient will be residing, and identified as the person(s) who will aid the patient in the event of a mental health crisis (suicidal ideations/suicide attempt).  With written consent from the patient, the family member/significant other has been provided the following suicide prevention education, prior to the and/or following the discharge of the patient.  The suicide prevention education provided includes the following:  Suicide risk factors  Suicide prevention and interventions  National Suicide Hotline telephone number  [facility] assessment telephone number  [facility] Emergency Assistance [place] and/or Residential Mobile Crisis Unit telephone number  Request made of family/significant other to:  Remove weapons (e.g., guns, rifles, knives), all items previously/currently identified as safety concern.    Remove drugs/medications (over-the-counter, prescriptions, illicit drugs), all items previously/currently identified as a safety concern.  The family member/significant other verbalizes understanding of the suicide prevention education information provided.  The family member/significant other agrees to remove the items of safety concern listed above.  Radonna Ricker 11/05/2016, 2:56 PM

## 2016-11-05 NOTE — Progress Notes (Signed)
Nursing Progress Note 7p-7a  D) Patient is sleeping on the unit at this time. Respirations even and unlabored.   A) Patient on q15 min safety checks.  R) Patient remains safe on the unit at this time. Patient is sleeping in bed without complaints. Will continue to monitor.

## 2016-11-05 NOTE — Plan of Care (Signed)
Problem: Safety: Goal: Periods of time without injury will increase Outcome: Progressing Patient contracts for safety; patient on q15 min safety checks. Fall safety reviewed with patient; patient able to make needs known.
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
## 2016-11-05 NOTE — Progress Notes (Signed)
Recreation Therapy Notes  INPATIENT RECREATION THERAPY ASSESSMENT  Patient Details Name: Suzanne Klein MRN: [id number] DOB: June 09, 1981 Today's Date: 11/05/2016  Patient Stressors: Family, Other ([place]) (Two of her children being adopted out)  Pt stated she was her for S/I and was about to have Klein nervous breakdown.  Coping Skills:   Avoidance, Exercise, Art/Dance, Talking, Music  Personal Challenges: Anger, Communication, Concentration, Decision-Making, Expressing Yourself, Problem-Solving, Self-Esteem/Confidence, Social Interaction, Stress Management, Trusting Others  Leisure Interests (2+):  Individual - Reading, Music - Listen, Art - Draw, Social - Family, Individual - Other (Klein) ([employer])  Awareness of Community Resources:  Yes  Community Resources:  Library, [employer], Other ([place]) (Museum, [facility])  Current Use: Yes  Patient Strengths:  Art; writing  Patient Identified Areas of Improvement:  Standing up for myself; self-esteem  Current Recreation Participation:  Don't do anything  Patient Goal for Hospitalization:  "To be better than I am, get more energy"  Page of Residence:  [place] of Residence:  [place]  Current SI (including self-harm):  No  Current HI:  Yes (Towards ex-husband; rated Klein 10)  Consent to Intern Participation: N/Klein   Suzanne Klein, LRT/CTRS  Suzanne Klein, Suzanne Klein 11/05/2016, 1:09 PM

## 2016-11-05 NOTE — Plan of Care (Signed)
Problem: Coping: Goal: Ability to cope will improve Outcome: Not Progressing Pt passive SI, but contracts for safety at this time
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
## 2016-11-05 NOTE — Progress Notes (Signed)
D: Pt passive SI/HI-contracts for safety. Pt is pleasant and cooperative. Pt keeps to herself, but is visible on the unit at times. Pt was happy her Bp was up high enough for her to get her Minipress  A: Pt was offered support and encouragement. Pt was given scheduled medications. Pt was encourage to attend groups. Q 15 minute checks were done for safety.   R: Pt is taking medication. Pt has no complaints.Pt receptive to treatment and safety maintained on unit.

## 2016-11-05 NOTE — Tx Team (Addendum)
Initial Treatment Plan 11/05/2016 2:36 AM Garlon Hatchet [id number]    PATIENT STRESSORS: Financial difficulties Health problems Marital or family conflict Medication change or noncompliance   PATIENT STRENGTHS: Capable of independent living Communication skills General fund of knowledge Motivation for treatment/growth Supportive family/friends   PATIENT IDENTIFIED PROBLEMS: Depression  Anxiety  "to get stable on medications that actually work"  "get back to how I was"  Risk for suicide             DISCHARGE CRITERIA:  Ability to meet basic life and health needs Adequate post-discharge living arrangements Improved stabilization in mood, thinking, and/or behavior Medical problems require only outpatient monitoring Motivation to continue treatment in a less acute level of care Need for constant or close observation no longer present Reduction of life-threatening or endangering symptoms to within safe limits Safe-care adequate arrangements made Verbal commitment to aftercare and medication compliance  PRELIMINARY DISCHARGE PLAN: Outpatient therapy  PATIENT/FAMILY INVOLVEMENT: This treatment plan has been presented to and reviewed with the patient, Suzanne Klein.  The patient and family have been given the opportunity to ask questions and make suggestions.  Annia Friendly, RN 11/05/2016, 2:36 AM
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
## 2016-11-05 NOTE — Progress Notes (Signed)
DAR NOTE: Patient presents with anxious affect and sad mood.  Endorses auditory and visual hallucinations.  Described energy level as low and concentration as poor.  Rates depression at 8, hopelessness at 8, and anxiety at 8.  Maintained on routine safety checks.  Medications given as prescribed.  Support and encouragement offered as needed.  Attended group and participated.  Patient was visible in milieu for activities.   Patient received Mylanta 30 ml for complain of heartburn with good effect.

## 2016-11-06 MED ORDER — DULOXETINE HCL 20 MG PO CPEP
20.0000 mg | ORAL_CAPSULE | Freq: Every evening | ORAL | Status: DC
  Administered 2016-11-06: 20 mg via ORAL
  Filled 2016-11-06 (×4): qty 1

## 2016-11-06 MED ORDER — HYDROXYZINE HCL 25 MG PO TABS
25.0000 mg | ORAL_TABLET | Freq: Once | ORAL | Status: AC
  Administered 2016-11-06: 25 mg via ORAL

## 2016-11-06 NOTE — BHH Group Notes (Signed)
Adult Psychoeducational Group Note  Date:  11/06/2016 Time:  1000am  Group Topic/Focus:  Goals Group:   The focus of this group is to help patients establish daily goals to achieve during treatment and discuss how the patient can incorporate goal setting into their daily lives to aide in recovery.   Participation Level:  Did Not Attend  Dola Factor 11/06/2016, 1030am

## 2016-11-06 NOTE — Progress Notes (Signed)
[facility] MD Progress Note  11/06/2016 1:35 PM Suzanne Klein  MRN:  [id number]  Subjective:  Patient reports " I having bad anxiety today, I miss my kids"  Objective: Suzanne Klein is awake, alert and oriented *3. Seen resting in dayroom interacting with others. Denies suicidal or homicidal ideation. Denies auditory or visual hallucination and does not appear to be responding to internal stimuli. Patient reports after group she became tearful and her anxiety was through "the roof." Patient reports she is medication compliant without mediation side effects. Report learning new coping skills. States her depression 2/10. Reports good appetite and resting well. Support, encouragement and reassurance was provided.   Principal Problem: Major depressive disorder, recurrent severe without psychotic features ([place]) Diagnosis:   Patient Active Problem List   Diagnosis Date Noted  . Major depressive disorder, recurrent severe without psychotic features ([place]) [F33.2] 11/05/2016  . Schizoaffective disorder ([place]) [F25.9] 11/04/2016  . Major depressive disorder, recurrent, severe without psychotic features ([place]) [F33.2]   . 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 Pharyngitis [J02.9] 08/21/2013  . Throat pain [R07.0] 08/21/2013  . Rash [R21] 08/21/2013  . Cervical insufficiency in pregnancy, antepartum [O34.30] 07/06/2013  . Abnormal quad screen [O28.0] 07/06/2013  . PTSD (post-traumatic stress disorder) [F43.10] 06/29/2013  . Anxiety [F41.9] 06/29/2013  . Borderline personality disorder [F60.3] 06/29/2013  . Supervision of other normal pregnancy [Z34.80] 05/21/2013  . Previous cesarean section [Z98.891] 05/21/2013  . Tobacco smoking complicating pregnancy 99991111 05/21/2013   Total Time spent with patient: 30 minutes  Past Psychiatric History:  Past Medical History:  Past Medical History:  Diagnosis Date  . Abnormal Pap smear   . Borderline  personality disorder    No meds  . Heartburn in pregnancy   . HPV in female   . HPV in female   . Migraines   . Panic anxiety syndrome    no meds  . PTSD (post-traumatic stress disorder)    No meds  . Scoliosis of lumbar spine   . Termination of pregnancy    x 1    Past Surgical History:  Procedure Laterality Date  . CESAREAN SECTION     x 2  . CESAREAN SECTION  07/21/2012   Procedure: CESAREAN SECTION;  Surgeon: Frederico Hamman, MD;  Location: [facility];  Service: Obstetrics;  Laterality: N/A;  Repeat Cesarean Section Delivery Boy @ [phone], Apgars 9/9  . CESAREAN SECTION    . COLPOSCOPY    . DILATION AND CURETTAGE OF UTERUS    . WISDOM TOOTH EXTRACTION     Family History:  Family History  Problem Relation Age of Onset  . Cancer Mother   . Heart disease Mother   . Dementia Maternal Grandfather   . Anesthesia problems Neg Hx    Family Psychiatric  History: Social History:  History  Alcohol Use  . Yes     History  Drug Use No    Social History   Social History  . Marital status: Married    Spouse name: N/A  . Number of children: N/A  . Years of education: N/A   Social History Main Topics  . Smoking status: Current Every Day Smoker    Packs/day: 0.15    Years: 13.00    Types: Cigarettes  . Smokeless tobacco: Never Used  . Alcohol use Yes  . Drug use: No  . Sexual activity: Yes    Birth control/ protection: None     Comment: pregnant   Other  Topics Concern  . None   Social History Narrative   ** Merged History Encounter **       Additional Social History:                         Sleep: Fair  Appetite:  Fair  Current Medications: Current Facility-Administered Medications  Medication Dose Route Frequency Provider Last Rate Last Dose  . acetaminophen (TYLENOL) tablet 650 mg  650 mg Oral Q6H PRN Patrecia Pour, NP   650 mg at 11/06/16 1106  . alum & mag hydroxide-simeth (MAALOX/MYLANTA) 200-200-20 MG/5ML suspension 30 mL  30 mL Oral Q4H  PRN Patrecia Pour, NP   30 mL at 11/06/16 0827  . ARIPiprazole (ABILIFY) tablet 2 mg  2 mg Oral QHS Ursula Alert, MD   2 mg at 11/05/16 2115  . DULoxetine (CYMBALTA) DR capsule 30 mg  30 mg Oral QPM Saramma Eappen, MD      . gabapentin (NEURONTIN) capsule 600 mg  600 mg Oral TID Patrecia Pour, NP   600 mg at 11/06/16 1106  . hydrOXYzine (ATARAX/VISTARIL) tablet 25 mg  25 mg Oral TID PRN Ursula Alert, MD   25 mg at 11/06/16 1330  . Influenza vac split quadrivalent PF (FLUARIX) injection 0.5 mL  0.5 mL Intramuscular Tomorrow-1000 Rozetta Nunnery, NP      . magnesium hydroxide (MILK OF MAGNESIA) suspension 30 mL  30 mL Oral Daily PRN Patrecia Pour, NP      . PARoxetine (PAXIL) tablet 20 mg  20 mg Oral Daily Ursula Alert, MD   20 mg at 11/06/16 0826  . prazosin (MINIPRESS) capsule 2 mg  2 mg Oral QHS Patrecia Pour, NP   2 mg at 11/05/16 2115  . traZODone (DESYREL) tablet 50 mg  50 mg Oral QHS Ursula Alert, MD   50 mg at 11/05/16 2115    Lab Results: No results found for this or any previous visit (from the past 48 hour(s)).  Blood Alcohol level:  Lab Results  Component Value Date   ETH <5 11/03/2016   ETH <5 [id number]    Metabolic Disorder Labs: No results found for: HGBA1C, MPG No results found for: PROLACTIN No results found for: CHOL, TRIG, HDL, CHOLHDL, VLDL, LDLCALC  Physical Findings: AIMS: Facial and Oral Movements Muscles of Facial Expression: None, normal Lips and Perioral Area: None, normal Jaw: None, normal Tongue: None, normal,Extremity Movements Upper (arms, wrists, hands, fingers): None, normal Lower (legs, knees, ankles, toes): None, normal, Trunk Movements Neck, shoulders, hips: None, normal, Overall Severity Severity of abnormal movements (highest score from questions above): None, normal Incapacitation due to abnormal movements: None, normal Patient's awareness of abnormal movements (rate only patient's report): No Awareness, Dental Status Current  problems with teeth and/or dentures?: No Does patient usually wear dentures?: No  CIWA:    COWS:     Musculoskeletal: Strength & Muscle Tone: within normal limits Gait & Station: normal Patient leans: N/A  Psychiatric Specialty Exam: Physical Exam  Nursing note and vitals reviewed. Constitutional: She is oriented to person, place, and time.  Neurological: She is alert and oriented to person, place, and time.  Psychiatric: She has a normal mood and affect. Her behavior is normal.    ROS  Blood pressure 109/75, pulse (!) 103, temperature 97.7 F (36.5 C), resp. rate 20, height 5\' 6"  (1.676 m), weight 95.3 kg (210 lb), last menstrual period 11/03/2016, SpO2 99 %.Body mass index  is 33.89 kg/m.  General Appearance: Casual  Eye Contact:  Good  Speech:  Clear and Coherent  Volume:  Normal  Mood:  Anxious and Depressed  Affect:  Congruent  Thought Process:  Coherent  Orientation:  Full (Time, Place, and Person)  Thought Content:  Hallucinations: None  Suicidal Thoughts:  No  Homicidal Thoughts:  No  Memory:  Immediate;   Fair Recent;   Fair Remote;   Fair  Judgement:  Good  Insight:  Fair  Psychomotor Activity:  Normal  Concentration:  Concentration: Fair  Recall:  [employer] of Knowledge:  Fair  Language:  Good  Akathisia:  No  Handed:  Right  AIMS (if indicated):     Assets:  Desire for Improvement Intimacy Talents/Skills  ADL's:  Intact  Cognition:  WNL  Sleep:  Number of Hours: 6.5    I agree with current treatment plan on 11/06/2016, Patient seen face-to-face for psychiatric evaluation follow-up, chart reviewed. Reviewed the information documented and agree with the treatment plan.   Treatment Plan Summary: Daily contact with patient to assess and evaluate symptoms and progress in treatment and Medication management   Patient will benefit from inpatient treatment and stabilization.   Estimated length of stay is 5-7 days.   For Depressive /affective  sx: Cross titrate Paxil with Cymbalta.  -Reduce Cymbalta 30 to 20 mg po daily, Contiune Paxil 20 mg - up titrate as needed.  For Psychosis: Start Abilify 2 mg po qhs. Discontinue Haldol - pt is scared of SE since her BF had SE.  For Insomnia: Trazodone 50 mg po qhs.  For nightmares: Prazosin 2 mg po qhs . Pt wants to stay on 2 mg - will monitor her BP.  For anxiety sx; Neurontin 600 mg po tid.  For cannabis abuse: Provided counseling.  Reviewed past medical records,treatment plan.   Will continue to monitor vitals ,medication compliance and treatment side effects while patient is here.   Will monitor for medical issues as well as call consult as needed.   Reviewed labs cmp- wnl, cbc - wnl, uds- THC, will get tsh, lipid panel, hba1c, pl.  Will get EKG for qtc monitoring since she is on Abilify.  CSW will start working on disposition.   Patient to participate in therapeutic milieu    Derrill Center, NP 11/06/2016, 1:35 PM   Agree with NP Progress Note

## 2016-11-06 NOTE — Progress Notes (Signed)
D:  Per pt self inventory pt reports sleeping good, appetite fair, energy level low, ability to pay attention poor, rates depression at a 10 out of 10, hopelessness at a 10 out of 10, anxiety at a 10 out of 10, denies SI/HI/AVH, goal today: "I don't know, to get thru to tomorrow", anxious during interaction, NP aware.     A:  Emotional support provided, Encouraged patient to deep breathe and identify some healthy coping skills for dealing with anxiety, Encouraged pt to continue with treatment plan and attend all group activities, q15 min checks maintained for safety.  R:  Pt is receptive, taking medications as prescribed, going to some groups, pleasant and cooperative with staff and other patients on the unit.

## 2016-11-06 NOTE — BHH Group Notes (Signed)
[place] Group Notes:  (Clinical Social Work)  11/06/2016  11:15-12:00PM  Summary of Progress/Problems:   Today's process group involved patients discussing their feelings related to being hospitalized, as well as benefits they see to being in the hospital.  The group then discussed ways they can stay out of the hospital by pursuing their wellness. The patient expressed a primary feeling about being hospitalized is "I love it because I want to get better."  She stated that she calls the period of time between September and  February "the days of darkness" because of things that have happened that trigger her during those months. She feels she is making progress in recognizing triggers, and in using coping skills including writing in her journal, reading, doing art and listening to music.  She stated that internally she is like a volcano that, if she starts to allow any of the pressure to escape, will start to erupt and never stop.  Therefore she often feels it is necessary to stuff down her feelings and block everything.  She expressed fear that if she does not do so, one of her personalities will take over and she will have blackouts.  She received much support from group for insight and effort.  Type of Therapy:  Group Therapy - Process  Participation Level:  Active  Participation Quality:  Attentive and Sharing  Affect:  Appropriate  Cognitive:  Appropriate  Insight:  Engaged  Engagement in Therapy:  Engaged  Modes of Intervention:  Exploration, Discussion  Selmer Dominion, LCSW 11/06/2016, 12:27 PM

## 2016-11-07 MED ORDER — PANTOPRAZOLE SODIUM 40 MG PO TBEC
40.0000 mg | DELAYED_RELEASE_TABLET | Freq: Every day | ORAL | Status: DC
  Administered 2016-11-07 – 2016-11-13 (×7): 40 mg via ORAL
  Filled 2016-11-07 (×11): qty 1
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
## 2016-11-07 NOTE — BHH Group Notes (Signed)
Pt rated her day a 0. Pt goal for tomorrow is to have a better day then she did today. Pt wants to talk with doctor about medication and see if she needs to get anything switched or changed because she feel like it is not working. Pt also mentioned she wants to work on her anger problems.

## 2016-11-07 NOTE — BHH Group Notes (Signed)
[place] Group Notes:  (Nursing/MHT/Case Management/Adjunct)  Date:  11/07/2016  Time:  9:58 AM  Type of Therapy:  Psychoeducational Skills  Participation Level:  Active  Participation Quality:  Appropriate  Affect:  Appropriate  Cognitive:  Appropriate  Insight:  Appropriate  Engagement in Group:  Engaged  Modes of Intervention:  Discussion  Summary of Progress/Problems: Pt did attend self inventory group.     Benancio Deeds Shanta 11/07/2016, 9:58 AM

## 2016-11-07 NOTE — BHH Group Notes (Signed)
[place] Group Notes:  (Clinical Social Work)  11/07/2016  11:00AM-12:00PM  Summary of Progress/Problems:  The main focus of today's process group was to listen to a variety of genres of music and to identify that different types of music provoke different responses.  The patient then was able to identify personally what was soothing for them, as well as energizing, as well as how patient can personally use this knowledge in sleep habits, with depression, and with other symptoms.  The patient expressed at the beginning of group the overall feeling of "happy with some depression" at a level of "5" out of 10.  During group, she sang many songs, got to hear several that she requested, and danced a good bit.  At the end of group, she stated she was now no longer depressed, "but it has been switched to anxiety."  Type of Therapy:  Music Therapy   Participation Level:  Active  Participation Quality:  Attentive and Sharing  Affect:  Labile  Cognitive:  Oriented  Insight:  Improving  Engagement in Therapy:  Engaged  Modes of Intervention:   Activity, Exploration  Selmer Dominion, LCSW 11/07/2016

## 2016-11-07 NOTE — Progress Notes (Signed)
Patient has been up and active on the unit. Her husband visited tonight and she was in a happy mood. She reports having had a panic attack today and how she worked through it using her coping skills and medication. She has been writing in her journal and drawing this evening to help ease her anxiety. She was informed of her scheduled medications. Support given and safety maintained on unit with 15 min checks.

## 2016-11-07 NOTE — Plan of Care (Signed)
Problem: Coping: Goal: Ability to verbalize frustrations and anger appropriately will improve Outcome: Progressing Patient is able to verbalize her frustrations.  She is able to verbally vent to staff when she is angry and agitated.

## 2016-11-07 NOTE — Progress Notes (Addendum)
[facility] MD Progress Note  11/07/2016 2:33 PM Suzanne Klein  MRN:  [id number]  Subjective:  Patient reports ongoing depression, anxiety, and reports some chronic PTSD. States she feels she has made some improvement compared to how she felt prior to admission, but states that improvement has been partial and relatively modest .  Objective:  I have reviewed chart notes and have met with patient . 36 year old female, who has been diagnosed with PTSD, Depression in the past . She reports " I think I have multiple personalities as well". At this time she reports partial improvement but remains depressed, sad, anxious. Denies any suicidal or self injurious ideations . Patient denies any medication side effects- at this time in process of tapering off Cymbalta, as she felt it did not work, and has recently been started on Abilify/Paxil, which thus far she has tolerated well . She is also on Neurontin ( x 2 years ) for pain, neuropathy,  and on Minipress for PTSD related nightmares . No disruptive or agitated behaviors on unit    Principal Problem: Major depressive disorder, recurrent severe without psychotic features ([place]) Diagnosis:   Patient Active Problem List   Diagnosis Date Noted  . Major depressive disorder, recurrent severe without psychotic features ([place]) [F33.2] 11/05/2016  . Schizoaffective disorder ([place]) [F25.9] 11/04/2016  . Major depressive disorder, recurrent, severe without psychotic features ([place]) [F33.2]   . 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 Pharyngitis [J02.9] 08/21/2013  . Throat pain [R07.0] 08/21/2013  . Rash [R21] 08/21/2013  . Cervical insufficiency in pregnancy, antepartum [O34.30] 07/06/2013  . Abnormal quad screen [O28.0] 07/06/2013  . PTSD (post-traumatic stress disorder) [F43.10] 06/29/2013  . Anxiety [F41.9] 06/29/2013  . Borderline personality disorder [F60.3] 06/29/2013  . Supervision of other normal pregnancy  [Z34.80] 05/21/2013  . Previous cesarean section [Z98.891] 05/21/2013  . Tobacco smoking complicating pregnancy [W09.811] 05/21/2013   Total Time spent with patient: 20 minutes   Past Psychiatric History:  Past Medical History:  Past Medical History:  Diagnosis Date  . Abnormal Pap smear   . Borderline personality disorder    No meds  . Heartburn in pregnancy   . HPV in female   . HPV in female   . Migraines   . Panic anxiety syndrome    no meds  . PTSD (post-traumatic stress disorder)    No meds  . Scoliosis of lumbar spine   . Termination of pregnancy    x 1    Past Surgical History:  Procedure Laterality Date  . CESAREAN SECTION     x 2  . CESAREAN SECTION  07/21/2012   Procedure: CESAREAN SECTION;  Surgeon: Frederico Hamman, MD;  Location: [facility];  Service: Obstetrics;  Laterality: N/A;  Repeat Cesarean Section Delivery Boy @ [phone], Apgars 9/9  . CESAREAN SECTION    . COLPOSCOPY    . DILATION AND CURETTAGE OF UTERUS    . WISDOM TOOTH EXTRACTION     Family History:  Family History  Problem Relation Age of Onset  . Cancer Mother   . Heart disease Mother   . Dementia Maternal Grandfather   . Anesthesia problems Neg Hx    Family Psychiatric  History: Social History:  History  Alcohol Use  . Yes     History  Drug Use No    Social History   Social History  . Marital status: Married    Spouse name: N/A  . Number of children: N/A  .  Years of education: N/A   Social History Main Topics  . Smoking status: Current Every Day Smoker    Packs/day: 0.15    Years: 13.00    Types: Cigarettes  . Smokeless tobacco: Never Used  . Alcohol use Yes  . Drug use: No  . Sexual activity: Yes    Birth control/ protection: None     Comment: pregnant   Other Topics Concern  . None   Social History Narrative   ** Merged History Encounter **       Additional Social History:   Sleep: Fair  Appetite:  Fair  Current Medications: Current Facility-Administered  Medications  Medication Dose Route Frequency Provider Last Rate Last Dose  . acetaminophen (TYLENOL) tablet 650 mg  650 mg Oral Q6H PRN Patrecia Pour, NP   650 mg at 11/06/16 1106  . alum & mag hydroxide-simeth (MAALOX/MYLANTA) 200-200-20 MG/5ML suspension 30 mL  30 mL Oral Q4H PRN Patrecia Pour, NP   30 mL at 11/06/16 0827  . ARIPiprazole (ABILIFY) tablet 2 mg  2 mg Oral QHS Ursula Alert, MD   2 mg at 11/06/16 2107  . DULoxetine (CYMBALTA) DR capsule 20 mg  20 mg Oral QPM Derrill Center, NP   20 mg at 11/06/16 1659  . gabapentin (NEURONTIN) capsule 600 mg  600 mg Oral TID Patrecia Pour, NP   600 mg at 11/07/16 1153  . hydrOXYzine (ATARAX/VISTARIL) tablet 25 mg  25 mg Oral TID PRN Ursula Alert, MD   25 mg at 11/06/16 1330  . Influenza vac split quadrivalent PF (FLUARIX) injection 0.5 mL  0.5 mL Intramuscular Tomorrow-1000 Rozetta Nunnery, NP      . magnesium hydroxide (MILK OF MAGNESIA) suspension 30 mL  30 mL Oral Daily PRN Patrecia Pour, NP   30 mL at 11/06/16 2003  . PARoxetine (PAXIL) tablet 20 mg  20 mg Oral Daily Ursula Alert, MD   20 mg at 11/07/16 0810  . prazosin (MINIPRESS) capsule 2 mg  2 mg Oral QHS Patrecia Pour, NP   2 mg at 11/06/16 2107  . traZODone (DESYREL) tablet 50 mg  50 mg Oral QHS Ursula Alert, MD   50 mg at 11/06/16 2107    Lab Results: No results found for this or any previous visit (from the past 48 hour(s)).  Blood Alcohol level:  Lab Results  Component Value Date   ETH <5 11/03/2016   ETH <5 62/22/9798    Metabolic Disorder Labs: No results found for: HGBA1C, MPG No results found for: PROLACTIN No results found for: CHOL, TRIG, HDL, CHOLHDL, VLDL, LDLCALC  Physical Findings: AIMS: Facial and Oral Movements Muscles of Facial Expression: None, normal Lips and Perioral Area: None, normal Jaw: None, normal Tongue: None, normal,Extremity Movements Upper (arms, wrists, hands, fingers): None, normal Lower (legs, knees, ankles, toes): None, normal,  Trunk Movements Neck, shoulders, hips: None, normal, Overall Severity Severity of abnormal movements (highest score from questions above): None, normal Incapacitation due to abnormal movements: None, normal Patient's awareness of abnormal movements (rate only patient's report): No Awareness, Dental Status Current problems with teeth and/or dentures?: No Does patient usually wear dentures?: No  CIWA:    COWS:     Musculoskeletal: Strength & Muscle Tone: within normal limits Gait & Station: normal Patient leans: N/A  Psychiatric Specialty Exam: Physical Exam  Nursing note and vitals reviewed. Constitutional: She is oriented to person, place, and time.  Neurological: She is alert and oriented to  person, place, and time.  Psychiatric: She has a normal mood and affect. Her behavior is normal.    ROS denies headache, no chest pain, no shortness of breath, no vomiting, (+) lose stools   Blood pressure 116/66, pulse 87, temperature 98.7 F (37.1 C), temperature source Oral, resp. rate 18, height _0  (1.676 m), weight 95.3 kg (210 lb), last menstrual period 11/03/2016, SpO2 99 %.Body mass index is 33.89 kg/m.  General Appearance: Casual  Eye Contact:  Fair   Speech:  normal  Volume:  Normal  Mood:  Anxious and Depressed  Affect:  Constricted, but reactive  Thought Process:  Linear   Orientation:  Full (Time, Place, and Person)  Thought Content:  Denies hallucinations, no delusions, not internally preoccupied   Suicidal Thoughts:  No denies suicidal ideations, denies any self injurious ideations   Homicidal Thoughts:  No  Memory:  Recent and remote grossly intact   Judgement:  Improving   Insight:  Fair  Psychomotor Activity:  Normal  Concentration:  Concentration: Good and Attention Span: Good  Recall:  Good  Fund of Knowledge:  Good  Language:  Good  Akathisia:  No  Handed:  Right  AIMS (if indicated):     Assets:  Desire for Improvement Intimacy Talents/Skills  ADL's:   Intact  Cognition:  WNL  Sleep:  Number of Hours: 6.25   Assessment - patient reports slight improvement since admission . At this time denies suicidal ideations. Reports ongoing depression, sadness, and vague but persistent anxiety. States she has different personalities who " talk to her", but at this time denies hallucinations, and do not appear internally preoccupied. She is tolerating medications well, currently in the process of tapering off Cymbalta and recently started on Abilify/Paxil  Reports history of GERD, requests PPI for symptoms, states they have been well tolerated in the past    Treatment Plan Summary: Daily contact with patient to assess and evaluate symptoms and progress in treatment and Medication management   Encourage group and milieu participation to work on coping skills and symptom reduction Treatment team working on disposition planning     For Depressive /affective sx: Cross titrate Paxil with Cymbalta.  D/C Cymbalta tomorrow Contiune Paxil 20 mg QDAY   For Psychosis: Continue Abilify 2 mg QHS    For Insomnia: Trazodone 50 mg QHS  For nightmares: Prazosin 2 mg QHS   For anxiety sx; Neurontin 600 mg TID  For GERD: Start Protonix 40 mgrs Hubert Azure, MD 11/07/2016, 2:33 PM   Patient ID: Suzanne Klein, female   DOB: October 01, 1981, 36 y.o.   MRN: [id number]

## 2016-11-08 DIAGNOSIS — Z79899 Other long term (current) drug therapy: Secondary | ICD-10-CM

## 2016-11-08 DIAGNOSIS — F332 Major depressive disorder, recurrent severe without psychotic features: Secondary | ICD-10-CM

## 2016-11-08 DIAGNOSIS — F603 Borderline personality disorder: Secondary | ICD-10-CM

## 2016-11-08 DIAGNOSIS — F17210 Nicotine dependence, cigarettes, uncomplicated: Secondary | ICD-10-CM

## 2016-11-08 DIAGNOSIS — Z9889 Other specified postprocedural states: Secondary | ICD-10-CM

## 2016-11-08 DIAGNOSIS — F4310 Post-traumatic stress disorder, unspecified: Secondary | ICD-10-CM

## 2016-11-08 DIAGNOSIS — Z808 Family history of malignant neoplasm of other organs or systems: Secondary | ICD-10-CM

## 2016-11-08 DIAGNOSIS — Z8249 Family history of ischemic heart disease and other diseases of the circulatory system: Secondary | ICD-10-CM

## 2016-11-08 LAB — LIPID PANEL
Cholesterol: 167 mg/dL (ref 0–200)
HDL: 37 mg/dL — ABNORMAL LOW (ref >40)
LDL CALC: 98 mg/dL (ref 0–99)
TRIGLYCERIDES: 162 mg/dL — AB (ref <150)
Total CHOL/HDL Ratio: 4.5 RATIO
VLDL: 32 mg/dL (ref 0–40)

## 2016-11-08 LAB — TSH: TSH: 5.549 u[IU]/mL — ABNORMAL HIGH (ref 0.350–4.500)

## 2016-11-08 MED ORDER — NICOTINE 21 MG/24HR TD PT24
21.0000 mg | MEDICATED_PATCH | Freq: Every day | TRANSDERMAL | Status: DC
  Administered 2016-11-08 – 2016-11-12 (×5): 21 mg via TRANSDERMAL
  Filled 2016-11-08 (×8): qty 1

## 2016-11-08 MED ORDER — GABAPENTIN 400 MG PO CAPS: 800.0000 mg | ORAL_CAPSULE | Freq: Three times a day (TID) | ORAL | Status: DC

## 2016-11-08 MED ORDER — TRAZODONE HCL 150 MG PO TABS
75.0000 mg | ORAL_TABLET | Freq: Every day | ORAL | Status: DC
  Administered 2016-11-08 – 2016-11-12 (×5): 75 mg via ORAL
  Filled 2016-11-08: qty 1
  Filled 2016-11-08 (×4): qty 0.5
  Filled 2016-11-08 (×2): qty 1
  Filled 2016-11-08 (×2): qty 0.5

## 2016-11-08 MED ORDER — GABAPENTIN 300 MG PO CAPS
600.0000 mg | ORAL_CAPSULE | Freq: Three times a day (TID) | ORAL | Status: DC
  Administered 2016-11-08 – 2016-11-09 (×3): 600 mg via ORAL
  Filled 2016-11-08 (×8): qty 2

## 2016-11-08 MED ORDER — ONDANSETRON 4 MG PO TBDP
4.0000 mg | ORAL_TABLET | Freq: Three times a day (TID) | ORAL | Status: DC | PRN
  Administered 2016-11-08: 4 mg via ORAL
  Filled 2016-11-08: qty 1

## 2016-11-08 NOTE — Plan of Care (Signed)
Problem: Education: Goal: Knowledge of the prescribed therapeutic regimen will improve Outcome: Progressing Nurse discussed depression/coping skills with patient.        

## 2016-11-08 NOTE — Progress Notes (Signed)
D:  Patient's self inventory sheet, patient has fair sleep, sleep medication helpful.  Poor appetite, low energy level, poor concentration.  Rated depression, hopeless and anxiety 9.  Denied withdrawals.  Has experienced diarrhea in past 24 hrs.  Denied SI.  Has experienced pain, headaches in past 24 hours.  Pain ankles, knees, back, worst pain #7 in past 24 hours.  Goal is to get meds right.  Plans to talk to MD.  No discharge plans. A:  Medications administered per MD orders.  Emotional support and encouragement given patient. R:  Patient denied SI, contracts for safety.  HI to exhusband.  Stated she always hears voices, voices are like her sister.  Voices usually tell her you can do it.  Husband likes all of her personalities..  Stated she has racing thoughts,

## 2016-11-08 NOTE — Progress Notes (Signed)
Pt in dayroom during group.  Pt sts after that she misses [address] because she made "some connections" and has not had anyone to talk to on [address].  Pt denies pain or discomfort.  Pt denies SI, HI and AVH.  Pt agrees to contract for safety, verbally. Pt took all meds ordered and returned to her room. Pt observed q 15 min for safety.  Pt remains safe on unit.

## 2016-11-08 NOTE — [facility] Group Notes (Signed)
[facility] LCSW Group Therapy  11/08/2016 1:15pm  Type of Therapy:  Group Therapy vercoming Obstacles  Participation Level:  Active  Participation Quality:  Negatively-Oriented  Affect:  Flat  Cognitive:  Appropriate and Oriented  Insight:  Minimal  Engagement in Therapy:  Improving  Modes of Intervention:  Discussion, Exploration, Problem-solving and Support  Description of Group:   In this group patients will be encouraged to explore what they see as obstacles to their own wellness and recovery. They will be guided to discuss their thoughts, feelings, and behaviors related to these obstacles. The group will process together ways to cope with barriers, with attention given to specific choices patients can make. Each patient will be challenged to identify changes they are motivated to make in order to overcome their obstacles. This group will be process-oriented, with patients participating in exploration of their own experiences as well as giving and receiving support and challenge from other group members.   Therapeutic Modalities:   Cognitive Behavioral Therapy Solution Focused Therapy Motivational Interviewing Relapse Prevention Therapy   Adriana Reams, LCSW 11/08/2016 6:06 PM

## 2016-11-08 NOTE — Progress Notes (Signed)
Recreation Therapy Notes  Date: 11/08/16 Time: 0930 Location: [address] Dayroom  Group Topic: Stress Management  Goal Area(s) Addresses:  Patient will verbalize importance of using healthy stress management.  Patient will identify positive emotions associated with healthy stress management.   Intervention: Stress Management  Activity :  Peaceful Waves Guided Imagery.  LRT introduced the stress management concept of guided imagery.  LRT read a script to allow patients the opportunity engage and participate in the activity.  Patients were to follow along as LRT read script to participate in the activity.  Education:  Stress Management, Discharge Planning.   Education Outcome: Acknowledges edcuation/In group clarification offered/Needs additional education  Clinical Observations/Feedback: Pt did not attend group.   Victorino Sparrow, LRT/CTRS         Victorino Sparrow A 11/08/2016 12:20 PM

## 2016-11-08 NOTE — Progress Notes (Signed)
[facility] MD Progress Note  11/08/2016 2:22 PM Suzanne Klein  MRN:  [id number]  Subjective:  Suzanne Klein reports, "I'm not not feeling too good today. I feel angry, irritated, hopeless, my mind is racing. My medicines seem like they are not working. I feel like just fucking go home. I feel like putting my hand through the wall right now. Everything gets to me. I just want to cry. My multiple personalities keep popping in & out. I don't even know who I am right now"  Objective:  I have reviewed chart notes and have met with patient . 36 year old female, who has been diagnosed with PTSD, Depression in the past . She reports " I think I have multiple personalities as well". At this time she reports no improvement, remains depressed, sad, anxious, irritated, agitated & angry. Denies any suicidal ideation but reports feelings of self injurious ideations like putting her through the wall. Patient denies any medication side effects- at this time in process of tapering off Cymbalta, as she felt it did not work, and has recently been started on Abilify/Paxil, which thus far she has tolerated well . She is also on Neurontin ( x 2 years ) for pain, neuropathy and on Minipress for PTSD related nightmares. Neurontin is increased to 600 mg qid & Trazodone increased to 75 mg. She is participating in group sessions. No disruptive or agitated behaviors on unit. Support & encouragement provided.  Principal Problem: Major depressive disorder, recurrent severe without psychotic features ([place])  Diagnosis:   Patient Active Problem List   Diagnosis Date Noted  . Major depressive disorder, recurrent severe without psychotic features ([place]) [F33.2] 11/05/2016  . Schizoaffective disorder ([place]) [F25.9] 11/04/2016  . Major depressive disorder, recurrent, severe without psychotic features ([place]) [F33.2]   . 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 Pharyngitis [J02.9] 08/21/2013  . Throat  pain [R07.0] 08/21/2013  . Rash [R21] 08/21/2013  . Cervical insufficiency in pregnancy, antepartum [O34.30] 07/06/2013  . Abnormal quad screen [O28.0] 07/06/2013  . PTSD (post-traumatic stress disorder) [F43.10] 06/29/2013  . Anxiety [F41.9] 06/29/2013  . Borderline personality disorder [F60.3] 06/29/2013  . Supervision of other normal pregnancy [Z34.80] 05/21/2013  . Previous cesarean section [Z98.891] 05/21/2013  . Tobacco smoking complicating pregnancy [R97.588] 05/21/2013   Total Time spent with patient: 15 minutes   Past Psychiatric History: PTSD, MDD  Past Medical History:  Past Medical History:  Diagnosis Date  . Abnormal Pap smear   . Borderline personality disorder    No meds  . Heartburn in pregnancy   . HPV in female   . HPV in female   . Migraines   . Panic anxiety syndrome    no meds  . PTSD (post-traumatic stress disorder)    No meds  . Scoliosis of lumbar spine   . Termination of pregnancy    x 1    Past Surgical History:  Procedure Laterality Date  . CESAREAN SECTION     x 2  . CESAREAN SECTION  07/21/2012   Procedure: CESAREAN SECTION;  Surgeon: Frederico Hamman, MD;  Location: [place];  Service: Obstetrics;  Laterality: N/A;  Repeat Cesarean Section Delivery Boy @ [phone], Apgars 9/9  . CESAREAN SECTION    . COLPOSCOPY    . DILATION AND CURETTAGE OF UTERUS    . WISDOM TOOTH EXTRACTION     Family History:  Family History  Problem Relation Age of Onset  . Cancer Mother   . Heart disease  Mother   . Dementia Maternal Grandfather   . Anesthesia problems Neg Hx    Family Psychiatric  History: See H&P  Social History:  History  Alcohol Use  . Yes     History  Drug Use No    Social History   Social History  . Marital status: Married    Spouse name: N/A  . Number of children: N/A  . Years of education: N/A   Social History Main Topics  . Smoking status: Current Every Day Smoker    Packs/day: 0.15    Years: 13.00    Types: Cigarettes  .  Smokeless tobacco: Never Used  . Alcohol use Yes  . Drug use: No  . Sexual activity: Yes    Birth control/ protection: None     Comment: pregnant   Other Topics Concern  . None   Social History Narrative   ** Merged History Encounter **       Additional Social History:   Sleep: Fair  Appetite:  Fair  Current Medications: Current Facility-Administered Medications  Medication Dose Route Frequency Provider Last Rate Last Dose  . acetaminophen (TYLENOL) tablet 650 mg  650 mg Oral Q6H PRN Patrecia Pour, NP   650 mg at 11/06/16 1106  . alum & mag hydroxide-simeth (MAALOX/MYLANTA) 200-200-20 MG/5ML suspension 30 mL  30 mL Oral Q4H PRN Patrecia Pour, NP   30 mL at 11/07/16 1454  . ARIPiprazole (ABILIFY) tablet 2 mg  2 mg Oral QHS Ursula Alert, MD   2 mg at 11/07/16 2124  . gabapentin (NEURONTIN) capsule 600 mg  600 mg Oral TID PC & HS Encarnacion Slates, NP      . hydrOXYzine (ATARAX/VISTARIL) tablet 25 mg  25 mg Oral TID PRN Ursula Alert, MD   25 mg at 11/08/16 0805  . Influenza vac split quadrivalent PF (FLUARIX) injection 0.5 mL  0.5 mL Intramuscular Tomorrow-1000 Rozetta Nunnery, NP      . magnesium hydroxide (MILK OF MAGNESIA) suspension 30 mL  30 mL Oral Daily PRN Patrecia Pour, NP   30 mL at 11/08/16 1042  . ondansetron (ZOFRAN-ODT) disintegrating tablet 4 mg  4 mg Oral Q8H PRN Derrill Center, NP   4 mg at 11/08/16 1306  . pantoprazole (PROTONIX) EC tablet 40 mg  40 mg Oral Daily Jenne Campus, MD   40 mg at 11/08/16 0758  . PARoxetine (PAXIL) tablet 20 mg  20 mg Oral Daily Ursula Alert, MD   20 mg at 11/08/16 0758  . prazosin (MINIPRESS) capsule 2 mg  2 mg Oral QHS Patrecia Pour, NP   2 mg at 11/07/16 2124  . traZODone (DESYREL) tablet 75 mg  75 mg Oral QHS Jenne Campus, MD       Lab Results:  Results for orders placed or performed during the hospital encounter of 11/05/16 (from the past 48 hour(s))  TSH     Status: Abnormal   Collection Time: 11/08/16  6:28 AM   Result Value Ref Range   TSH 5.549 (H) 0.350 - 4.500 uIU/mL    Comment: Performed by a 3rd Generation assay with a functional sensitivity of <=0.01 uIU/mL. Performed at [facility]   Lipid panel     Status: Abnormal   Collection Time: 11/08/16  6:28 AM  Result Value Ref Range   Cholesterol 167 0 - 200 mg/dL   Triglycerides 162 (H) <150 mg/dL   HDL 37 (L) >40 mg/dL  Total CHOL/HDL Ratio 4.5 RATIO   VLDL 32 0 - 40 mg/dL   LDL Cholesterol 98 0 - 99 mg/dL    Comment:        Total Cholesterol/HDL:CHD Risk Coronary Heart Disease Risk Table                     Men   Women  1/2 Average Risk   3.4   3.3  Average Risk       5.0   4.4  2 X Average Risk   9.6   7.1  3 X Average Risk  23.4   11.0        Use the calculated Patient Ratio above and the CHD Risk Table to determine the patient's CHD Risk.        ATP III CLASSIFICATION (LDL):  <100     mg/dL   Optimal  100-129  mg/dL   Near or Above                    Optimal  130-159  mg/dL   Borderline  160-189  mg/dL   High  >190     mg/dL   Very High Performed at [facility]    Blood Alcohol level:  Lab Results  Component Value Date   [facility] <5 11/03/2016   ETH <5 52/77/8242   Metabolic Disorder Labs: No results found for: HGBA1C, MPG No results found for: PROLACTIN Lab Results  Component Value Date   CHOL 167 11/08/2016   TRIG 162 (H) 11/08/2016   HDL 37 (L) 11/08/2016   CHOLHDL 4.5 11/08/2016   VLDL 32 11/08/2016   LDLCALC 98 11/08/2016   Physical Findings: AIMS: Facial and Oral Movements Muscles of Facial Expression: None, normal Lips and Perioral Area: None, normal Jaw: None, normal Tongue: None, normal,Extremity Movements Upper (arms, wrists, hands, fingers): None, normal Lower (legs, knees, ankles, toes): None, normal, Trunk Movements Neck, shoulders, hips: None, normal, Overall Severity Severity of abnormal movements (highest score from questions above): None, normal Incapacitation  due to abnormal movements: None, normal Patient's awareness of abnormal movements (rate only patient's report): No Awareness, Dental Status Current problems with teeth and/or dentures?: No Does patient usually wear dentures?: No  CIWA:  CIWA-Ar Total: 1 COWS:  COWS Total Score: 2  Musculoskeletal: Strength & Muscle Tone: within normal limits Gait & Station: normal Patient leans: N/A  Psychiatric Specialty Exam: Physical Exam  Nursing note and vitals reviewed. Constitutional: She is oriented to person, place, and time.  Neurological: She is alert and oriented to person, place, and time.  Psychiatric: She has a normal mood and affect. Her behavior is normal.    ROS denies headache, no chest pain, no shortness of breath, no vomiting, (+) lose stools   Blood pressure 114/69, pulse 85, temperature 97.6 F (36.4 C), temperature source Oral, resp. rate 20, height _0  (1.676 m), weight 95.3 kg (210 lb), last menstrual period 11/03/2016, SpO2 99 %.Body mass index is 33.89 kg/m.  General Appearance: Casual  Eye Contact:  Fair   Speech:  normal  Volume:  Normal  Mood:  Anxious and Depressed  Affect:  Constricted, but reactive  Thought Process:  Linear   Orientation:  Full (Time, Place, and Person)  Thought Content:  Denies hallucinations, no delusions, not internally preoccupied   Suicidal Thoughts:  No denies suicidal ideations, denies any self injurious ideations   Homicidal Thoughts:  No  Memory:  Recent and remote grossly  intact   Judgement:  Improving   Insight:  Fair  Psychomotor Activity:  Normal  Concentration:  Concentration: Good and Attention Span: Good  Recall:  Good  Fund of Knowledge:  Good  Language:  Good  Akathisia:  No  Handed:  Right  AIMS (if indicated):     Assets:  Desire for Improvement Intimacy Talents/Skills  ADL's:  Intact  Cognition:  WNL  Sleep:  Number of Hours: 5.75   Assessment - patient reports slight improvement since admission . At this  time denies suicidal ideations. Reports ongoing depression, sadness, and vague but persistent anxiety. States she has different personalities who " talk to her", but at this time denies hallucinations, and do not appear internally preoccupied. She is tolerating medications well, currently in the process of tapering off Cymbalta and recently started on Abilify/Paxil  Reports history of GERD, requests PPI for symptoms, states they have been well tolerated in the past   Treatment Plan Summary: Daily contact with patient to assess and evaluate symptoms and progress in treatment and Medication management Encourage group and milieu participation to work on coping skills and symptom reduction Treatment team working on disposition planning    For Depressive /affective sx:  Will Contiune Paxil 20 mg QDAY   For Psychosis: Will Continue Abilify 2 mg QHS   For Insomnia: Increased Trazodone to 75 mg QHS  For nightmares: Prazosin 2 mg QHS  For anxiety/agitation; Increased Neurontin to 600 mg QID.  For GERD: Will continue Protonix 40 mgrs QDAY   Encarnacion Slates, NP 11/08/2016, 2:22 PM  Patient ID: Suzanne Klein, female   DOB: [birth year], 36 y.o.   MRN: [id number] Agree with NP Progress Note

## 2016-11-08 NOTE — Progress Notes (Addendum)
Patient stated she is not feeling any better.  The depression medication is not working, did not sleep well last last.  Will discuss medications with MD.   She feels like giving up and going home.  Everytime she goes out of the house, she sees her exhusband.  Has restraining order on exhusband but it does not do any good. Misses her 4 children, 2 youngest children are by her second exhusband who are adopted out by [employer] and she does not know where they are.  Does not feel like anything is helping her, not the groups, not the medicines.  Feels hopeless and at her wit's end right now. Has decreased appetite.  Feels nauseated.  Wants to stop her racing thoughts.

## 2016-11-08 NOTE — Progress Notes (Signed)
Patient did not attend wrap-up group was sleeping.
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
## 2016-11-08 NOTE — Progress Notes (Signed)
D: Pt denies SI/HI/AVH. Pt is pleasant and cooperative. Pt goal for today is to review her medications with her doctor. A: Pt was offered support and encouragement. Pt was given scheduled medications. Pt was encourage to attend groups. Q 15 minute checks were done for safety.  R:Pt attends groups and interacts well with peers and staff. Pt is taking medication. Pt has no complaints.Pt receptive to treatment and safety maintained on unit.

## 2016-11-09 LAB — PROLACTIN: Prolactin: 21.8 ng/mL (ref 4.8–23.3)

## 2016-11-09 LAB — HEMOGLOBIN A1C
HEMOGLOBIN A1C: 5.7 % — AB (ref 4.8–5.6)
Mean Plasma Glucose: 117 mg/dL

## 2016-11-09 MED ORDER — HYDROXYZINE HCL 50 MG PO TABS
50.0000 mg | ORAL_TABLET | Freq: Once | ORAL | Status: AC
  Administered 2016-11-09: 50 mg via ORAL
  Filled 2016-11-09 (×2): qty 1

## 2016-11-09 MED ORDER — GABAPENTIN 400 MG PO CAPS
800.0000 mg | ORAL_CAPSULE | Freq: Three times a day (TID) | ORAL | Status: DC
  Administered 2016-11-09 – 2016-11-13 (×13): 800 mg via ORAL
  Filled 2016-11-09 (×18): qty 2

## 2016-11-09 MED ORDER — LIDOCAINE 5 % EX PTCH
1.0000 | MEDICATED_PATCH | CUTANEOUS | Status: DC
  Administered 2016-11-09 – 2016-11-13 (×5): 1 via TRANSDERMAL
  Filled 2016-11-09 (×6): qty 1

## 2016-11-09 NOTE — Plan of Care (Signed)
Problem: Education: Goal: Utilization of techniques to improve thought processes will improve Outcome: Not Progressing Nurses have discussed depression/anxiety/coping skills with patient.

## 2016-11-09 NOTE — Progress Notes (Addendum)
Patient stated she has been taking neurotin 600 mg tid.  Patient stated she needs to stay on her previous medication schedule but needs a higher dosage.  Patient stated she did not sleep well last night, was able to get to sleep but it was a hard sleep.  Patient stated she needs to discuss her medication needs with medical staff.   Patient stated her depression medication is not working because she has been crying in her bed asleep for the past 3 days and has been isolating herself.  Stated she feels alone and helpless and feels worst now than when she came in.  Patient stated she is feeling angry and frustrated to the point that she may starting throwing stuff to get staff's attention.  RNs have informed patient that throwing things on the unit is not a good thing for her or the other patients.  Patient agrees to come to staff and discuss her frustrations/concerns with staff to avoid further actions.  Patient really wants to be discharged and is working toward the goal of being discharged favorably from [facility].

## 2016-11-09 NOTE — Progress Notes (Signed)
[facility] MD Progress Note  11/09/2016 2:58 PM Suzanne Klein  MRN:  [id number]  Subjective:  Suzanne Klein reports that she is feeling better.  Asked if she could get medicine for ADHD.  Objective:  I have reviewed chart notes and have met with patient . diagnosed with PTSD, Depression in the past .  At this time she reports no c/o.  Does get irritable and angry at times.  She states that she is going home in the morning.  Denies any suicidal ideation but reports feelings of self injurious ideations like putting her through the wall. Patient denies any medication side effects.  States she feels improvement with  Abilify/Paxil, which thus far she has tolerated well . She is also on Neurontin (x 2 years ) for pain, neuropathy and on Minipress for PTSD related nightmares. She is participating in group sessions. No disruptive or agitated behaviors on unit. Support & encouragement provided.  Principal Problem: Major depressive disorder, recurrent severe without psychotic features ([place])  Diagnosis:   Patient Active Problem List   Diagnosis Date Noted  . Major depressive disorder, recurrent severe without psychotic features ([place]) [F33.2] 11/05/2016  . Schizoaffective disorder ([place]) [F25.9] 11/04/2016  . Major depressive disorder, recurrent, severe without psychotic features ([place]) [F33.2]   . 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 Pharyngitis [J02.9] 08/21/2013  . Throat pain [R07.0] 08/21/2013  . Rash [R21] 08/21/2013  . Cervical insufficiency in pregnancy, antepartum [O34.30] 07/06/2013  . Abnormal quad screen [O28.0] 07/06/2013  . PTSD (post-traumatic stress disorder) [F43.10] 06/29/2013  . Anxiety [F41.9] 06/29/2013  . Borderline personality disorder [F60.3] 06/29/2013  . Supervision of other normal pregnancy [Z34.80] 05/21/2013  . Previous cesarean section [Z98.891] 05/21/2013  . Tobacco smoking complicating pregnancy [S96.283] 05/21/2013   Total Time  spent with patient: 15 minutes   Past Psychiatric History: PTSD, MDD  Past Medical History:  Past Medical History:  Diagnosis Date  . Abnormal Pap smear   . Borderline personality disorder    No meds  . Heartburn in pregnancy   . HPV in female   . HPV in female   . Migraines   . Panic anxiety syndrome    no meds  . PTSD (post-traumatic stress disorder)    No meds  . Scoliosis of lumbar spine   . Termination of pregnancy    x 1    Past Surgical History:  Procedure Laterality Date  . CESAREAN SECTION     x 2  . CESAREAN SECTION  07/21/2012   Procedure: CESAREAN SECTION;  Surgeon: Frederico Hamman, MD;  Location: [facility];  Service: Obstetrics;  Laterality: N/A;  Repeat Cesarean Section Delivery Boy @ [id number], Apgars 9/9  . CESAREAN SECTION    . COLPOSCOPY    . DILATION AND CURETTAGE OF UTERUS    . WISDOM TOOTH EXTRACTION     Family History:  Family History  Problem Relation Age of Onset  . Cancer Mother   . Heart disease Mother   . Dementia Maternal Grandfather   . Anesthesia problems Neg Hx    Family Psychiatric  History: See H&P  Social History:  History  Alcohol Use  . Yes     History  Drug Use No    Social History   Social History  . Marital status: Married    Spouse name: N/A  . Number of children: N/A  . Years of education: N/A   Social History Main Topics  . Smoking status: Current Every Day  Smoker    Packs/day: 0.15    Years: 13.00    Types: Cigarettes  . Smokeless tobacco: Never Used  . Alcohol use Yes  . Drug use: No  . Sexual activity: Yes    Birth control/ protection: None     Comment: pregnant   Other Topics Concern  . None   Social History Narrative   ** Merged History Encounter **       Additional Social History:   Sleep: Fair  Appetite:  Fair  Current Medications: Current Facility-Administered Medications  Medication Dose Route Frequency Provider Last Rate Last Dose  . acetaminophen (TYLENOL) tablet 650 mg  650 mg Oral  Q6H PRN Patrecia Pour, NP   650 mg at 11/09/16 0800  . alum & mag hydroxide-simeth (MAALOX/MYLANTA) 200-200-20 MG/5ML suspension 30 mL  30 mL Oral Q4H PRN Patrecia Pour, NP   30 mL at 11/08/16 1529  . ARIPiprazole (ABILIFY) tablet 2 mg  2 mg Oral QHS Ursula Alert, MD   2 mg at 11/08/16 2120  . gabapentin (NEURONTIN) capsule 800 mg  800 mg Oral TID Jenne Campus, MD   800 mg at 11/09/16 1253  . hydrOXYzine (ATARAX/VISTARIL) tablet 25 mg  25 mg Oral TID PRN Ursula Alert, MD   25 mg at 11/09/16 0801  . Influenza vac split quadrivalent PF (FLUARIX) injection 0.5 mL  0.5 mL Intramuscular Tomorrow-1000 Rozetta Nunnery, NP      . lidocaine (LIDODERM) 5 % 1 patch  1 patch Transdermal Q24H Jenne Campus, MD   1 patch at 11/09/16 1253  . magnesium hydroxide (MILK OF MAGNESIA) suspension 30 mL  30 mL Oral Daily PRN Patrecia Pour, NP   30 mL at 11/08/16 1042  . nicotine (NICODERM CQ - dosed in mg/24 hours) patch 21 mg  21 mg Transdermal Daily Jenne Campus, MD   21 mg at 11/09/16 0756  . ondansetron (ZOFRAN-ODT) disintegrating tablet 4 mg  4 mg Oral Q8H PRN Derrill Center, NP   4 mg at 11/08/16 1306  . pantoprazole (PROTONIX) EC tablet 40 mg  40 mg Oral Daily Jenne Campus, MD   40 mg at 11/09/16 0757  . PARoxetine (PAXIL) tablet 20 mg  20 mg Oral Daily Ursula Alert, MD   20 mg at 11/09/16 0757  . prazosin (MINIPRESS) capsule 2 mg  2 mg Oral QHS Patrecia Pour, NP   2 mg at 11/08/16 2120  . traZODone (DESYREL) tablet 75 mg  75 mg Oral QHS Jenne Campus, MD   75 mg at 11/08/16 2121   Lab Results:  Results for orders placed or performed during the hospital encounter of 11/05/16 (from the past 48 hour(s))  TSH     Status: Abnormal   Collection Time: 11/08/16  6:28 AM  Result Value Ref Range   TSH 5.549 (H) 0.350 - 4.500 uIU/mL    Comment: Performed by a 3rd Generation assay with a functional sensitivity of <=0.01 uIU/mL. Performed at [facility]   Hemoglobin A1c      Status: Abnormal   Collection Time: 11/08/16  6:28 AM  Result Value Ref Range   Hgb A1c MFr Bld 5.7 (H) 4.8 - 5.6 %    Comment: (NOTE)         Pre-diabetes: 5.7 - 6.4         Diabetes: >6.4         Glycemic control for adults with diabetes: <7.0  Mean Plasma Glucose 117 mg/dL    Comment: (NOTE) Performed At: [facility] [place], [place] [id number] Lindon Romp MD [id number] Performed at [facility]   Prolactin     Status: None   Collection Time: 11/08/16  6:28 AM  Result Value Ref Range   Prolactin 21.8 4.8 - 23.3 ng/mL    Comment: (NOTE) Performed At: [facility] [place], [place] [id number] Lindon Romp MD [id number] Performed at [facility]   Lipid panel     Status: Abnormal   Collection Time: 11/08/16  6:28 AM  Result Value Ref Range   Cholesterol 167 0 - 200 mg/dL   Triglycerides 162 (H) <150 mg/dL   HDL 37 (L) >40 mg/dL   Total CHOL/HDL Ratio 4.5 RATIO   VLDL 32 0 - 40 mg/dL   LDL Cholesterol 98 0 - 99 mg/dL    Comment:        Total Cholesterol/HDL:CHD Risk Coronary Heart Disease Risk Table                     Men   Women  1/2 Average Risk   3.4   3.3  Average Risk       5.0   4.4  2 X Average Risk   9.6   7.1  3 X Average Risk  23.4   11.0        Use the calculated Patient Ratio above and the CHD Risk Table to determine the patient's CHD Risk.        ATP III CLASSIFICATION (LDL):  <100     mg/dL   Optimal  100-129  mg/dL   Near or Above                    Optimal  130-159  mg/dL   Borderline  160-189  mg/dL   High  >190     mg/dL   Very High Performed at [facility]    Blood Alcohol level:  Lab Results  Component Value Date   [facility] <5 11/03/2016   ETH <5 92/42/6834   Metabolic Disorder Labs: Lab Results  Component Value Date   HGBA1C 5.7 (H) 11/08/2016   MPG 117 11/08/2016   Lab Results  Component Value Date   PROLACTIN 21.8  11/08/2016   Lab Results  Component Value Date   CHOL 167 11/08/2016   TRIG 162 (H) 11/08/2016   HDL 37 (L) 11/08/2016   CHOLHDL 4.5 11/08/2016   VLDL 32 11/08/2016   LDLCALC 98 11/08/2016   Physical Findings: AIMS: Facial and Oral Movements Muscles of Facial Expression: None, normal Lips and Perioral Area: None, normal Jaw: None, normal Tongue: None, normal,Extremity Movements Upper (arms, wrists, hands, fingers): None, normal Lower (legs, knees, ankles, toes): None, normal, Trunk Movements Neck, shoulders, hips: None, normal, Overall Severity Severity of abnormal movements (highest score from questions above): None, normal Incapacitation due to abnormal movements: None, normal Patient's awareness of abnormal movements (rate only patient's report): No Awareness, Dental Status Current problems with teeth and/or dentures?: No Does patient usually wear dentures?: No  CIWA:  CIWA-Ar Total: 3 COWS:  COWS Total Score: 2  Musculoskeletal: Strength & Muscle Tone: within normal limits Gait & Station: normal Patient leans: N/A  Psychiatric Specialty Exam: Physical Exam  Nursing note and vitals reviewed. Constitutional: She is oriented to person, place, and time.  Neurological: She is alert and oriented to  person, place, and time.  Psychiatric: She has a normal mood and affect. Her behavior is normal.    ROS denies headache, no chest pain, no shortness of breath, no vomiting, (+) lose stools   Blood pressure 116/83, pulse 77, temperature 97.6 F (36.4 C), temperature source Oral, resp. rate 16, height 5' 6" (1.676 m), weight 95.3 kg (210 lb), last menstrual period 11/03/2016, SpO2 99 %.Body mass index is 33.89 kg/m.  General Appearance: Casual  Eye Contact:  Fair   Speech:  normal  Volume:  Normal  Mood:  Anxious and Depressed  Affect:  Constricted, but reactive  Thought Process:  Linear   Orientation:  Full (Time, Place, and Person)  Thought Content:  Denies hallucinations,  no delusions, not internally preoccupied   Suicidal Thoughts:  No denies suicidal ideations, denies any self injurious ideations   Homicidal Thoughts:  No  Memory:  Recent and remote grossly intact   Judgement:  Improving   Insight:  Fair  Psychomotor Activity:  Normal  Concentration:  Concentration: Good and Attention Span: Good  Recall:  Good  Fund of Knowledge:  Good  Language:  Good  Akathisia:  No  Handed:  Right  AIMS (if indicated):     Assets:  Desire for Improvement Intimacy Talents/Skills  ADL's:  Intact  Cognition:  WNL  Sleep:  Number of Hours: 6.75   Assessment - patient reports slight improvement since admission . At this time denies suicidal ideations. Reports ongoing depression, sadness, and vague but persistent anxiety. States she has different personalities who " talk to her", but at this time denies hallucinations, and do not appear internally preoccupied. She is tolerating medications well, currently in the process of tapering off Cymbalta and recently started on Abilify/Paxil  Reports history of GERD, requests PPI for symptoms, states they have been well tolerated in the past   Treatment Plan Summary: Daily contact with patient to assess and evaluate symptoms and progress in treatment and Medication management Encourage group and milieu participation to work on coping skills and symptom reduction Treatment team working on disposition planning    For Depressive /affective sx:  Will Contiune Paxil 20 mg QDAY   For Psychosis: Will Continue Abilify 2 mg QHS   For Insomnia: Increased Trazodone to 75 mg QHS  For nightmares: Prazosin 2 mg QHS  For anxiety/agitation; Increased Neurontin to 600 mg QID.  For GERD: Will continue Protonix 40 mgrs QDAY   Janett Labella, NP 11/09/2016, 2:58 PM  Patient ID: Suzanne Klein, female   DOB: 1981/08/06, 36 y.o.   MRN: [id number] Agree with NP Progress Note

## 2016-11-09 NOTE — Progress Notes (Signed)
Recreation Therapy Notes  Animal-Assisted Activity (AAA) Program Checklist/Progress Notes Patient Eligibility Criteria Checklist & Daily Group note for Rec TxIntervention  Date: 01.16.2018 Time: 2:45pm Location: [address] Dayroom    AAA/T Program Assumption of Risk Form signed by Patient/ or Parent Legal Guardian Yes  Patient is free of allergies or sever asthma Yes  Patient reports no fear of animals Yes  Patient reports no history of cruelty to animals Yes  Patient understands his/her participation is voluntary Yes  Patient washes hands before animal contact Yes  Patient washes hands after animal contact Yes  Behavioral Response: Engaged, Appropriate   Education:Hand Washing, Appropriate Animal Interaction   Education Outcome: Acknowledges education.   Clinical Observations/Feedback: Patient attended session and interacted appropriately with therapy dog and peers.  Karly Pitter L Wakisha Alberts, LRT/CTRS        Shajuan Musso L 11/09/2016 3:11 PM 

## 2016-11-09 NOTE — [facility] Group Notes (Signed)

## 2016-11-09 NOTE — Progress Notes (Signed)
Adult Psychoeducational Group Note  Date:  11/09/2016 Time:  10:05 PM  Group Topic/Focus:  Wrap-Up Group:   The focus of this group is to help patients review their daily goal of treatment and discuss progress on daily workbooks.   Participation Level:  Active  Participation Quality:  Appropriate  Affect:  Appropriate  Cognitive:  Alert  Insight: Appropriate  Engagement in Group:  Engaged  Modes of Intervention:  Discussion  Additional Comments:  Patient states, "my day was good". Patient's goal for today was to have a better day then yesterday.  Suzanne Klein L Puanani Gene 11/09/2016, 10:05 PM

## 2016-11-09 NOTE — Progress Notes (Signed)
D:  Patient's self inventory sheet, patient has fair sleep, sleep medication marked good/not good.  Poor appetite, low energy level, poor concentration.  Rated depression, hopeless and anxiety 10.  Denied withdrawals.  Denied SI.  Physical problems, pain, worst pain #8 in past 24 hours, back and feet.  No pain medication.  Goal is to talk to MD.  No plans.  No discharge plans. A:  Medications administered per MD orders.  Emotional support and encouragement given patient. R:  While talking to nurse this morning, patient denied SI and HI, contracts for safety.  Denied A/V hallucinations.  Safety maintained with 15 minute checks. Patient has been upset today with staff over her medications, upset other patients during group this morning.  Staff has talked with patient several times this morning to keep patient calm and to not disturb other patients on the unit.

## 2016-11-09 NOTE — [facility] Group Notes (Signed)
[facility] LCSW Group Therapy 11/09/2016 1:15 PM  Type of Therapy: Group Therapy- Emotion Regulation  Participation Level: Active   Participation Quality:  Attention seeking, negative  Affect: Irritable  Cognitive: Alert and Oriented   Insight:  Developing/Improving  Engagement in Therapy: Developing/Improving and Engaged   Modes of Intervention: Clarification, Confrontation, Discussion, Education, Exploration, Limit-setting, Orientation, Problem-solving, Rapport Building, Art therapist, Socialization and Support  Summary of Progress/Problems: The topic for group today was emotional regulation. This group focused on both positive and negative emotion identification and allowed group members to process ways to identify feelings, regulate negative emotions, and find healthy ways to manage internal/external emotions. Group members were asked to reflect on a time when their reaction to an emotion led to a negative outcome and explored how alternative responses using emotion regulation would have benefited them. Group members were also asked to discuss a time when emotion regulation was utilized when a negative emotion was experienced. Pt was monopolizing in group discussion and was observed to present shocking statements to get reactions from group members. At one point, Pt was discussing frustration regarding one of her children's disrespct and made a statement about how she wanted to retaliate physically. The statement was inappropriate and caused another patient to become very upset and come out of the room. Pt then reports that her feelings were hurt because the patient stormed out and expressed that she realized she didn't have a filter.    Adriana Reams, LCSW 11/09/2016 4:13 PM

## 2016-11-10 MED ORDER — PRAZOSIN HCL 1 MG PO CAPS
3.0000 mg | ORAL_CAPSULE | Freq: Every day | ORAL | Status: DC
  Administered 2016-11-10: 3 mg via ORAL
  Filled 2016-11-10 (×2): qty 3

## 2016-11-10 NOTE — Plan of Care (Signed)
Problem: Activity: Goal: Interest or engagement in activities will improve Outcome: Progressing Pt is attending groups on the unit Goal: Sleeping patterns will improve Outcome: Not Progressing Pt reports that she did not sleep well and had night terrors last night  Problem: Safety: Goal: Periods of time without injury will increase Outcome: Progressing No self injurious behavior observed or expressed

## 2016-11-10 NOTE — Progress Notes (Addendum)
[facility] MD Progress Note  11/10/2016 3:58 PM Suzanne Klein  MRN:  [id number]  Subjective:  Patient reports partial improvement , but states " I am still anxious, depressed" and states she does not feel ready for discharge yet. Today she reports ongoing PTSD related nightmares, and states Minipress is helpful, well tolerated, but that nightmares do persist, so that she is hoping to increase dose.  Objective:  I have reviewed chart notes and have met with patient . Patient presents with partial improvement compared to admission, although reports ongoing depression ( acknowledges improvement ) anxiety, and describes ongoing PTSD symptoms, mainly nightmares. At this time denies medication side effects, and reports she feels medications are helping.Focusing on increasing Minipress dose to address residual nightmares, denies lightheadedness or dizziness  No disruptive or agitated behaviors on unit,going to some groups, interactive with peers, visible in day room, at times requiring some redirection from staff due to being loud or somewhat intrusive. Patient easily redirectable and generally cooperative . Denies suicidal ideations at this time.    Principal Problem: Major depressive disorder, recurrent severe without psychotic features ([place])  Diagnosis:   Patient Active Problem List   Diagnosis Date Noted  . Major depressive disorder, recurrent severe without psychotic features ([place]) [F33.2] 11/05/2016  . Schizoaffective disorder ([place]) [F25.9] 11/04/2016  . Major depressive disorder, recurrent, severe without psychotic features ([place]) [F33.2]   . 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 Pharyngitis [J02.9] 08/21/2013  . Throat pain [R07.0] 08/21/2013  . Rash [R21] 08/21/2013  . Cervical insufficiency in pregnancy, antepartum [O34.30] 07/06/2013  . Abnormal quad screen [O28.0] 07/06/2013  . PTSD (post-traumatic stress disorder) [F43.10] 06/29/2013  .  Anxiety [F41.9] 06/29/2013  . Borderline personality disorder [F60.3] 06/29/2013  . Supervision of other normal pregnancy [Z34.80] 05/21/2013  . Previous cesarean section [Z98.891] 05/21/2013  . Tobacco smoking complicating pregnancy [A41.660] 05/21/2013   Total Time spent with patient: 20 minutes   Past Psychiatric History: PTSD, MDD  Past Medical History:  Past Medical History:  Diagnosis Date  . Abnormal Pap smear   . Borderline personality disorder    No meds  . Heartburn in pregnancy   . HPV in female   . HPV in female   . Migraines   . Panic anxiety syndrome    no meds  . PTSD (post-traumatic stress disorder)    No meds  . Scoliosis of lumbar spine   . Termination of pregnancy    x 1    Past Surgical History:  Procedure Laterality Date  . CESAREAN SECTION     x 2  . CESAREAN SECTION  07/21/2012   Procedure: CESAREAN SECTION;  Surgeon: Frederico Hamman, MD;  Location: [facility];  Service: Obstetrics;  Laterality: N/A;  Repeat Cesarean Section Delivery Boy @ [phone], Apgars 9/9  . CESAREAN SECTION    . COLPOSCOPY    . DILATION AND CURETTAGE OF UTERUS    . WISDOM TOOTH EXTRACTION     Family History:  Family History  Problem Relation Age of Onset  . Cancer Mother   . Heart disease Mother   . Dementia Maternal Grandfather   . Anesthesia problems Neg Hx    Family Psychiatric  History: See H&P  Social History:  History  Alcohol Use  . Yes     History  Drug Use No    Social History   Social History  . Marital status: Married    Spouse name: N/A  . Number of children:  N/A  . Years of education: N/A   Social History Main Topics  . Smoking status: Current Every Day Smoker    Packs/day: 0.15    Years: 13.00    Types: Cigarettes  . Smokeless tobacco: Never Used  . Alcohol use Yes  . Drug use: No  . Sexual activity: Yes    Birth control/ protection: None     Comment: pregnant   Other Topics Concern  . None   Social History Narrative   ** Merged  History Encounter **       Additional Social History:   Sleep: Fair  Appetite: improved   Current Medications: Current Facility-Administered Medications  Medication Dose Route Frequency Provider Last Rate Last Dose  . acetaminophen (TYLENOL) tablet 650 mg  650 mg Oral Q6H PRN Patrecia Pour, NP   650 mg at 11/10/16 1057  . alum & mag hydroxide-simeth (MAALOX/MYLANTA) 200-200-20 MG/5ML suspension 30 mL  30 mL Oral Q4H PRN Patrecia Pour, NP   30 mL at 11/08/16 1529  . ARIPiprazole (ABILIFY) tablet 2 mg  2 mg Oral QHS Ursula Alert, MD   2 mg at 11/09/16 2203  . gabapentin (NEURONTIN) capsule 800 mg  800 mg Oral TID Jenne Campus, MD   800 mg at 11/10/16 1210  . hydrOXYzine (ATARAX/VISTARIL) tablet 25 mg  25 mg Oral TID PRN Ursula Alert, MD   25 mg at 11/09/16 0801  . Influenza vac split quadrivalent PF (FLUARIX) injection 0.5 mL  0.5 mL Intramuscular Tomorrow-1000 Rozetta Nunnery, NP      . lidocaine (LIDODERM) 5 % 1 patch  1 patch Transdermal Q24H Jenne Campus, MD   1 patch at 11/10/16 1309  . magnesium hydroxide (MILK OF MAGNESIA) suspension 30 mL  30 mL Oral Daily PRN Patrecia Pour, NP   30 mL at 11/08/16 1042  . nicotine (NICODERM CQ - dosed in mg/24 hours) patch 21 mg  21 mg Transdermal Daily Jenne Campus, MD   21 mg at 11/10/16 0743  . ondansetron (ZOFRAN-ODT) disintegrating tablet 4 mg  4 mg Oral Q8H PRN Derrill Center, NP   4 mg at 11/08/16 1306  . pantoprazole (PROTONIX) EC tablet 40 mg  40 mg Oral Daily Jenne Campus, MD   40 mg at 11/10/16 0744  . PARoxetine (PAXIL) tablet 20 mg  20 mg Oral Daily Ursula Alert, MD   20 mg at 11/10/16 0744  . prazosin (MINIPRESS) capsule 3 mg  3 mg Oral QHS Myer Peer Cobos, MD      . traZODone (DESYREL) tablet 75 mg  75 mg Oral QHS Jenne Campus, MD   75 mg at 11/09/16 2203   Lab Results:  No results found for this or any previous visit (from the past 48 hour(s)). Blood Alcohol level:  Lab Results  Component Value Date    [facility] <5 11/03/2016   ETH <5 92/33/0076   Metabolic Disorder Labs: Lab Results  Component Value Date   HGBA1C 5.7 (H) 11/08/2016   MPG 117 11/08/2016   Lab Results  Component Value Date   PROLACTIN 21.8 11/08/2016   Lab Results  Component Value Date   CHOL 167 11/08/2016   TRIG 162 (H) 11/08/2016   HDL 37 (L) 11/08/2016   CHOLHDL 4.5 11/08/2016   VLDL 32 11/08/2016   LDLCALC 98 11/08/2016   Physical Findings: AIMS: Facial and Oral Movements Muscles of Facial Expression: None, normal Lips and Perioral Area: None, normal Jaw:  None, normal Tongue: None, normal,Extremity Movements Upper (arms, wrists, hands, fingers): None, normal Lower (legs, knees, ankles, toes): None, normal, Trunk Movements Neck, shoulders, hips: None, normal, Overall Severity Severity of abnormal movements (highest score from questions above): None, normal Incapacitation due to abnormal movements: None, normal Patient's awareness of abnormal movements (rate only patient's report): No Awareness, Dental Status Current problems with teeth and/or dentures?: No Does patient usually wear dentures?: No  CIWA:  CIWA-Ar Total: 3 COWS:  COWS Total Score: 2  Musculoskeletal: Strength & Muscle Tone: within normal limits Gait & Station: normal- ambulation slowed by bilateral leg pain Patient leans: N/A  Psychiatric Specialty Exam: Physical Exam  Nursing note and vitals reviewed. Constitutional: She is oriented to person, place, and time.  Neurological: She is alert and oriented to person, place, and time.  Psychiatric: She has a normal mood and affect. Her behavior is normal.    ROS denies headache, no chest pain, no shortness of breath, no vomiting,  Reports chronic pain affecting mostly knees , lower extremities   Blood pressure 121/67, pulse (!) 108, temperature 98.2 F (36.8 C), temperature source Oral, resp. rate 16, height _0  (1.676 m), weight 95.3 kg (210 lb), last menstrual period 11/03/2016, SpO2 99  %.Body mass index is 33.89 kg/m.  General Appearance: improving  Eye Contact:  Improved    Speech:  normal  Volume:  Normal  Mood:  Remains depressed, but has improved partially since admission  Affect: remains anxious, more reactive   Thought Process:  Linear   Orientation:  Full (Time, Place, and Person)  Thought Content:  Denies hallucinations, no delusions, not internally preoccupied   Suicidal Thoughts:  No denies suicidal ideations, denies any self injurious ideations   Homicidal Thoughts:  No  Memory:  Recent and remote grossly intact   Judgement:  Improving   Insight: improving   Psychomotor Activity:  Normal  Concentration:  Concentration: Good and Attention Span: Good  Recall:  Good  Fund of Knowledge:  Good  Language:  Good  Akathisia:  No  Handed:  Right  AIMS (if indicated):     Assets:  Desire for Improvement Intimacy Talents/Skills  ADL's:  Intact  Cognition:  WNL  Sleep:  Number of Hours: 6.75   Assessment - patient is improving compared to admission, and acknowledges improving mood. Anxiety also improving, but reports ongoing anxiety and PTSD related symptoms, particularly nightmares, which she states have improved only partially on Minipress. Denies medication side effects, and denies dizziness or lightheadedness/pre-syncopal symptoms from this medication.  No active suicidal ideations or self injurious ideations at this time  Treatment Plan Summary: Daily contact with patient to assess and evaluate symptoms and progress in treatment and Medication management Encourage group and milieu participation to work on coping skills and symptom reduction Treatment team working on disposition planning    For Depressive /affective sx:  Will Contiune Paxil 20 mg QDAY   For Psychosis: Will Continue Abilify 2 mg QHS   For Insomnia: Continue  Trazodone  75 mg QHS  For nightmares: Increase Prazosin to 3 mg QHS- side effects reviewed   For anxiety/agitation, and  pain: Changed Neurontin to 800 mgrs TID  For GERD: Will continue Protonix 40 mgrs QDAY  Treatment team working on disposition planning options   Check FT3, FT4, to follow up on mildly elevated TSH  Suzanne Garnet, MD 11/10/2016, 3:58 PM  Patient ID: Suzanne Klein, female   DOB: January 11, 1981, 36 y.o.   MRN: [id number]

## 2016-11-10 NOTE — Progress Notes (Signed)
D:Pt came back from dinner anxious reporting that she ate spaghetti and is allergic to tomatoes. Pt said that her throat was itchy. A:Gave prn vistaril for anxiety and itching. Will continue to monitor. R:Safety maintained.

## 2016-11-10 NOTE — Progress Notes (Signed)
Pt c/o pain in multiple areas including knees. PRN pain medication given and offered ice/heat. Pt accepted heat packs and requested tape so that she could tape them to her knees. Pt requested a walker and writer explained that MD would need to access and place an order before she could be given a walker. Pt is currently talking with the MD. Safety maintained.

## 2016-11-10 NOTE — Progress Notes (Signed)
Pt has been up and out in the milieu.  She has spent most of her time in the dayroom talking and interacting with peers.  She can be loud and intrusive at times, and was heard talking to another patient about what she should do about a conflict in her personal relationship.  This evening she was pleasant and polite with Probation officer.  She voiced no needs or concerns.  She took her medications without incident and removed her lidocaine patch before bed so that she did not have to be awakened during the night.  Pt denies SI/HI/AVH.  She did says she was having passive HI toward her ex husband, but would never do anything because he was not worth it.  Pt makes her needs known to staff.  Support and encouragement offered.  Discharge plans are in process.  Safety maintained with q15 minute checks.

## 2016-11-10 NOTE — Progress Notes (Signed)
D:Pt rates depression as an 8 and anxiety as a 10 on 0-10 scale with 10 being the most. Pt was at the nurses station as Probation officer finished report requesting scheduled pain medication. She reports HI thoughts toward her ex husband. Pt says that she had night terrors last night and when asked about auditory hallucinations she said that the voices were asleep.  A:Gave medications as ordered. Offered support and 15 minute checks.  R:Pt denies si. Safety maintained on the unit.

## 2016-11-10 NOTE — BHH Group Notes (Signed)
[facility] LCSW Group Therapy 11/10/2016 1:15 PM  Type of Therapy: Group Therapy- Emotion Regulation  Participation Level: Active   Participation Quality:  Attention-seeking  Affect: Appropriate  Cognitive: Alert and Oriented   Insight:  Limited  Engagement in Therapy: Developing/Improving and Engaged   Modes of Intervention: Clarification, Confrontation, Discussion, Education, Exploration, Limit-setting, Orientation, Problem-solving, Rapport Building, Art therapist, Socialization and Support  Summary of Progress/Problems: The topic for group today was emotional regulation. This group focused on both positive and negative emotion identification and allowed group members to process ways to identify feelings, regulate negative emotions, and find healthy ways to manage internal/external emotions. Group members were asked to reflect on a time when their reaction to an emotion led to a negative outcome and explored how alternative responses using emotion regulation would have benefited them. Group members were also asked to discuss a time when emotion regulation was utilized when a negative emotion was experienced. Pt continues to monopolize at times in group and be attention-seeking in her participation. Pt expressed that she feels "every anger emotion and sad emotion" when she gets upset. Pt expressed that she does not have the ability to regulate her emotions.   Adriana Reams, LCSW 11/10/2016 9:51 AM

## 2016-11-10 NOTE — Progress Notes (Signed)
Patient currently asleep peacefully in bedroom. Pt in no current distress. Will continue to monitor. 

## 2016-11-10 NOTE — Tx Team (Signed)
Interdisciplinary Treatment and Diagnostic Plan Update  11/10/2016 Time of Session: 9:46 AM  Suzanne Klein MRN: [id number]  Principal Diagnosis: Major depressive disorder, recurrent severe without psychotic features ([place])  Secondary Diagnoses: Principal Problem:   Major depressive disorder, recurrent severe without psychotic features ([place]) Active Problems:   PTSD (post-traumatic stress disorder)   Borderline personality disorder   Current Medications:  Current Facility-Administered Medications  Medication Dose Route Frequency Provider Last Rate Last Dose  . acetaminophen (TYLENOL) tablet 650 mg  650 mg Oral Q6H PRN Patrecia Pour, NP   650 mg at 11/09/16 0800  . alum & mag hydroxide-simeth (MAALOX/MYLANTA) 200-200-20 MG/5ML suspension 30 mL  30 mL Oral Q4H PRN Patrecia Pour, NP   30 mL at 11/08/16 1529  . ARIPiprazole (ABILIFY) tablet 2 mg  2 mg Oral QHS Ursula Alert, MD   2 mg at 11/09/16 2203  . gabapentin (NEURONTIN) capsule 800 mg  800 mg Oral TID Jenne Campus, MD   800 mg at 11/10/16 0744  . hydrOXYzine (ATARAX/VISTARIL) tablet 25 mg  25 mg Oral TID PRN Ursula Alert, MD   25 mg at 11/09/16 0801  . Influenza vac split quadrivalent PF (FLUARIX) injection 0.5 mL  0.5 mL Intramuscular Tomorrow-1000 Rozetta Nunnery, NP      . lidocaine (LIDODERM) 5 % 1 patch  1 patch Transdermal Q24H Jenne Campus, MD   1 patch at 11/09/16 1253  . magnesium hydroxide (MILK OF MAGNESIA) suspension 30 mL  30 mL Oral Daily PRN Patrecia Pour, NP   30 mL at 11/08/16 1042  . nicotine (NICODERM CQ - dosed in mg/24 hours) patch 21 mg  21 mg Transdermal Daily Jenne Campus, MD   21 mg at 11/10/16 0743  . ondansetron (ZOFRAN-ODT) disintegrating tablet 4 mg  4 mg Oral Q8H PRN Derrill Center, NP   4 mg at 11/08/16 1306  . pantoprazole (PROTONIX) EC tablet 40 mg  40 mg Oral Daily Jenne Campus, MD   40 mg at 11/10/16 0744  . PARoxetine (PAXIL) tablet 20 mg  20 mg Oral Daily Ursula Alert, MD    20 mg at 11/10/16 0744  . prazosin (MINIPRESS) capsule 2 mg  2 mg Oral QHS Patrecia Pour, NP   2 mg at 11/09/16 2203  . traZODone (DESYREL) tablet 75 mg  75 mg Oral QHS Jenne Campus, MD   75 mg at 11/09/16 2203    PTA Medications: Prescriptions Prior to Admission  Medication Sig Dispense Refill Last Dose  . DULoxetine (CYMBALTA) 30 MG capsule Take 30 mg by mouth daily.   11/02/2016 at Unknown time  . gabapentin (NEURONTIN) 600 MG tablet Take 600 mg by mouth 4 (four) times daily.   11/03/2016 at Unknown time  . mirtazapine (REMERON) 15 MG tablet Take 7.5-15 mg by mouth at bedtime.   11/02/2016 at Unknown time  . OLANZapine (ZYPREXA) 5 MG tablet Take 5 mg by mouth at bedtime.   11/02/2016 at Unknown time  . prazosin (MINIPRESS) 1 MG capsule Take 5 mg by mouth at bedtime.     . RaNITidine HCl (ZANTAC PO) Take 1-2 tablets by mouth daily as needed (heart burn).   Past Week at Unknown time  . guaiFENesin-codeine 100-10 MG/5ML syrup Take 5 mLs by mouth 3 (three) times daily as needed for cough. (Patient not taking: Reported on 11/05/2016) 120 mL 0 Not Taking at Unknown time  . HYDROcodone-acetaminophen (NORCO/VICODIN) 5-325 MG tablet Take 1-2 tablets  by mouth every 6 (six) hours as needed. (Patient not taking: Reported on 11/05/2016) 5 tablet 0 Not Taking at Unknown time  . ibuprofen (ADVIL,MOTRIN) 400 MG tablet Take 1 tablet (400 mg total) by mouth every 6 (six) hours as needed. (Patient not taking: Reported on 11/05/2016) 30 tablet 0 Not Taking at Unknown time  . pantoprazole (PROTONIX) 20 MG tablet Take 1 tablet (20 mg total) by mouth daily. (Patient not taking: Reported on 11/05/2016) 30 tablet 0 Not Taking at Unknown time  . prazosin (MINIPRESS) 2 MG capsule Take 1 capsule (2 mg total) by mouth at bedtime. (Patient not taking: Reported on 11/05/2016) 30 capsule 0 Not Taking at Unknown time    Treatment Modalities: Medication Management, Group therapy, Case management,  1 to 1 session with clinician,  Psychoeducation, Recreational therapy.   Physician Treatment Plan for Primary Diagnosis: Major depressive disorder, recurrent severe without psychotic features ([place]) Long Term Goal(s): Improvement in symptoms so as ready for discharge  Short Term Goals: Ability to identify changes in lifestyle to reduce recurrence of condition will improve  Medication Management: Evaluate patient's response, side effects, and tolerance of medication regimen.  Therapeutic Interventions: 1 to 1 sessions, Unit Group sessions and Medication administration.  Evaluation of Outcomes: Progressing  Physician Treatment Plan for Secondary Diagnosis: Principal Problem:   Major depressive disorder, recurrent severe without psychotic features ([place]) Active Problems:   PTSD (post-traumatic stress disorder)   Borderline personality disorder   Long Term Goal(s): Improvement in symptoms so as ready for discharge  Short Term Goals: Ability to demonstrate self-control will improve  Medication Management: Evaluate patient's response, side effects, and tolerance of medication regimen.  Therapeutic Interventions: 1 to 1 sessions, Unit Group sessions and Medication administration.  Evaluation of Outcomes: Progressing   RN Treatment Plan for Primary Diagnosis: Major depressive disorder, recurrent severe without psychotic features ([place]) Long Term Goal(s): Knowledge of disease and therapeutic regimen to maintain health will improve  Short Term Goals: Ability to disclose and discuss suicidal ideas and Ability to identify and develop effective coping behaviors will improve  Medication Management: RN will administer medications as ordered by provider, will assess and evaluate patient's response and provide education to patient for prescribed medication. RN will report any adverse and/or side effects to prescribing provider.  Therapeutic Interventions: 1 on 1 counseling sessions, Psychoeducation, Medication administration,  Evaluate responses to treatment, Monitor vital Klein and CBGs as ordered, Perform/monitor CIWA, COWS, AIMS and Fall Risk screenings as ordered, Perform wound care treatments as ordered.  Evaluation of Outcomes: Progressing   LCSW Treatment Plan for Primary Diagnosis: Major depressive disorder, recurrent severe without psychotic features ([place]) Long Term Goal(s): Safe transition to appropriate next level of care at discharge, Engage patient in therapeutic group addressing interpersonal concerns.  Short Term Goals: Engage patient in aftercare planning with referrals and resources and Increase skills for wellness and recovery  Therapeutic Interventions: Assess for all discharge needs, 1 to 1 time with Social worker, Explore available resources and support systems, Assess for adequacy in community support network, Educate family and significant other(s) on suicide prevention, Complete Psychosocial Assessment, Interpersonal group therapy.  Evaluation of Outcomes: Progressing   Progress in Treatment: Attending groups: Yes Participating in groups: Yes Taking medication as prescribed: Yes, MD continues to assess for medication changes as needed Toleration medication: Yes, no side effects reported at this time Family/Significant other contact made: Yes, Keturah Shavers (husband, [phone])  Patient understands diagnosis: Limited insight  Discussing patient identified problems/goals with staff: Yes  Medical problems stabilized or resolved: Yes Denies suicidal/homicidal ideation: No  Issues/concerns per patient self-inventory: None Other: N/A  New problem(s) identified: None identified at this time.   New Short Term/Long Term Goal(s): None identified at this time.   Discharge Plan or Barriers: Return home, and follow up with [facility]; currently requesting ACTT referral  Reason for Continuation of Hospitalization: Anxiety Depression Hallucinations Medication stabilization Suicidal  ideation  Estimated Length of Stay: 3-5 days  Attendees: Patient: 11/10/2016  9:46 AM  Physician: Dr. Parke Poisson 11/10/2016  9:46 AM  Nursing: Desma Paganini, RN 11/10/2016  9:46 AM  RN Care Manager: Lars Pinks 11/10/2016  9:46 AM  Social Worker: Adriana Reams, LCSW 11/10/2016  9:46 AM  Recreational Therapist:  11/10/2016  9:46 AM  Other: Priscille Loveless, NP  11/10/2016  9:46 AM  Other:  11/10/2016  9:46 AM  Other: 11/10/2016  9:46 AM    Scribe for Treatment Team: Adriana Reams, [place] Work [phone]

## 2016-11-11 DIAGNOSIS — E039 Hypothyroidism, unspecified: Secondary | ICD-10-CM

## 2016-11-11 LAB — T4, FREE: Free T4: 0.57 ng/dL — ABNORMAL LOW (ref 0.61–1.12)

## 2016-11-11 MED ORDER — LEVOTHYROXINE SODIUM 25 MCG PO TABS
25.0000 ug | ORAL_TABLET | Freq: Every day | ORAL | Status: DC
  Administered 2016-11-12 – 2016-11-13 (×2): 25 ug via ORAL
  Filled 2016-11-11 (×4): qty 1

## 2016-11-11 MED ORDER — PRAZOSIN HCL 2 MG PO CAPS
4.0000 mg | ORAL_CAPSULE | Freq: Every day | ORAL | Status: DC
  Administered 2016-11-11 – 2016-11-12 (×2): 4 mg via ORAL
  Filled 2016-11-11 (×4): qty 2

## 2016-11-11 NOTE — BHH Group Notes (Signed)
[facility] LCSW Group Therapy 11/11/2016 1:15pm  Type of Therapy: Group Therapy- Balance in Life  Participation Level: Intrusive, Distracting  Description of the Group:  The topic for group was balance in life. Today's group focused on defining balance in one's own words, identifying things that can knock one off balance, and exploring healthy ways to maintain balance in life. Group members were asked to provide an example of a time when they felt off balance, describe how they handled that situation,and process healthier ways to regain balance in the future. Group members were asked to share the most important tool for maintaining balance that they learned while at [facility] and how they plan to apply this method after discharge.  Summary of Patient Progress Pt discussed her split personalities, who she refers to as "the girls." Pt reports that she talks with them. Pt is observed to be intrusive and engages in side conversations with other peers. Pt reports that she feels unbalanced with she is "bouncing from personality to personality."   Therapeutic Modalities:   Cognitive Behavioral Therapy Solution-Focused Therapy Assertiveness Training   Adriana Reams, LCSW 11/11/2016 3:50 PM

## 2016-11-11 NOTE — Progress Notes (Signed)
Patient ID: Suzanne Klein, female   DOB: [birth year], 36 y.o.   MRN: [id number]  DAR: Pt. Denies SI/HI and A/V Hallucinations. She reports sleep is poor, appetite is fair, energy level is low, and concentration is poor. She rates depression 8/10, hopelessness 8/10, and anxiety 9/10. Patient does present with anxious affect and is active in the milieu. She reports a pain level 9/10 in her bilateral feet but refuses intervention at this time. She states, "I will come back for a heat pack later." However, when it was offered again later she refused. Patient is intrusive at times in other patient's care. She comes to Probation officer and [employer] a couple of times today asking that staff speak with another patient and asking questions about that patient's care. She is redirected and educated about privacy and confidentiality. She continues to be intrusive. Support and encouragement provided to the patient. Scheduled medications administered to patient per physician's orders. Patient is seen in the milieu and is attending groups. Q15 minute checks are maintained for safety.

## 2016-11-11 NOTE — Progress Notes (Signed)
[facility] MD Progress Note  11/11/2016 2:56 PM Suzanne Klein  MRN:  [id number]  Subjective: Patient endorses partial improvement compared to admission, but continues to report feeling vaguely depressed, anxious . She states she continues to have nightmares, associated with her history of PTSD, and is hoping to continue increasing Minipress dose in an effort to decrease this symptom. Denies medication side effects, and specifically denies any lightheadedness or dizziness from Minipress management . Denies suicidal ideations   Objective:  I have reviewed chart notes and have met with patient . Patient is presenting with improving mood and range of affect, but states she continues to feel some depression and anxiety. Denies any suicidal ideations, but stresses she is still significantly depressed, anxious and  does not feel ready to discharge yet As above, has reported persistent nightmares, which she associates to PTSD. She does not feel these are related to Desyrel side effect, as she has had these nightmares " for a long time". No disruptive or agitated behaviors on unit. Denies medication side effects. TSH is mildly elevated, and FT4 is decreased ( 0.57) - patient reports potential symptoms of hypothyroidism- low energy, weight gain/difficulty losing weight/depression.   Principal Problem: Major depressive disorder, recurrent severe without psychotic features ([place])  Diagnosis:   Patient Active Problem List   Diagnosis Date Noted  . Major depressive disorder, recurrent severe without psychotic features ([place]) [F33.2] 11/05/2016  . Schizoaffective disorder ([place]) [F25.9] 11/04/2016  . Major depressive disorder, recurrent, severe without psychotic features ([place]) [F33.2]   . 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 Pharyngitis [J02.9] 08/21/2013  . Throat pain [R07.0] 08/21/2013  . Rash [R21] 08/21/2013  . Cervical insufficiency in pregnancy, antepartum  [O34.30] 07/06/2013  . Abnormal quad screen [O28.0] 07/06/2013  . PTSD (post-traumatic stress disorder) [F43.10] 06/29/2013  . Anxiety [F41.9] 06/29/2013  . Borderline personality disorder [F60.3] 06/29/2013  . Supervision of other normal pregnancy [Z34.80] 05/21/2013  . Previous cesarean section [Z98.891] 05/21/2013  . Tobacco smoking complicating pregnancy [V77.939] 05/21/2013   Total Time spent with patient: 20 minutes   Past Psychiatric History: PTSD, MDD  Past Medical History:  Past Medical History:  Diagnosis Date  . Abnormal Pap smear   . Borderline personality disorder    No meds  . Heartburn in pregnancy   . HPV in female   . HPV in female   . Migraines   . Panic anxiety syndrome    no meds  . PTSD (post-traumatic stress disorder)    No meds  . Scoliosis of lumbar spine   . Termination of pregnancy    x 1    Past Surgical History:  Procedure Laterality Date  . CESAREAN SECTION     x 2  . CESAREAN SECTION  07/21/2012   Procedure: CESAREAN SECTION;  Surgeon: Frederico Hamman, MD;  Location: [facility];  Service: Obstetrics;  Laterality: N/A;  Repeat Cesarean Section Delivery Boy @ [phone], Apgars 9/9  . CESAREAN SECTION    . COLPOSCOPY    . DILATION AND CURETTAGE OF UTERUS    . WISDOM TOOTH EXTRACTION     Family History:  Family History  Problem Relation Age of Onset  . Cancer Mother   . Heart disease Mother   . Dementia Maternal Grandfather   . Anesthesia problems Neg Hx    Family Psychiatric  History: See H&P  Social History:  History  Alcohol Use  . Yes     History  Drug Use No  Social History   Social History  . Marital status: Married    Spouse name: N/A  . Number of children: N/A  . Years of education: N/A   Social History Main Topics  . Smoking status: Current Every Day Smoker    Packs/day: 0.15    Years: 13.00    Types: Cigarettes  . Smokeless tobacco: Never Used  . Alcohol use Yes  . Drug use: No  . Sexual activity: Yes    Birth  control/ protection: None     Comment: pregnant   Other Topics Concern  . None   Social History Narrative   ** Merged History Encounter **       Additional Social History:   Sleep: Fair- reports ongoing nightmares   Appetite: improved   Current Medications: Current Facility-Administered Medications  Medication Dose Route Frequency Provider Last Rate Last Dose  . acetaminophen (TYLENOL) tablet 650 mg  650 mg Oral Q6H PRN Patrecia Pour, NP   650 mg at 11/10/16 1057  . alum & mag hydroxide-simeth (MAALOX/MYLANTA) 200-200-20 MG/5ML suspension 30 mL  30 mL Oral Q4H PRN Patrecia Pour, NP   30 mL at 11/08/16 1529  . ARIPiprazole (ABILIFY) tablet 2 mg  2 mg Oral QHS Ursula Alert, MD   2 mg at 11/10/16 2050  . gabapentin (NEURONTIN) capsule 800 mg  800 mg Oral TID Jenne Campus, MD   800 mg at 11/11/16 1138  . hydrOXYzine (ATARAX/VISTARIL) tablet 25 mg  25 mg Oral TID PRN Ursula Alert, MD   25 mg at 11/10/16 1819  . Influenza vac split quadrivalent PF (FLUARIX) injection 0.5 mL  0.5 mL Intramuscular Tomorrow-1000 Rozetta Nunnery, NP      . Derrill Memo ON 11/12/2016] levothyroxine (SYNTHROID, LEVOTHROID) tablet 25 mcg  25 mcg Oral QAC breakfast Tawni Millers, MD      . lidocaine (LIDODERM) 5 % 1 patch  1 patch Transdermal Q24H Jenne Campus, MD   1 patch at 11/11/16 1138  . magnesium hydroxide (MILK OF MAGNESIA) suspension 30 mL  30 mL Oral Daily PRN Patrecia Pour, NP   30 mL at 11/08/16 1042  . nicotine (NICODERM CQ - dosed in mg/24 hours) patch 21 mg  21 mg Transdermal Daily Jenne Campus, MD   21 mg at 11/11/16 7829  . ondansetron (ZOFRAN-ODT) disintegrating tablet 4 mg  4 mg Oral Q8H PRN Derrill Center, NP   4 mg at 11/08/16 1306  . pantoprazole (PROTONIX) EC tablet 40 mg  40 mg Oral Daily Jenne Campus, MD   40 mg at 11/11/16 5621  . PARoxetine (PAXIL) tablet 20 mg  20 mg Oral Daily Ursula Alert, MD   20 mg at 11/11/16 0813  . prazosin (MINIPRESS) capsule 4 mg  4 mg  Oral QHS Myer Peer Cobos, MD      . traZODone (DESYREL) tablet 75 mg  75 mg Oral QHS Jenne Campus, MD   75 mg at 11/10/16 2049   Lab Results:  Results for orders placed or performed during the hospital encounter of 11/05/16 (from the past 48 hour(s))  T4, free     Status: Abnormal   Collection Time: 11/11/16  6:11 AM  Result Value Ref Range   Free T4 0.57 (L) 0.61 - 1.12 ng/dL    Comment: (NOTE) Biotin ingestion may interfere with free T4 tests. If the results are inconsistent with the TSH level, previous test results, or the clinical presentation, then consider  biotin interference. If needed, order repeat testing after stopping biotin. Performed at [facility], [place] [address] [place], [place] [address]    Blood Alcohol level:  Lab Results  Component Value Date   [facility] <5 11/03/2016   ETH <5 58/25/1898   Metabolic Disorder Labs: Lab Results  Component Value Date   HGBA1C 5.7 (H) 11/08/2016   MPG 117 11/08/2016   Lab Results  Component Value Date   PROLACTIN 21.8 11/08/2016   Lab Results  Component Value Date   CHOL 167 11/08/2016   TRIG 162 (H) 11/08/2016   HDL 37 (L) 11/08/2016   CHOLHDL 4.5 11/08/2016   VLDL 32 11/08/2016   LDLCALC 98 11/08/2016   Physical Findings: AIMS: Facial and Oral Movements Muscles of Facial Expression: None, normal Lips and Perioral Area: None, normal Jaw: None, normal Tongue: None, normal,Extremity Movements Upper (arms, wrists, hands, fingers): None, normal Lower (legs, knees, ankles, toes): None, normal, Trunk Movements Neck, shoulders, hips: None, normal, Overall Severity Severity of abnormal movements (highest score from questions above): None, normal Incapacitation due to abnormal movements: None, normal Patient's awareness of abnormal movements (rate only patient's report): No Awareness, Dental Status Current problems with teeth and/or dentures?: No Does patient usually wear dentures?: No  CIWA:  CIWA-Ar Total:  3 COWS:  COWS Total Score: 2  Musculoskeletal: Strength & Muscle Tone: within normal limits Gait & Station: normal- ambulation slowed by bilateral leg pain Patient leans: N/A  Psychiatric Specialty Exam: Physical Exam  Nursing note and vitals reviewed. Constitutional: She is oriented to person, place, and time.  Neurological: She is alert and oriented to person, place, and time.  Psychiatric: She has a normal mood and affect. Her behavior is normal.    ROS denies headache, no chest pain, no shortness of breath, no vomiting,  Reports chronic pain affecting mostly knees , lower extremities   Blood pressure 123/61, pulse (!) 101, temperature 97.9 F (36.6 C), resp. rate 16, height 5' 6" (1.676 m), weight 95.3 kg (210 lb), last menstrual period 11/03/2016, SpO2 99 %.Body mass index is 33.89 kg/m.  General Appearance: improving  Eye Contact:  Improved    Speech:  normal  Volume:  Normal  Mood:  Gradually improving but still depressed , anxious  Affect: mildly constricted, anxious    Thought Process:  Linear   Orientation:  Full (Time, Place, and Person)  Thought Content:  Denies hallucinations, no delusions, not internally preoccupied   Suicidal Thoughts:  No denies suicidal ideations, denies any self injurious ideations   Homicidal Thoughts:  No  Memory:  Recent and remote grossly intact   Judgement:  Improving   Insight: improving   Psychomotor Activity:  Normal  Concentration:  Concentration: Good and Attention Span: Good  Recall:  Good  Fund of Knowledge:  Good  Language:  Good  Akathisia:  No  Handed:  Right  AIMS (if indicated):     Assets:  Desire for Improvement Intimacy Talents/Skills  ADL's:  Intact  Cognition:  WNL  Sleep:  Number of Hours: 6.25   Assessment - partial improvement compared to admission , mood is improving, but anxiety is more persistent. No suicidal ideations.She endorses ongoing nightmares, partially improved but still subjectively distressing ,  in spite of Minipress titration, which thus far has not caused any side effects. TSH mildly elevated and T4 is low.   Treatment Plan Summary: Daily contact with patient to assess and evaluate symptoms and progress in treatment and Medication management Encourage  group and milieu participation to work on coping skills and symptom reduction Treatment team working on disposition planning    For Depressive /affective sx:  Will Contiune Paxil 20 mg QDAY   For Psychosis: Will Continue Abilify 2 mg QHS   For Insomnia: Continue  Trazodone  75 mg QHS  For nightmares: Increase Prazosin to 4 mg QHS- side effects reviewed   For anxiety/agitation, and pain: Changed Neurontin to 800 mgrs TID  For GERD: Will continue Protonix 40 mgrs QDAY  For abnormal Thyroid Function Tests/ Hypothyroidism:  Have consulted hospitalist to discuss starting management   Treatment team working on disposition planning options   Neita Garnet, MD 11/11/2016, 2:56 PM   Patient ID: Suzanne Klein, female   DOB: 04-29-1981, 36 y.o.   MRN: [id number]

## 2016-11-11 NOTE — Consult Note (Signed)
Medical Consultation   Suzanne Klein  [id number]  DOB: [birth year]  DOA: 11/05/2016  PCP: No PCP Per Patient    Requesting physician: Dr. Parke Poisson  Reason for consultation: Abnormal Thyroid function testing.    History of Present Illness: Suzanne Klein is an 36 y.o. female who has been admitted to the psychiatric ward for major depressive disorder with psychotic features.  As part of her evaluation, thyroid function testing was performed, and reported a high TSH with a low free T4.  Patient reports that in the past she was been told about having thyroid dysfunction unclear about specifics. 4 years ago after her last delivery she has noticed decreased level of energy, daytime sleepiness and weight gain. She has not seek medical attention for her reported thyroid dysfunction. Denies any heat or cold intolerance, denies tremors, denies any significant hear loss.     Review of Systems:  ROS As per HPI otherwise 10 point review of systems were reviewed and found to be negative.    Past Medical History: Past Medical History:  Diagnosis Date  . Abnormal Pap smear   . Borderline personality disorder    No meds  . Heartburn in pregnancy   . HPV in female   . HPV in female   . Migraines   . Panic anxiety syndrome    no meds  . PTSD (post-traumatic stress disorder)    No meds  . Scoliosis of lumbar spine   . Termination of pregnancy    x 1    Past Surgical History: Past Surgical History:  Procedure Laterality Date  . CESAREAN SECTION     x 2  . CESAREAN SECTION  07/21/2012   Procedure: CESAREAN SECTION;  Surgeon: Frederico Hamman, MD;  Location: [facility];  Service: Obstetrics;  Laterality: N/A;  Repeat Cesarean Section Delivery Boy @ [phone], Apgars 9/9  . CESAREAN SECTION    . COLPOSCOPY    . DILATION AND CURETTAGE OF UTERUS    . WISDOM TOOTH EXTRACTION       Allergies:   Allergies  Allergen Reactions  . Poison Oak Extract [Poison Oak  Extract] Anaphylaxis and Itching    Anaphylaxis occurs when the plant is burning    . Banana Nausea And Vomiting and Rash  . Latex Rash  . Poison Ivy Extract [Poison Ivy Extract] Rash  . Tomato Rash     Social History:  reports that she has been smoking Cigarettes.  She has a 1.95 pack-year smoking history. She has never used smokeless tobacco. She reports that she drinks alcohol. She reports that she does not use drugs.   Family History: Family History  Problem Relation Age of Onset  . Cancer Mother   . Heart disease Mother   . Dementia Maternal Grandfather   . Anesthesia problems Neg Hx     Unacceptable: Noncontributory, unremarkable, or negative. Acceptable: Family history reviewed and not pertinent (If you reviewed it)   Physical Exam: Vitals:   11/10/16 0615 11/10/16 2100 11/11/16 0700 11/11/16 0701  BP: 121/67 137/73 124/74 123/61  Pulse: (!) 108 88 89 (!) 101  Resp:  18 16   Temp:   97.9 F (36.6 C)   TempSrc:      SpO2:      Weight:      Height:        Constitutional:  Alert and awake, oriented x3, not in any acute distress.  Not in pain or dyspnea.  Head normocephalic, nose and ears no deformities.  Eyes: PERLA, EOMI, irises appear normal, anicteric sclera,  ENMT: external ears and nose appear normal, normal hearing or hard of hearing            Lips appears normal, oropharynx mucosa, tongue, posterior pharynx appear normal  Neck: neck appears normal, no masses, normal ROM, no thyromegaly, no JVD  CVS: S1-S2 clear, no murmur rubs or gallops, non lo edema, normal pedal pulses  Respiratory:  clear to auscultation bilaterally, no wheezing, rales or rhonchi. Respiratory effort normal. No accessory muscle use.  Abdomen: soft nontender, nondistended, normal bowel sounds, no hepatosplenomegaly, no hernias  Musculoskeletal: : no cyanosis, clubbing or edema noted bilaterally                       Joint/bones/muscle exam, strength, contractures or atrophy Neuro:  Cranial nerves II-XII intact, strength, sensation, reflexes Skin: no rashes or lesions or ulcers, no induration or nodules    Data reviewed:  I have personally reviewed following labs and imaging studies Labs:  CBC: No results for input(s): WBC, NEUTROABS, HGB, HCT, MCV, PLT in the last 168 hours.  Basic Metabolic Panel: No results for input(s): NA, K, CL, CO2, GLUCOSE, BUN, CREATININE, CALCIUM, MG, PHOS in the last 168 hours. GFR Estimated Creatinine Clearance: 113.1 mL/min (by C-G formula based on SCr of 0.74 mg/dL). Liver Function Tests: No results for input(s): AST, ALT, ALKPHOS, BILITOT, PROT, ALBUMIN in the last 168 hours. No results for input(s): LIPASE, AMYLASE in the last 168 hours. No results for input(s): AMMONIA in the last 168 hours. Coagulation profile No results for input(s): INR, PROTIME in the last 168 hours.  Cardiac Enzymes: No results for input(s): CKTOTAL, CKMB, CKMBINDEX, TROPONINI in the last 168 hours. BNP: Invalid input(s): POCBNP CBG: No results for input(s): GLUCAP in the last 168 hours. D-Dimer No results for input(s): DDIMER in the last 72 hours. Hgb A1c No results for input(s): HGBA1C in the last 72 hours. Lipid Profile No results for input(s): CHOL, HDL, LDLCALC, TRIG, CHOLHDL, LDLDIRECT in the last 72 hours. Thyroid function studies No results for input(s): TSH, T4TOTAL, T3FREE, THYROIDAB in the last 72 hours.  Invalid input(s): FREET3 Anemia work up No results for input(s): VITAMINB12, FOLATE, FERRITIN, TIBC, IRON, RETICCTPCT in the last 72 hours. Urinalysis    Component Value Date/Time   COLORURINE RED (A) 09/20/2015 1355   APPEARANCEUR TURBID (A) 09/20/2015 1355   APPEARANCEUR Cloudy 04/02/2012 2200   LABSPEC 1.028 09/20/2015 1355   LABSPEC 1.027 04/02/2012 2200   PHURINE 5.0 09/20/2015 1355   GLUCOSEU NEGATIVE 09/20/2015 1355   GLUCOSEU Negative 04/02/2012 2200   HGBUR LARGE (A) 09/20/2015 1355   BILIRUBINUR SMALL (A) 09/20/2015  1355   BILIRUBINUR NEGATIVE 08/21/2013 0940   BILIRUBINUR Negative 04/02/2012 2200   KETONESUR 15 (A) 09/20/2015 1355   PROTEINUR 30 (A) 09/20/2015 1355   UROBILINOGEN 1.0 10/13/2014 0027   NITRITE NEGATIVE 09/20/2015 1355   LEUKOCYTESUR SMALL (A) 09/20/2015 1355   LEUKOCYTESUR Negative 04/02/2012 2200     Microbiology No results found for this or any previous visit (from the past 240 hour(s)).     Inpatient Medications:   Scheduled Meds: . ARIPiprazole  2 mg Oral QHS  . gabapentin  800 mg Oral TID  . Influenza vac split quadrivalent PF  0.5 mL Intramuscular Tomorrow-1000  . lidocaine  1 patch Transdermal Q24H  . nicotine  21 mg Transdermal  Daily  . pantoprazole  40 mg Oral Daily  . PARoxetine  20 mg Oral Daily  . prazosin  4 mg Oral QHS  . traZODone  75 mg Oral QHS   Continuous Infusions:   Radiological Exams on Admission: No results found.  Impression/Recommendations Principal Problem:   Major depressive disorder, recurrent severe without psychotic features ([place]) Active Problems:   PTSD (post-traumatic stress disorder)   Borderline personality disorder  This is a 36 year old female, with a major depressive disorder and psychotic features who was found to have abnormal thyroid function testing. On the physical examination blood pressure is 124/74, heart rate 89, temperature 97.9, respiration 16. She is obese, well-hydrated, there is no tremors or significant skin changes. Blood work from January 10, sodium 141, potassium 3.55, bicarbonate 28, glucose 119, BUN 16, creatinine 0.74, white count 7.9, Hb 14.2, hematocrit 31.7, platelets 232. TSH 5.549, free T4 0.57. Pregnancy test negative.   Working diagnosis, abnormal thyroid function testing  1. Abormal thyroid testing. TSH noted to be elevated at 5,549, mildly elevated above the upper limit of normal, it is questionable if her symptoms (weight gain, decreased energy, daytime sleepiness )are related to hypothyroid or her  psychiatric condition. Questionable benefit of thyroid replacement therapy. Also note that obesity can give a higher TSH level. Considering patient's clinical scenario it will be reasonable to start a low dose of levothyroxine and assess patient response, having a low threshold to discontinue medication if no clinical improvement.     Thank you for this consultation.  Our [facility] hospitalist team will follow the patient with you.    Mauricio Gerome Apley M.D. [facility] 11/11/2016, 12:00 PM

## 2016-11-11 NOTE — Progress Notes (Signed)
D    Pt is pleasant on approach and cooperative    She said she went to karaoke group and had a good time   She said it was the first time she had ever done karaoke    She continues to use her walker but did not complain of any foot pain   She is socializing with her peers and has been appropriate  She does endorse some depression and anxiety but feels her mood improved after group tonight A    Verbal support given   Medications administered and effectiveness monitored    Q 15 min checks  R  Pt is safe and receptive to verbal support

## 2016-11-12 LAB — T3, FREE: T3 FREE: 3.1 pg/mL (ref 2.0–4.4)

## 2016-11-12 NOTE — Progress Notes (Signed)
D: Pt presents with flat affect and depressed mood. Pt reports ongoing depression and anxiety 8/10. Pt endorses AVH. Pt reported "it's normal for me to hear voices and see things because I have split personalities". Pt reported poor sleep due to having nightmares at bedtime. Pt HI towards her ex husband. Pt stated "I'm not going to hurt anyone". Pt denies suicidal thoughts. Pt verbally contracts for safety. Pt ambulates with walker due to calluses on feet. A: Orders reviewed by Probation officer. Medications administered as ordered per MD. Verbal support provided. Pt encouraged to attend groups. 15 minute checks performed for safety.  R: Pt receptive to tx. Pt compliant with tx.

## 2016-11-12 NOTE — Progress Notes (Signed)
D    Pt is pleasant on approach and cooperative    She attends groups and interaacts appropriately with others   She continues to use her walker but did not complain of any foot pain   She is socializing with her peers and has been appropriate  She does endorse some depression and anxiety but feels her mood improved after group tonight A    Verbal support given   Medications administered and effectiveness monitored    Q 15 min checks  R  Pt is safe and receptive to verbal support

## 2016-11-12 NOTE — Progress Notes (Signed)
Adult Psychoeducational Group Note  Date:  11/12/2016 Time:  9:09 PM  Group Topic/Focus:  Wrap-Up Group:   The focus of this group is to help patients review their daily goal of treatment and discuss progress on daily workbooks.  Participation Level:  Active  Participation Quality:  Appropriate  Affect:  Appropriate  Cognitive:  Alert  Insight: Appropriate  Engagement in Group:  Engaged  Modes of Intervention:  Discussion  Additional Comments:  Pt stated that she had an amazing day. She feels confident about going home.Her goal is to try and not allow things to get the best of her.  Suzanne Klein 11/12/2016, 9:09 PM

## 2016-11-12 NOTE — Plan of Care (Signed)
Problem: Activity: Goal: Interest or engagement in leisure activities will improve Outcome: Progressing Pt attends most groups and participates Goal: Imbalance in normal sleep/wake cycle will improve Outcome: Progressing Pt sleep has improved

## 2016-11-12 NOTE — [facility] Group Notes (Signed)
[place] LCSW Group Therapy 11/12/2016 1:15pm  Type of Therapy: Group Therapy- Feelings Around Relapse and Recovery  Pt did not attend, declined invitation.    Adriana Reams, LCSW [phone] 11/12/2016 5:03 PM

## 2016-11-12 NOTE — Progress Notes (Signed)
[facility] MD Progress Note  11/12/2016 3:30 PM Suzanne Klein  MRN:  [id number]  Subjective: Patient endorses that she is doing well today. She states she denies nightmares associated with her history of PTSD last night. Denies any medication side effects & specifically denies any lightheadedness or dizziness from Minipress management . Denies suicidal ideations. Will be discharged in am.   Objective:  I have reviewed chart notes and have met with patient . Patient is presenting with improving mood and range of affect, but states she continues to feel some depression and anxiety. Denies any suicidal ideations, but stresses she is still significantly depressed, anxious and  does not feel ready to discharge yet As above, has reported persistent nightmares, which she associates to PTSD. She does not feel these are related to Desyrel side effect, as she has had these nightmares " for a long time". No disruptive or agitated behaviors on unit. Denies medication side effects. TSH is mildly elevated, and FT4 is decreased ( 0.57) - patient reports potential symptoms of hypothyroidism- low energy, weight gain/difficulty losing weight/depression. Has been seen by an internal medicine & started on Levothyroxine 25 mcg daily.  Principal Problem: Major depressive disorder, recurrent severe without psychotic features ([place])  Diagnosis:   Patient Active Problem List   Diagnosis Date Noted  . Major depressive disorder, recurrent severe without psychotic features ([place]) [F33.2] 11/05/2016  . Schizoaffective disorder ([place]) [F25.9] 11/04/2016  . Major depressive disorder, recurrent, severe without psychotic features ([place]) [F33.2]   . 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 Pharyngitis [J02.9] 08/21/2013  . Throat pain [R07.0] 08/21/2013  . Rash [R21] 08/21/2013  . Cervical insufficiency in pregnancy, antepartum [O34.30] 07/06/2013  . Abnormal quad screen [O28.0] 07/06/2013   . PTSD (post-traumatic stress disorder) [F43.10] 06/29/2013  . Anxiety [F41.9] 06/29/2013  . Borderline personality disorder [F60.3] 06/29/2013  . Supervision of other normal pregnancy [Z34.80] 05/21/2013  . Previous cesarean section [Z98.891] 05/21/2013  . Tobacco smoking complicating pregnancy [L79.892] 05/21/2013   Total Time spent with patient: 20 minutes   Past Psychiatric History: PTSD, MDD  Past Medical History:  Past Medical History:  Diagnosis Date  . Abnormal Pap smear   . Borderline personality disorder    No meds  . Heartburn in pregnancy   . HPV in female   . HPV in female   . Migraines   . Panic anxiety syndrome    no meds  . PTSD (post-traumatic stress disorder)    No meds  . Scoliosis of lumbar spine   . Termination of pregnancy    x 1    Past Surgical History:  Procedure Laterality Date  . CESAREAN SECTION     x 2  . CESAREAN SECTION  07/21/2012   Procedure: CESAREAN SECTION;  Surgeon: Frederico Hamman, MD;  Location: [facility];  Service: Obstetrics;  Laterality: N/A;  Repeat Cesarean Section Delivery Boy @ [phone], Apgars 9/9  . CESAREAN SECTION    . COLPOSCOPY    . DILATION AND CURETTAGE OF UTERUS    . WISDOM TOOTH EXTRACTION     Family History:  Family History  Problem Relation Age of Onset  . Cancer Mother   . Heart disease Mother   . Dementia Maternal Grandfather   . Anesthesia problems Neg Hx    Family Psychiatric  History: See H&P  Social History:  History  Alcohol Use  . Yes     History  Drug Use No    Social History  Social History  . Marital status: Married    Spouse name: N/A  . Number of children: N/A  . Years of education: N/A   Social History Main Topics  . Smoking status: Current Every Day Smoker    Packs/day: 0.15    Years: 13.00    Types: Cigarettes  . Smokeless tobacco: Never Used  . Alcohol use Yes  . Drug use: No  . Sexual activity: Yes    Birth control/ protection: None     Comment: pregnant   Other  Topics Concern  . None   Social History Narrative   ** Merged History Encounter **       Additional Social History:   Sleep: Good  Appetite: improved   Current Medications: Current Facility-Administered Medications  Medication Dose Route Frequency Provider Last Rate Last Dose  . acetaminophen (TYLENOL) tablet 650 mg  650 mg Oral Q6H PRN Patrecia Pour, NP   650 mg at 11/10/16 1057  . alum & mag hydroxide-simeth (MAALOX/MYLANTA) 200-200-20 MG/5ML suspension 30 mL  30 mL Oral Q4H PRN Patrecia Pour, NP   30 mL at 11/08/16 1529  . ARIPiprazole (ABILIFY) tablet 2 mg  2 mg Oral QHS Ursula Alert, MD   2 mg at 11/11/16 2143  . gabapentin (NEURONTIN) capsule 800 mg  800 mg Oral TID Jenne Campus, MD   800 mg at 11/12/16 1107  . hydrOXYzine (ATARAX/VISTARIL) tablet 25 mg  25 mg Oral TID PRN Ursula Alert, MD   25 mg at 11/12/16 1108  . Influenza vac split quadrivalent PF (FLUARIX) injection 0.5 mL  0.5 mL Intramuscular Tomorrow-1000 Rozetta Nunnery, NP      . levothyroxine (SYNTHROID, LEVOTHROID) tablet 25 mcg  25 mcg Oral QAC breakfast Tawni Millers, MD   25 mcg at 11/12/16 [phone]  . lidocaine (LIDODERM) 5 % 1 patch  1 patch Transdermal Q24H Jenne Campus, MD   1 patch at 11/12/16 1106  . magnesium hydroxide (MILK OF MAGNESIA) suspension 30 mL  30 mL Oral Daily PRN Patrecia Pour, NP   30 mL at 11/08/16 1042  . nicotine (NICODERM CQ - dosed in mg/24 hours) patch 21 mg  21 mg Transdermal Daily Jenne Campus, MD   21 mg at 11/12/16 1107  . ondansetron (ZOFRAN-ODT) disintegrating tablet 4 mg  4 mg Oral Q8H PRN Derrill Center, NP   4 mg at 11/08/16 1306  . pantoprazole (PROTONIX) EC tablet 40 mg  40 mg Oral Daily Jenne Campus, MD   40 mg at 11/12/16 0747  . PARoxetine (PAXIL) tablet 20 mg  20 mg Oral Daily Ursula Alert, MD   20 mg at 11/12/16 0746  . prazosin (MINIPRESS) capsule 4 mg  4 mg Oral QHS Jenne Campus, MD   4 mg at 11/11/16 2143  . traZODone (DESYREL) tablet 75  mg  75 mg Oral QHS Jenne Campus, MD   75 mg at 11/11/16 2143   Lab Results:  Results for orders placed or performed during the hospital encounter of 11/05/16 (from the past 48 hour(s))  T3, free     Status: None   Collection Time: 11/11/16  6:11 AM  Result Value Ref Range   T3, Free 3.1 2.0 - 4.4 pg/mL    Comment: (NOTE) Performed At: [facility] [place], [place] [id number] Lindon Romp MD QP:[id number] Performed at [facility], [place] [address] [place], [place] [address]   T4,  free     Status: Abnormal   Collection Time: 11/11/16  6:11 AM  Result Value Ref Range   Free T4 0.57 (L) 0.61 - 1.12 ng/dL    Comment: (NOTE) Biotin ingestion may interfere with free T4 tests. If the results are inconsistent with the TSH level, previous test results, or the clinical presentation, then consider biotin interference. If needed, order repeat testing after stopping biotin. Performed at [facility], [place] [address] [place], [place] [address]    Blood Alcohol level:  Lab Results  Component Value Date   [facility] <5 11/03/2016   ETH <5 25/36/6440   Metabolic Disorder Labs: Lab Results  Component Value Date   HGBA1C 5.7 (H) 11/08/2016   MPG 117 11/08/2016   Lab Results  Component Value Date   PROLACTIN 21.8 11/08/2016   Lab Results  Component Value Date   CHOL 167 11/08/2016   TRIG 162 (H) 11/08/2016   HDL 37 (L) 11/08/2016   CHOLHDL 4.5 11/08/2016   VLDL 32 11/08/2016   LDLCALC 98 11/08/2016   Physical Findings: AIMS: Facial and Oral Movements Muscles of Facial Expression: None, normal Lips and Perioral Area: None, normal Jaw: None, normal Tongue: None, normal,Extremity Movements Upper (arms, wrists, hands, fingers): None, normal Lower (legs, knees, ankles, toes): None, normal, Trunk Movements Neck, shoulders, hips: None, normal, Overall Severity Severity of abnormal movements (highest score from questions above): None,  normal Incapacitation due to abnormal movements: None, normal Patient's awareness of abnormal movements (rate only patient's report): No Awareness, Dental Status Current problems with teeth and/or dentures?: No Does patient usually wear dentures?: No  CIWA:  CIWA-Ar Total: 3 COWS:  COWS Total Score: 2  Musculoskeletal: Strength & Muscle Tone: within normal limits Gait & Station: normal- ambulation slowed by bilateral leg pain Patient leans: N/A  Psychiatric Specialty Exam: Physical Exam  Nursing note and vitals reviewed. Constitutional: She is oriented to person, place, and time.  Neurological: She is alert and oriented to person, place, and time.  Psychiatric: She has a normal mood and affect. Her behavior is normal.    ROS denies headache, no chest pain, no shortness of breath, no vomiting,  Reports chronic pain affecting mostly knees , lower extremities   Blood pressure (!) 123/56, pulse 87, temperature 97.8 F (36.6 C), temperature source Oral, resp. rate 18, height _0  (1.676 m), weight 95.3 kg (210 lb), last menstrual period 11/03/2016, SpO2 99 %.Body mass index is 33.89 kg/m.  General Appearance: improving  Eye Contact:  Improved    Speech:  normal  Volume:  Normal  Mood:  Gradually improving but still depressed , anxious  Affect: mildly constricted, anxious    Thought Process:  Linear   Orientation:  Full (Time, Place, and Person)  Thought Content:  Denies hallucinations, no delusions, not internally preoccupied   Suicidal Thoughts:  No denies suicidal ideations, denies any self injurious ideations   Homicidal Thoughts:  No  Memory:  Recent and remote grossly intact   Judgement:  Improving   Insight: improving   Psychomotor Activity:  Normal  Concentration:  Concentration: Good and Attention Span: Good  Recall:  Good  Fund of Knowledge:  Good  Language:  Good  Akathisia:  No  Handed:  Right  AIMS (if indicated):     Assets:  Desire for  Improvement Intimacy Talents/Skills  ADL's:  Intact  Cognition:  WNL  Sleep:  Number of Hours: 6.75   Assessment - partial improvement compared to admission ,  mood is improving, but anxiety is more persistent. No suicidal ideations.She endorses ongoing nightmares, partially improved but still subjectively distressing , in spite of Minipress titration, which thus far has not caused any side effects. TSH mildly elevated and T4 is low.  Treatment Plan Summary: Daily contact with patient to assess and evaluate symptoms and progress in treatment and Medication management Encourage group and milieu participation to work on coping skills and symptom reduction Treatment team working on disposition planning    For Depressive /affective sx:  Will Contiune Paxil 20 mg QDAY   For Psychosis: Will Continue Abilify 2 mg QHS   For Insomnia: Continue Trazodone  75 mg QHS  For nightmares: Continue Prazosin to 4 mg QHS- side effects reviewed   For anxiety/agitation, and pain: Changed Neurontin to 800 mgrs TID  For GERD: Will continue Protonix 40 mgrs QDAY  For abnormal Thyroid Function Tests/ Hypothyroidism: Have consulted hospitalist to discuss starting management, seen by an internal medicine, started on Levothyroxine 25 mg daily.  Treatment team working on disposition planning options   Encarnacion Slates, NP, PMHNP, FNP-BC 11/12/2016, 3:30 PM  Patient ID: Suzanne Klein, female   DOB: 1980-11-08, 36 y.o.   MRN: [id number]

## 2016-11-12 NOTE — Progress Notes (Cosign Needed)
Adult Psychoeducational Group Note  Date:  11/12/2016 Time:  10:27 AM  Group Topic/Focus:  Healthy Communication:   The focus of this group is to discuss communication, barriers to communication, as well as healthy ways to communicate with others.  Participation Level:  Active  Participation Quality:  Appropriate  Affect:  Appropriate  Cognitive:  Appropriate  Insight: Appropriate  Engagement in Group:  Improving  Modes of Intervention:  Discussion  Additional Comments:  Pt did attend group this morning.  Pt states that she is feeling a lot better than she was days ago, she states that the medication is really starting to work.  Izel Eisenhardt R Jovan Colligan 11/12/2016, 10:27 AM

## 2016-11-13 MED ORDER — PAROXETINE HCL 20 MG PO TABS: 20.0000 mg | ORAL_TABLET | Freq: Every day | ORAL | 0 refills | Status: DC

## 2016-11-13 MED ORDER — PRAZOSIN HCL 2 MG PO CAPS: 4.0000 mg | ORAL_CAPSULE | Freq: Every day | ORAL | 0 refills | Status: AC

## 2016-11-13 MED ORDER — TRAZODONE HCL 150 MG PO TABS: 75.0000 mg | ORAL_TABLET | Freq: Every day | ORAL | 0 refills | Status: DC

## 2016-11-13 MED ORDER — ARIPIPRAZOLE 2 MG PO TABS: 2.0000 mg | ORAL_TABLET | Freq: Every day | ORAL | 0 refills | Status: DC

## 2016-11-13 MED ORDER — PANTOPRAZOLE SODIUM 40 MG PO TBEC: 40.0000 mg | DELAYED_RELEASE_TABLET | Freq: Every day | ORAL | 0 refills | Status: DC

## 2016-11-13 MED ORDER — HYDROXYZINE HCL 25 MG PO TABS: 25.0000 mg | ORAL_TABLET | Freq: Three times a day (TID) | ORAL | 0 refills | Status: DC | PRN

## 2016-11-13 MED ORDER — GABAPENTIN 400 MG PO CAPS: 800.0000 mg | ORAL_CAPSULE | Freq: Three times a day (TID) | ORAL | 0 refills | Status: DC

## 2016-11-13 MED ORDER — LEVOTHYROXINE SODIUM 25 MCG PO TABS: 25.0000 ug | ORAL_TABLET | Freq: Every day | ORAL | 0 refills | Status: DC

## 2016-11-13 MED ORDER — NICOTINE 21 MG/24HR TD PT24: 21.0000 mg | MEDICATED_PATCH | Freq: Every day | TRANSDERMAL | 0 refills | Status: DC

## 2016-11-13 NOTE — Progress Notes (Signed)
Adult Psychoeducational Group Note  Date:  11/13/2016 Time:  0915  Group Topic/Focus:  Coping With Mental Health Crisis:   The purpose of this group is to help patients identify strategies for coping with mental health crisis.  Group discusses possible causes of crisis and ways to manage them effectively.  Participation Level:  Active  Participation Quality:  Appropriate  Affect:  Appropriate  Cognitive:  Appropriate  Insight: Appropriate  Engagement in Group:  Engaged  Modes of Intervention:  Discussion and Education  Additional Comments:    Khloee Garza L 11/13/2016, 2:35 PM

## 2016-11-13 NOTE — [facility] Group Notes (Signed)
[facility] Group Notes: (Clinical Social Work)   11/13/2016      Type of Therapy:  Group Therapy   Participation Level:  Did Not Attend - discharged  Selmer Dominion, LCSW 11/13/2016, 4:31 PM
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
## 2016-11-13 NOTE — Progress Notes (Signed)
Discharge note: Pt received both written and verbal discharge instructions. Pt verbalized understanding of med regimen. Pt received prescriptions, AVS, SRA and transition record. Pt gathered belongings from room and locker. Pt safely discharged to lobby.

## 2016-11-13 NOTE — Plan of Care (Signed)
Problem: Coping: Goal: Ability to cope will improve Outcome: Progressing Pt coping skills have improved  Problem: Medication: Goal: Compliance with prescribed medication regimen will improve Outcome: Progressing Pt is compliant with medications
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
## 2016-11-13 NOTE — Progress Notes (Signed)
  [facility] Adult Case Management Discharge Plan :  Will you be returning to the same living situation after discharge:  Yes,  with husband At discharge, do you have transportation home?: Yes,  bus pass provided Do you have the ability to pay for your medications: Yes,  has Medicaid  Release of information consent forms completed and turned in:  Patient to Follow up at: Follow-up Information    [facility] Follow up.   Specialty:  Behavioral Health Why:  Therapist appointment: 11-18-16 at 3:30pm Medication Management appointment: 11-25-16 at 1:40pm  Contact information: [place] [address] [phone]           Next level of care provider has access to [place] and Suicide Prevention discussed: Yes,  with husband  Have you used any form of tobacco in the last 30 days? (Cigarettes, Smokeless Tobacco, Cigars, and/or Pipes): Yes  Has patient been referred to the Quitline?: Patient refused referral  Patient has been referred for addiction treatment: N/A  Maretta Los 11/13/2016, 1:21 PM

## 2017-01-25 ENCOUNTER — Emergency Department (HOSPITAL_COMMUNITY)
Admission: EM | Admit: 2017-01-25 | Discharge: 2017-01-25 | Disposition: A | Discharge status: Home / Self Care | Payer: Medicaid Other | Attending: Emergency Medicine | Admitting: Emergency Medicine

## 2017-01-25 ENCOUNTER — Encounter (HOSPITAL_COMMUNITY): Payer: Self-pay | Admitting: Emergency Medicine

## 2017-01-25 DIAGNOSIS — Z91040 Latex allergy status: Secondary | ICD-10-CM | POA: Insufficient documentation

## 2017-01-25 DIAGNOSIS — Z79899 Other long term (current) drug therapy: Secondary | ICD-10-CM | POA: Diagnosis not present

## 2017-01-25 DIAGNOSIS — F419 Anxiety disorder, unspecified: Secondary | ICD-10-CM | POA: Diagnosis not present

## 2017-01-25 DIAGNOSIS — F17210 Nicotine dependence, cigarettes, uncomplicated: Secondary | ICD-10-CM | POA: Insufficient documentation

## 2017-01-25 DIAGNOSIS — Z760 Encounter for issue of repeat prescription: Secondary | ICD-10-CM | POA: Insufficient documentation

## 2017-01-25 MED ORDER — ARIPIPRAZOLE 2 MG PO TABS: 2.0000 mg | ORAL_TABLET | Freq: Every day | ORAL | 0 refills | Status: DC

## 2017-01-25 MED ORDER — LEVOTHYROXINE SODIUM 50 MCG PO TABS: 25.0000 ug | ORAL_TABLET | Freq: Every day | ORAL | 0 refills | Status: DC

## 2017-01-25 MED ORDER — GABAPENTIN 400 MG PO CAPS: 400.0000 mg | ORAL_CAPSULE | Freq: Two times a day (BID) | ORAL | 0 refills | Status: DC

## 2017-01-25 MED ORDER — HYDROXYZINE HCL 25 MG PO TABS: 25.0000 mg | ORAL_TABLET | Freq: Four times a day (QID) | ORAL | 0 refills | Status: DC

## 2017-01-25 MED ORDER — PAROXETINE HCL 20 MG PO TABS: 20.0000 mg | ORAL_TABLET | Freq: Every day | ORAL | 0 refills | Status: DC

## 2017-01-25 MED ORDER — LORAZEPAM 1 MG PO TABS
1.0000 mg | ORAL_TABLET | Freq: Once | ORAL | Status: AC
  Administered 2017-01-25: 1 mg via ORAL
  Filled 2017-01-25: qty 1

## 2017-01-25 NOTE — Discharge Instructions (Signed)
I have provided you with a short course of your medications to help you with the next few days. Please contact your therapist and clinicians at [facility] to get your prescriptions filled. Call them tomorrow and arrange for a follow-up appointment.   Please return to the Emergency Dept for any thoughts of harming yourself or anybody else, for any increased anxiety for any other worsening or concerning symptoms.   I have provided you a list of clinics below that will be available to see you.   RESOURCE GUIDE  Dental Problems  Patients with Medicaid: [place] Phone:  [phone]                                                  Phone:  [phone]  If unable to pay or uninsured, contact:  [facility] or [facility]. to become qualified for the adult dental clinic.  Chronic Pain Problems Contact [facility]  [phone] Patients need to be referred by their primary care doctor.  Insufficient Money for Medicine Contact [employer]:  call "211" or [place] [phone].  No Primary Care Doctor Call [facility]  [phone] Other agencies that provide inexpensive medical care    [place]  [phone]    [facility]  [place]  [phone]    [facility]  [phone]    [facility]  [place]  [phone] [place]   [phone] (emergency services [phone])  Substance [facility]  [phone] [facility] [phone] [place] [phone] [facility] [phone] Residential & Outpatient Substance Abuse Program  [phone]  Abuse/Neglect [place] [phone] [place] [phone] (After Hours)  Emergency [place] [phone]  [facility] [place] [phone] [place] [phone]  MRSA Hotline #:   (309)476-5811    [facility] Dept. [address]                       [address]      [address] [place]  [facility] Phone:  [phone]                                   Phone:  [phone]                 Phone:  [place] Phone:  [place] [phone] (After Hours)

## 2017-01-25 NOTE — ED Provider Notes (Signed)
[place] [facility] DEPT Provider Note   CSN: [id number] Arrival date & time: 01/25/17  1059   By signing my name below, I, Neta Mends, attest that this documentation has been prepared under the direction and in the presence of Providence Lanius, [place]. Electronically Signed: Neta Mends, ED Scribe. 01/25/2017. 1:11 PM.   History   Chief Complaint Chief Complaint  Patient presents with  . Anxiety  . Medication Refill    The history is provided by the patient. No language interpreter was used.   HPI Comments:  Suzanne Klein is a 36 y.o. female who presents to the Emergency Department complaining of worsening anxiety 4 days. Pt states that she has been out of all of her psychiatric medications as of 4 days ago, and is requesting refills. She notes that she ran out because her pills spilled out into a bag and got wet. Pt complains of associated SOB, chest tightness, nausea, diarrhea and states that symptoms feel consistent with her past episodes of panic attacks. She reports increasing stress because her husband has had 18 seizures in the past 2 days and is in the hospital. Pt is seen at [facility] for mental health. She has contacted them about refilling her medications and she is supposed to get them refilled in the next few days. Pt smokes 1 pack/day, and denies illicit drug use. Pt denies SI/HI, fever, chills, palpitations.   Past Medical History:  Diagnosis Date  . Abnormal Pap smear   . Borderline personality disorder    No meds  . Heartburn in pregnancy   . HPV in female   . HPV in female   . Migraines   . Panic anxiety syndrome    no meds  . PTSD (post-traumatic stress disorder)    No meds  . Scoliosis of lumbar spine   . Termination of pregnancy    x 1    Patient Active Problem List   Diagnosis Date Noted  . Major depressive disorder, recurrent severe without psychotic features ([place]) 11/05/2016  . Schizoaffective disorder ([place]) 11/04/2016  . Major depressive  disorder, recurrent, severe without psychotic features ([place])   . Posttraumatic stress disorder with dissociative symptoms 11/01/2014  . Pharyngitis 08/21/2013  . Throat pain 08/21/2013  . Rash 08/21/2013  . Cervical insufficiency in pregnancy, antepartum 07/06/2013  . Abnormal quad screen 07/06/2013  . PTSD (post-traumatic stress disorder) 06/29/2013  . Anxiety 06/29/2013  . Borderline personality disorder 06/29/2013  . Supervision of other normal pregnancy 05/21/2013  . Previous cesarean section 05/21/2013  . Tobacco smoking complicating pregnancy 08/18/8526    Past Surgical History:  Procedure Laterality Date  . CESAREAN SECTION     x 2  . CESAREAN SECTION  07/21/2012   Procedure: CESAREAN SECTION;  Surgeon: Frederico Hamman, MD;  Location: [facility];  Service: Obstetrics;  Laterality: N/A;  Repeat Cesarean Section Delivery Boy @ [phone], Apgars 9/9  . CESAREAN SECTION    . COLPOSCOPY    . DILATION AND CURETTAGE OF UTERUS    . WISDOM TOOTH EXTRACTION      OB History    Gravida Para Term Preterm AB Living   5 3 2 1 1 3    SAB TAB Ectopic Multiple Live Births   0 1 0 0 2       Home Medications    Prior to Admission medications   Medication Sig Start Date End Date Taking? Authorizing Provider  ARIPiprazole (ABILIFY) 2 MG tablet Take 1 tablet (2 mg total) by  mouth daily. 01/25/17   Volanda Napoleon, PA-C  gabapentin (NEURONTIN) 400 MG capsule Take 1 capsule (400 mg total) by mouth 2 (two) times daily. 01/25/17   Volanda Napoleon, PA-C  hydrOXYzine (ATARAX/VISTARIL) 25 MG tablet Take 1 tablet (25 mg total) by mouth every 6 (six) hours. 01/25/17   Volanda Napoleon, PA-C  levothyroxine (SYNTHROID, LEVOTHROID) 50 MCG tablet Take 0.5 tablets (25 mcg total) by mouth daily before breakfast. 01/25/17   Volanda Napoleon, PA-C  nicotine (NICODERM CQ - DOSED IN MG/24 HOURS) 21 mg/24hr patch Place 1 patch (21 mg total) onto the skin daily. 11/14/16   Derrill Center, NP  pantoprazole (PROTONIX) 40 MG  tablet Take 1 tablet (40 mg total) by mouth daily. 11/14/16   Derrill Center, NP  PARoxetine (PAXIL) 20 MG tablet Take 1 tablet (20 mg total) by mouth daily. 01/25/17   Volanda Napoleon, PA-C  prazosin (MINIPRESS) 2 MG capsule Take 2 capsules (4 mg total) by mouth at bedtime. 11/13/16   Derrill Center, NP  traZODone (DESYREL) 150 MG tablet Take 0.5 tablets (75 mg total) by mouth at bedtime. 11/13/16   Derrill Center, NP    Family History Family History  Problem Relation Age of Onset  . Cancer Mother   . Heart disease Mother   . Dementia Maternal Grandfather   . Anesthesia problems Neg Hx     Social History Social History  Substance Use Topics  . Smoking status: Current Every Day Smoker    Packs/day: 0.15    Years: 13.00    Types: Cigarettes  . Smokeless tobacco: Never Used  . Alcohol use Yes     Allergies   Poison oak extract [poison oak extract]; Banana; Latex; Poison ivy extract [poison ivy extract]; and Tomato   Review of Systems Review of Systems  Constitutional: Negative for chills and fever.  Respiratory: Positive for chest tightness and shortness of breath. Negative for cough.   Cardiovascular: Negative for chest pain and palpitations.  Gastrointestinal: Positive for diarrhea and nausea. Negative for abdominal pain, constipation and vomiting.  Genitourinary: Negative for dysuria and hematuria.  Neurological: Negative for headaches.  Psychiatric/Behavioral: Negative for suicidal ideas. The patient is nervous/anxious.   All other systems reviewed and are negative.    Physical Exam Updated Vital Signs BP 131/62 (BP Location: Right Arm)   Pulse 84   Temp 98.2 F (36.8 C) (Oral)   Resp 16   SpO2 99%   Physical Exam  Constitutional: She appears well-developed and well-nourished.  HENT:  Head: Normocephalic and atraumatic.  Eyes: Conjunctivae and EOM are normal. Right eye exhibits no discharge. Left eye exhibits no discharge. No scleral icterus.  Cardiovascular:  Normal rate, regular rhythm and intact distal pulses.   Pulmonary/Chest: Effort normal.  Musculoskeletal: She exhibits no deformity.  Neurological: She is alert.  Skin: Skin is warm and dry.  Psychiatric: Her speech is normal and behavior is normal. Her mood appears anxious.  Appears very anxious throughout exam. Will intermittently and repeatedly tap her leg against the chair     ED Treatments / Results  DIAGNOSTIC STUDIES:  Oxygen Saturation is 99% on RA, normal by my interpretation.    COORDINATION OF CARE:    Labs (all labs ordered are listed, but only abnormal results are displayed) Labs Reviewed - No data to display  EKG  EKG Interpretation None       Radiology No results found.  Procedures Procedures (including critical care time)  Medications Ordered in ED Medications  LORazepam (ATIVAN) tablet 1 mg (1 mg Oral Given 01/25/17 1318)     Initial Impression / Assessment and Plan / ED Course  I have reviewed the triage vital signs and the nursing notes.  Pertinent labs & imaging results that were available during my care of the patient were reviewed by me and considered in my medical decision making (see chart for details).    36 yo patient presents to the emergency department complaining of symptoms consistent with anxiety. She has run out of her psychiatric medications.  Review of records show that patient has a history of similar symptoms. Patient appears nervous and anxious throughout exam. She has had recent stressors due to husband's medical condition. Has been in contact with her psychiatric service and they are planning to get her meds within the next few days. However, she feels like because of the stress that has been going on, she cannot wait. Will give dose of ativan while in the ED to help acutely with anxiety.   Re-eval: Patient appears calm and in no acute distress after Ativan. Still denies any SI/HI. She reports feeling improved. Explained to patient  that I will provide her a short course of her medications to help with acute anxiety until she is able to refill her prescriptions. Explained that I will not fill the Trazodone. Instructed patient to call [facility] today or tomorrow to arrange to be seen as soon as possible as she needs close follow-up. Provided patient with a list of resources that she can use for follow-up if she is not able to be seen by her therapists.  Return precautions discussed. Patient expresses understanding and agreement to plan.    Final Clinical Impressions(s) / ED Diagnoses   Final diagnoses:  Anxiety    New Prescriptions Discharge Medication List as of 01/25/2017  1:49 PM    I personally performed the services described in this documentation, which was scribed in my presence. The recorded information has been reviewed and is accurate.      Volanda Napoleon, PA-C 01/25/17 1746    Pattricia Boss, MD 01/25/17 1945

## 2017-01-25 NOTE — ED Notes (Signed)
Pt ambulatory in and out of the ED. No distress noted. Updated on bed status. Pt understanding.

## 2017-01-25 NOTE — ED Triage Notes (Signed)
Pt sts increased anxiety today and sts out of home meds x 4 days

## 2017-09-22 ENCOUNTER — Other Ambulatory Visit (HOSPITAL_BASED_OUTPATIENT_CLINIC_OR_DEPARTMENT_OTHER): Payer: Self-pay

## 2017-09-22 ENCOUNTER — Encounter: Payer: Self-pay | Admitting: Nurse Practitioner

## 2017-09-22 DIAGNOSIS — R5383 Other fatigue: Secondary | ICD-10-CM

## 2017-09-22 DIAGNOSIS — R0683 Snoring: Secondary | ICD-10-CM

## 2017-09-30 ENCOUNTER — Ambulatory Visit (INDEPENDENT_AMBULATORY_CARE_PROVIDER_SITE_OTHER): Payer: Medicaid Other | Admitting: Nurse Practitioner

## 2017-09-30 ENCOUNTER — Encounter: Payer: Self-pay | Admitting: Nurse Practitioner

## 2017-09-30 VITALS — BP 128/74 | Ht 66.0 in | Wt 257.4 lb

## 2017-09-30 DIAGNOSIS — R197 Diarrhea, unspecified: Secondary | ICD-10-CM | POA: Diagnosis not present

## 2017-09-30 DIAGNOSIS — R10813 Right lower quadrant abdominal tenderness: Secondary | ICD-10-CM | POA: Diagnosis not present

## 2017-09-30 DIAGNOSIS — K219 Gastro-esophageal reflux disease without esophagitis: Secondary | ICD-10-CM

## 2017-09-30 DIAGNOSIS — K529 Noninfective gastroenteritis and colitis, unspecified: Secondary | ICD-10-CM

## 2017-09-30 MED ORDER — NA SULFATE-K SULFATE-MG SULF 17.5-3.13-1.6 GM/177ML PO SOLN: ORAL | 0 refills | Status: DC

## 2017-09-30 NOTE — Progress Notes (Signed)
Chief Complaint:  Chronic diarrhea  HPI:  Patient is a 36 year old female, new to this practice. She has a history of hypothyroidism, obesity , depression, anxiety / panic attacks, PTSD, borderline personality disorder. She is referred by Volney Presser, FNP for diarrhea. Patient used to struggle with chronic constipation but 3 years ago changed to having diarrhea. Very rarely does she have problems with constipation now. She has liquid stool about 3 times a day and sometimes stool contains a scant amount of blood. She has tried changing her diet and avoiding certain foods. Milk definitely leads to worsening diarrhea. Even with a bland diet her stools are loose. Her weight fluctuates. She has no abdominal pain unless trying to pass a hard stool which is a rare occurrence. No nausea or vomiting. A great uncle had colon cancer, no history of colon cancer in primary relatives.  Patient has a long-standing history of GERD. She took over-the-counter medication for years then last month was started on Prilosec. Since starting a PPI she has been asymptomatic.  Past Medical History:  Diagnosis Date  . Abnormal Pap smear   . Borderline personality disorder    No meds  . Heartburn in pregnancy   . HPV in female   . HPV in female   . Migraines   . Panic anxiety syndrome    no meds  . PTSD (post-traumatic stress disorder)    No meds  . Scoliosis of lumbar spine   . Termination of pregnancy    x 1     Past Surgical History:  Procedure Laterality Date  . CESAREAN SECTION     x 2  . CESAREAN SECTION  07/21/2012   Procedure: CESAREAN SECTION;  Surgeon: Frederico Hamman, MD;  Location: [facility];  Service: Obstetrics;  Laterality: N/A;  Repeat Cesarean Section Delivery Boy @ [phone], Apgars 9/9  . CESAREAN SECTION    . COLPOSCOPY    . DILATION AND CURETTAGE OF UTERUS    . WISDOM TOOTH EXTRACTION     Family History  Problem Relation Age of Onset  . Cancer Mother   . Heart disease Mother   .  Dementia Maternal Grandfather   . Anesthesia problems Neg Hx    Social History   Tobacco Use  . Smoking status: Current Every Day Smoker    Packs/day: 0.15    Years: 13.00    Pack years: 1.95    Types: Cigarettes  . Smokeless tobacco: Never Used  Substance Use Topics  . Alcohol use: Yes  . Drug use: No   Current Outpatient Medications  Medication Sig Dispense Refill  . hydrOXYzine (ATARAX/VISTARIL) 25 MG tablet Take 1 tablet (25 mg total) by mouth every 6 (six) hours. 10 tablet 0  . levothyroxine (SYNTHROID, LEVOTHROID) 50 MCG tablet Take 0.5 tablets (25 mcg total) by mouth daily before breakfast. 5 tablet 0  . pantoprazole (PROTONIX) 40 MG tablet Take 1 tablet (40 mg total) by mouth daily. 30 tablet 0  . PARoxetine (PAXIL) 20 MG tablet Take 1 tablet (20 mg total) by mouth daily. 5 tablet 0  . prazosin (MINIPRESS) 2 MG capsule Take 2 capsules (4 mg total) by mouth at bedtime. 30 capsule 0  . traZODone (DESYREL) 150 MG tablet Take 0.5 tablets (75 mg total) by mouth at bedtime. 30 tablet 0  . gabapentin (NEURONTIN) 400 MG capsule Take 1 capsule (400 mg total) by mouth 2 (two) times daily. 8 capsule 0   No current facility-administered medications for  this visit.    Allergies  Allergen Reactions  . Poison Oak Extract [Poison Oak Extract] Anaphylaxis and Itching    Anaphylaxis occurs when the plant is burning    . Banana Nausea And Vomiting and Rash  . Latex Rash  . Poison Ivy Extract [Poison Ivy Extract] Rash  . Tomato Rash     Review of Systems: All systems reviewed and negative except where noted in HPI.    Physical Exam: BP 128/74   Ht 5\' 6"  (1.676 m)   Wt 257 lb 6.4 oz (116.8 kg)   LMP 09/22/2017 (Exact Date)   BMI 41.55 kg/m  Constitutional:  Obese white female in no acute distress. Psychiatric: Very pleasant. Normal mood and affect. Behavior is normal. EENT: Pupils normal.  Conjunctivae are normal. No scleral icterus. Neck supple.  Cardiovascular: Normal rate,  regular rhythm. No edema Pulmonary/chest: Effort normal and breath sounds normal. No wheezing, rales or rhonchi. Abdominal: Soft, nondistended. Mild RLQ tenderness. Bowel sounds active throughout. There are no masses palpable. No hepatomegaly. Lymphadenopathy: No cervical adenopathy noted. Neurological: Alert and oriented to person place and time. Skin: Skin is warm and dry. No rashes noted.   ASSESSMENT AND PLAN:  26. 36 year old female with three-year history of diarrhea. Occasionally has scant blood with bowel movement. 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  The risks and benefits of the procedure were discussed and the patient agrees to proceed.   2. Obesity, BMI 41.5  3. Anxiety / depression / bipolar disorder  4. Hypertriglyceridemia. Recent triglycerides 366.  5. History of condyloma, uses [facility]   Tye Savoy, NP  09/30/2017, 2:54 PM  Cc:  Volney Presser, FNP

## 2017-09-30 NOTE — Progress Notes (Signed)
Assessment and plans reviewed  

## 2017-09-30 NOTE — Patient Instructions (Signed)
If you are age 37 or older, your body mass index should be between 23-30. Your Body mass index is 41.55 kg/m. If this is out of the aforementioned range listed, please consider follow up with your Primary Care Provider.  If you are age 30 or younger, your body mass index should be between 19-25. Your Body mass index is 41.55 kg/m. If this is out of the aformentioned range listed, please consider follow up with your Primary Care Provider.   You have been scheduled for a colonoscopy. Please follow written instructions given to you at your visit today.  Please pick up your prep supplies at the pharmacy within the next 1-3 days. If you use inhalers (even only as needed), please bring them with you on the day of your procedure. Your physician has requested that you go to www.startemmi.com and enter the access code given to you at your visit today. This web site gives a general overview about your procedure. However, you should still follow specific instructions given to you by our office regarding your preparation for the procedure.  Your physician has requested that you go to the basement for the following lab work before leaving today: CBC IGA TTG  Thank you for choosing me and [facility].   Tye Savoy, NP

## 2017-10-03 ENCOUNTER — Encounter: Payer: Self-pay | Admitting: Nurse Practitioner

## 2017-10-04 ENCOUNTER — Encounter: Payer: Self-pay | Admitting: Internal Medicine

## 2017-10-05 ENCOUNTER — Telehealth: Payer: Self-pay | Admitting: Internal Medicine

## 2017-10-05 NOTE — Telephone Encounter (Signed)
I spoke with the patient and she states that she is "snowed in, and didn't get the message for her bloodwork until Monday.  Wants to know if she can still have her procedure tomorrow.  She is a little "iffy" about her medicaid ride for tomorrow. She said that she " will call us if her ride isn't running."

## 2017-10-05 NOTE — Telephone Encounter (Signed)
Patient called back in stating that she has to reschedule tomorrow's procedure due to lack of transporation. She rescheduled to 10/13/17

## 2017-10-05 NOTE — Telephone Encounter (Signed)
Noted. Patient seen by nurse practitioner in office

## 2017-10-06 ENCOUNTER — Encounter: Payer: Self-pay | Admitting: Internal Medicine

## 2017-10-08 ENCOUNTER — Ambulatory Visit (HOSPITAL_BASED_OUTPATIENT_CLINIC_OR_DEPARTMENT_OTHER): Payer: Medicaid Other | Attending: Specialist | Admitting: Internal Medicine

## 2017-10-08 VITALS — Ht 66.0 in | Wt 263.0 lb

## 2017-10-08 DIAGNOSIS — R0683 Snoring: Secondary | ICD-10-CM | POA: Diagnosis present

## 2017-10-08 DIAGNOSIS — E782 Mixed hyperlipidemia: Secondary | ICD-10-CM

## 2017-10-08 DIAGNOSIS — R5383 Other fatigue: Secondary | ICD-10-CM | POA: Insufficient documentation

## 2017-10-08 DIAGNOSIS — F3160 Bipolar disorder, current episode mixed, unspecified: Secondary | ICD-10-CM

## 2017-10-08 DIAGNOSIS — F4312 Post-traumatic stress disorder, chronic: Secondary | ICD-10-CM

## 2017-10-08 DIAGNOSIS — J019 Acute sinusitis, unspecified: Secondary | ICD-10-CM

## 2017-10-13 ENCOUNTER — Encounter: Payer: Medicaid Other | Admitting: Internal Medicine

## 2017-10-13 ENCOUNTER — Telehealth: Payer: Self-pay | Admitting: Internal Medicine

## 2017-10-22 DIAGNOSIS — G4730 Sleep apnea, unspecified: Secondary | ICD-10-CM

## 2017-10-22 DIAGNOSIS — R0683 Snoring: Secondary | ICD-10-CM | POA: Diagnosis not present

## 2017-10-28 NOTE — Procedures (Signed)
   Patient Name: Suzanne Klein, Suzanne Klein Date: 10/08/2017 Gender: Female D.O.B: 04-24-1981 Age (years): 36 Referring Provider: Lillia Corporal Height (inches): 66 Interpreting Physician: Baird Lyons MD, ABSM Weight (lbs): 263 RPSGT: Haroldine Laws BMI: 42 MRN: [id number] Neck Size: 15.00 <br> <br> CLINICAL INFORMATION Sleep Study Type: NPSG  Indication for sleep study: Fatigue, Snoring, Witnessed Apneas  Epworth Sleepiness Score: 12  SLEEP STUDY TECHNIQUE As per the AASM Manual for the Scoring of Sleep and Associated Events v2.3 (April 2016) with a hypopnea requiring 4% desaturations.  The channels recorded and monitored were frontal, central and occipital EEG, electrooculogram (EOG), submentalis EMG (chin), nasal and oral airflow, thoracic and abdominal wall motion, anterior tibialis EMG, snore microphone, electrocardiogram, and pulse oximetry.  MEDICATIONS Medications self-administered by patient taken the night of the study : none reported  SLEEP ARCHITECTURE The study was initiated at 9:57:08 PM and ended at 4:30:09 AM.  Sleep onset time was 0.0 minutes and the sleep efficiency was 95.5%. The total sleep time was 375.5 minutes.  Stage REM latency was 222.1 minutes.  The patient spent 3.96% of the night in stage N1 sleep, 54.76% in stage N2 sleep, 40.34% in stage N3 and 0.93% in REM.  Alpha intrusion was absent.  Supine sleep was 72.98%.  RESPIRATORY PARAMETERS The overall apnea/hypopnea index (AHI) was 4.2 per hour. There were 2 total apneas, including 2 obstructive, 0 central and 0 mixed apneas. There were 24 hypopneas and 0 RERAs.  The AHI during Stage REM sleep was 0.0 per hour.  AHI while supine was 5.7 per hour.  The mean oxygen saturation was 91.27%. The minimum SpO2 during sleep was 81.00%.  loud snoring was noted during this study.  CARDIAC DATA The 2 lead EKG demonstrated sinus rhythm. The mean heart rate was 78.37 beats per minute. Other  EKG findings include: PVCs.  LEG MOVEMENT DATA The total PLMS were 20 with a resulting PLMS index of 3.20. Associated arousal with leg movement index was 1.1 .  IMPRESSIONS - No significant obstructive sleep apnea occurred during this study (AHI = 4.2/h). - No significant central sleep apnea occurred during this study (CAI = 0.0/h). - Mild oxygen desaturation was noted during this study (Min O2 = 81.00%, Mean 91.2%). - The patient snored with loud snoring volume. - EKG findings include PVCs. - Clinically significant periodic limb movements did not occur during sleep. No significant associated arousals.  DIAGNOSIS - Primary snoring  RECOMMENDATIONS - Be careful with alcohol, sedatives and other CNS depressants that may worsen sleep apnea and disrupt normal sleep architecture. - Sleep hygiene should be reviewed to assess factors that may improve sleep quality.  - Sleep off flat of back, management of nasal congestion, or trial of chin strap or oral appliance might be considered for snoring. - Weight management and regular exercise should be initiated or continued if appropriate.  [Electronically signed] 10/22/2017 10:44 AM  Baird Lyons MD, ABSM Diplomate, American Board of Sleep Medicine   NPI: [id number]                          [facility]  ELECTRONICALLY SIGNED ON:  10/28/2017, 3:39 PM [place] PH: [phone]   FX: [phone] [place]

## 2017-11-23 ENCOUNTER — Telehealth: Payer: Self-pay | Admitting: Internal Medicine

## 2017-11-23 NOTE — Telephone Encounter (Signed)
Letter faxed per pt request.

## 2017-11-23 NOTE — Telephone Encounter (Signed)
Patient states she needs a letter stating that she has to have a care partner with her on the day of her procedure 2.18.19; sent to [facility] department of social services. Fax  [phone].

## 2017-12-12 ENCOUNTER — Ambulatory Visit (AMBULATORY_SURGERY_CENTER): Payer: Medicaid Other | Admitting: Internal Medicine

## 2017-12-12 ENCOUNTER — Other Ambulatory Visit: Payer: Self-pay

## 2017-12-12 ENCOUNTER — Encounter: Payer: Self-pay | Admitting: Internal Medicine

## 2017-12-12 VITALS — BP 99/41 | HR 59 | Temp 97.3°F | Resp 10 | Ht 66.0 in | Wt 257.0 lb

## 2017-12-12 DIAGNOSIS — D125 Benign neoplasm of sigmoid colon: Secondary | ICD-10-CM | POA: Diagnosis not present

## 2017-12-12 DIAGNOSIS — K529 Noninfective gastroenteritis and colitis, unspecified: Secondary | ICD-10-CM | POA: Diagnosis not present

## 2017-12-12 MED ORDER — SODIUM CHLORIDE 0.9 % IV SOLN: 500.0000 mL | Freq: Once | INTRAVENOUS | Status: DC

## 2017-12-12 NOTE — Progress Notes (Signed)
Pt. Reports no change in her medical or surgical history since her pre-visit 09/30/2017.  On new med for depression, no longer having suicidal thoughts.

## 2017-12-12 NOTE — Op Note (Signed)
[place] Patient Name: Suzanne Klein Procedure Date: 12/12/2017 11:33 AM MRN: [id number] Endoscopist: Docia Chuck. Henrene Pastor , MD Age: 37 Referring MD:  Date of Birth: 10-Jun-1981 Gender: Female Account #: [id number] Procedure:                Colonoscopy, with cold snare polypectomy x 1; with                            biopsies Indications:              Chronic diarrhea. The patient reports that since                            her office visit 2 months ago her bowel habits are                            now alternating between constipation and diarrhea Medicines:                Monitored Anesthesia Care Procedure:                Pre-Anesthesia Assessment:                           - Prior to the procedure, a History and Physical                            was performed, and patient medications and                            allergies were reviewed. The patient's tolerance of                            previous anesthesia was also reviewed. The risks                            and benefits of the procedure and the sedation                            options and risks were discussed with the patient.                            All questions were answered, and informed consent                            was obtained. Prior Anticoagulants: The patient has                            taken no previous anticoagulant or antiplatelet                            agents. ASA Grade Assessment: II - A patient with                            mild systemic disease. After reviewing the risks  and benefits, the patient was deemed in                            satisfactory condition to undergo the procedure.                           After obtaining informed consent, the colonoscope                            was passed under direct vision. Throughout the                            procedure, the patient's blood pressure, pulse, and                            oxygen  saturations were monitored continuously. The                            Colonoscope was introduced through the anus and                            advanced to the the cecum, identified by                            appendiceal orifice and ileocecal valve. The                            terminal ileum, ileocecal valve, appendiceal                            orifice, and rectum were photographed. The quality                            of the bowel preparation was excellent. The                            colonoscopy was performed without difficulty. The                            patient tolerated the procedure well. The bowel                            preparation used was SUPREP. Scope In: 11:40:16 AM Scope Out: 11:50:31 AM Scope Withdrawal Time: 0 hours 8 minutes 43 seconds  Total Procedure Duration: 0 hours 10 minutes 15 seconds  Findings:                 The terminal ileum appeared normal.                           A 2 mm polyp was found in the sigmoid colon. The                            polyp was removed with a cold snare. Resection and  retrieval were complete.                           The entire examined colon appeared otherwise normal                            on direct and retroflexion views. Biopsies for                            histology were taken with a cold forceps from the                            entire colon for evaluation of microscopic colitis. Complications:            No immediate complications. Estimated blood loss:                            None. Estimated Blood Loss:     Estimated blood loss: none. Impression:               - The examined portion of the ileum was normal.                           - One 2 mm polyp in the sigmoid colon, removed with                            a cold snare. Resected and retrieved.                           - The entire examined colon is normal on direct and                            retroflexion  views. Recommendation:           - Repeat colonoscopy in 5 years for surveillance if                            the polyp is adenomatous. Otherwise, repeat                            colonoscopy at age 64.                           - Patient has a contact number available for                            emergencies. The signs and symptoms of potential                            delayed complications were discussed with the                            patient. Return to normal activities tomorrow.  Written discharge instructions were provided to the                            patient.                           - Resume previous diet.                           - Continue present medications.                           - Await pathology results. We will send you a                            letter with the results and any further                            recommendations if necessary.                           - Initiate Metamucil 2 tablespoons daily in 12                            ounces of water or juice. This will help regulate                            your irregular bowel habits                           - Return to the care of your primary care provider.                            GI follow-up with [facility] as needed Docia Chuck. Henrene Pastor, MD 12/12/2017 11:59:05 AM This report has been signed electronically.

## 2017-12-12 NOTE — Progress Notes (Signed)
Report to PACU, RN, vss, BBS= Clear.  

## 2017-12-12 NOTE — Progress Notes (Signed)
Called to room to assist during endoscopic procedure.  Patient ID and intended procedure confirmed with present staff. Received instructions for my participation in the procedure from the performing physician.  

## 2017-12-12 NOTE — Patient Instructions (Signed)
INITIATE METAMUCIL 2 TABLESPOONS IN 12 OUNCES OF WATER OR JUICE . THIS WILL HELP WITH IRREGULAR BOWEL MOVEMENTS.  HANDOUT GIVEN : POLYPS  YOU HAD AN ENDOSCOPIC PROCEDURE TODAY AT [facility]:   Refer to the procedure report that was given to you for any specific questions about what was found during the examination.  If the procedure report does not answer your questions, please call your gastroenterologist to clarify.  If you requested that your care partner not be given the details of your procedure findings, then the procedure report has been included in a sealed envelope for you to review at your convenience later.  YOU SHOULD EXPECT: Some feelings of bloating in the abdomen. Passage of more gas than usual.  Walking can help get rid of the air that was put into your GI tract during the procedure and reduce the bloating. If you had a lower endoscopy (such as a colonoscopy or flexible sigmoidoscopy) you may notice spotting of blood in your stool or on the toilet paper. If you underwent a bowel prep for your procedure, you may not have a normal bowel movement for a few days.  Please Note:  You might notice some irritation and congestion in your nose or some drainage.  This is from the oxygen used during your procedure.  There is no need for concern and it should clear up in a day or so.  SYMPTOMS TO REPORT IMMEDIATELY:   Following lower endoscopy (colonoscopy or flexible sigmoidoscopy):  Excessive amounts of blood in the stool  Significant tenderness or worsening of abdominal pains  Swelling of the abdomen that is new, acute  Fever of 100F or higher   For urgent or emergent issues, a gastroenterologist can be reached at any hour by calling [phone].   DIET:  We do recommend a small meal at first, but then you may proceed to your regular diet.  Drink plenty of fluids but you should avoid alcoholic beverages for 24 hours.  ACTIVITY:  You should plan to take it easy for  the rest of today and you should NOT DRIVE or use heavy machinery until tomorrow (because of the sedation medicines used during the test).    FOLLOW UP: Our staff will call the number listed on your records the next business day following your procedure to check on you and address any questions or concerns that you may have regarding the information given to you following your procedure. If we do not reach you, we will leave a message.  However, if you are feeling well and you are not experiencing any problems, there is no need to return our call.  We will assume that you have returned to your regular daily activities without incident.  If any biopsies were taken you will be contacted by phone or by letter within the next 1-3 weeks.  Please call us at [phone] if you have not heard about the biopsies in 3 weeks.    SIGNATURES/CONFIDENTIALITY: You and/or your care partner have signed paperwork which will be entered into your electronic medical record.  These signatures attest to the fact that that the information above on your After Visit Summary has been reviewed and is understood.  Full responsibility of the confidentiality of this discharge information lies with you and/or your care-partner.

## 2017-12-13 ENCOUNTER — Telehealth: Payer: Self-pay | Admitting: *Deleted

## 2017-12-13 NOTE — Telephone Encounter (Signed)
  Follow up Call-  Call back number 12/12/2017  Post procedure Call Back phone  # [phone]  Permission to leave phone message Yes  Some recent data might be hidden     Patient questions:  Do you have a fever, pain , or abdominal swelling? No. Pain Score  0 *  Have you tolerated food without any problems? Yes.    Have you been able to return to your normal activities? Yes.    Do you have any questions about your discharge instructions: Diet   No. Medications  No. Follow up visit  No.  Do you have questions or concerns about your Care? No.  Actions: * If pain score is 4 or above: No action needed, pain <4.

## 2017-12-16 ENCOUNTER — Encounter: Payer: Self-pay | Admitting: Internal Medicine

## 2018-01-19 ENCOUNTER — Ambulatory Visit: Payer: Medicaid Other | Admitting: Neurology

## 2018-03-22 ENCOUNTER — Ambulatory Visit: Payer: Medicaid Other | Admitting: Neurology

## 2018-06-08 ENCOUNTER — Ambulatory Visit: Payer: Medicaid Other | Admitting: Neurology

## 2018-08-09 ENCOUNTER — Ambulatory Visit: Payer: Medicaid Other | Admitting: Neurology

## 2018-08-30 ENCOUNTER — Encounter (HOSPITAL_BASED_OUTPATIENT_CLINIC_OR_DEPARTMENT_OTHER): Payer: Self-pay | Admitting: *Deleted

## 2018-08-30 ENCOUNTER — Other Ambulatory Visit: Payer: Self-pay

## 2018-09-05 NOTE — H&P (Signed)
HISTORY AND PHYSICAL  Suzanne Klein is a 37 y.o. female patient with CC: referred by general dentist for multiple extractions.   No diagnosis found.  Past Medical History:  Diagnosis Date  . Anxiety   . Asthma   . Bipolar disorder ([place])   . Depression   . Hypothyroidism    takes synthroid but not taking currently    No current facility-administered medications for this encounter.    Current Outpatient Medications  Medication Sig Dispense Refill  . Brexpiprazole (REXULTI) 2 MG TABS Take by mouth.    . gabapentin (NEURONTIN) 800 MG tablet Take 800 mg by mouth 3 (three) times daily.    . hydrOXYzine (ATARAX/VISTARIL) 25 MG tablet Take 25 mg by mouth every 6 (six) hours as needed for anxiety.    . lamoTRIgine (LAMICTAL) 100 MG tablet Take 100 mg by mouth 2 (two) times daily.    . prazosin (MINIPRESS) 2 MG capsule Take 2 mg by mouth at bedtime.    . traZODone (DESYREL) 150 MG tablet Take by mouth at bedtime.     Allergies  Allergen Reactions  . Banana Anaphylaxis  . Latex Hives  . Tomato Anaphylaxis   Active Problems:   * No active hospital problems. *  Vitals: Height 5\' 6"  (1.676 m), weight 111.1 kg, last menstrual period 08/24/2018. Lab results:No results found for this or any previous visit (from the past 46 hour(s)). Radiology Results: No results found. General appearance: alert, cooperative and morbidly obese Head: Normocephalic, without obvious abnormality, atraumatic Eyes: negative Nose: Nares normal. Septum midline. Mucosa normal. No drainage or sinus tenderness. Throat: multiple dental caries # 2, 5, 7, 8, 9, 10, 11, 12, 17, 18, 20, 21, 23, 24, 25, 26, 28, 29, 30, Papillary lesion palatal tooth # 14.Pharynx clear.  Neck: no adenopathy, supple, symmetrical, trachea midline and thyroid not enlarged, symmetric, no tenderness/mass/nodules Resp: normal percussion bilaterally Cardio: regular rate and rhythm, S1, S2 normal, no murmur, click, rub or gallop  Assessment:  Multiple nonrestorable teeth secondary to dental caries. Lesion left palate  Plan: Multiple dental extractions with alveoloplasty, removal palatal lesion. GA, Day surgery.    Diona Browner 09/05/2018

## 2018-09-06 ENCOUNTER — Ambulatory Visit (HOSPITAL_BASED_OUTPATIENT_CLINIC_OR_DEPARTMENT_OTHER): Payer: Self-pay | Admitting: Anesthesiology

## 2018-09-06 ENCOUNTER — Ambulatory Visit (HOSPITAL_BASED_OUTPATIENT_CLINIC_OR_DEPARTMENT_OTHER)
Admission: RE | Admit: 2018-09-06 | Discharge: 2018-09-06 | Disposition: A | Discharge status: Home / Self Care | Payer: Self-pay | Source: Ambulatory Visit | Attending: Oral Surgery | Admitting: Oral Surgery

## 2018-09-06 ENCOUNTER — Encounter (HOSPITAL_BASED_OUTPATIENT_CLINIC_OR_DEPARTMENT_OTHER): Payer: Self-pay

## 2018-09-06 ENCOUNTER — Encounter (HOSPITAL_BASED_OUTPATIENT_CLINIC_OR_DEPARTMENT_OTHER)
Admission: RE | Disposition: A | Discharge status: Home / Self Care | Payer: Self-pay | Source: Ambulatory Visit | Attending: Oral Surgery

## 2018-09-06 ENCOUNTER — Other Ambulatory Visit: Payer: Self-pay

## 2018-09-06 DIAGNOSIS — Z79899 Other long term (current) drug therapy: Secondary | ICD-10-CM | POA: Insufficient documentation

## 2018-09-06 DIAGNOSIS — K029 Dental caries, unspecified: Secondary | ICD-10-CM | POA: Insufficient documentation

## 2018-09-06 DIAGNOSIS — E039 Hypothyroidism, unspecified: Secondary | ICD-10-CM | POA: Insufficient documentation

## 2018-09-06 DIAGNOSIS — Z6841 Body Mass Index (BMI) 40.0 and over, adult: Secondary | ICD-10-CM | POA: Insufficient documentation

## 2018-09-06 DIAGNOSIS — F17200 Nicotine dependence, unspecified, uncomplicated: Secondary | ICD-10-CM | POA: Insufficient documentation

## 2018-09-06 DIAGNOSIS — F319 Bipolar disorder, unspecified: Secondary | ICD-10-CM | POA: Insufficient documentation

## 2018-09-06 DIAGNOSIS — K1379 Other lesions of oral mucosa: Secondary | ICD-10-CM | POA: Insufficient documentation

## 2018-09-06 DIAGNOSIS — F419 Anxiety disorder, unspecified: Secondary | ICD-10-CM | POA: Insufficient documentation

## 2018-09-06 HISTORY — DX: Hypothyroidism, unspecified: E03.9

## 2018-09-06 HISTORY — DX: Anxiety disorder, unspecified: F41.9

## 2018-09-06 HISTORY — DX: Depression, unspecified: F32.A

## 2018-09-06 HISTORY — PX: TOOTH EXTRACTION: SHX859

## 2018-09-06 HISTORY — DX: Bipolar disorder, unspecified: F31.9

## 2018-09-06 HISTORY — DX: Unspecified asthma, uncomplicated: J45.909

## 2018-09-06 SURGERY — DENTAL RESTORATION/EXTRACTIONS
Anesthesia: General | Site: Mouth | Laterality: Bilateral

## 2018-09-06 MED ORDER — ONDANSETRON HCL 4 MG/2ML IJ SOLN
INTRAMUSCULAR | Status: DC | PRN
  Administered 2018-09-06: 4 mg via INTRAVENOUS

## 2018-09-06 MED ORDER — FENTANYL CITRATE (PF) 100 MCG/2ML IJ SOLN
INTRAMUSCULAR | Status: AC
  Filled 2018-09-06: qty 2

## 2018-09-06 MED ORDER — SCOPOLAMINE 1 MG/3DAYS TD PT72: 1.0000 | MEDICATED_PATCH | Freq: Once | TRANSDERMAL | Status: DC | PRN

## 2018-09-06 MED ORDER — DEXAMETHASONE SODIUM PHOSPHATE 4 MG/ML IJ SOLN
INTRAMUSCULAR | Status: DC | PRN
  Administered 2018-09-06: 10 mg via INTRAVENOUS

## 2018-09-06 MED ORDER — LIDOCAINE-EPINEPHRINE 2 %-1:100000 IJ SOLN
INTRAMUSCULAR | Status: DC | PRN
  Administered 2018-09-06: 20 mL

## 2018-09-06 MED ORDER — MIDAZOLAM HCL 2 MG/2ML IJ SOLN
1.0000 mg | INTRAMUSCULAR | Status: DC | PRN
  Administered 2018-09-06: 2 mg via INTRAVENOUS

## 2018-09-06 MED ORDER — SUGAMMADEX SODIUM 200 MG/2ML IV SOLN
INTRAVENOUS | Status: DC | PRN
  Administered 2018-09-06: 200 mg via INTRAVENOUS

## 2018-09-06 MED ORDER — MEPERIDINE HCL 25 MG/ML IJ SOLN: 6.2500 mg | INTRAMUSCULAR | Status: DC | PRN

## 2018-09-06 MED ORDER — SODIUM CHLORIDE 0.9 % IV SOLN
INTRAVENOUS | Status: AC | PRN
  Administered 2018-09-06: 500 mL

## 2018-09-06 MED ORDER — LIDOCAINE 2% (20 MG/ML) 5 ML SYRINGE
INTRAMUSCULAR | Status: DC | PRN
  Administered 2018-09-06: 100 mg via INTRAVENOUS

## 2018-09-06 MED ORDER — DIPHENHYDRAMINE HCL 50 MG/ML IJ SOLN
INTRAMUSCULAR | Status: AC
  Filled 2018-09-06: qty 1

## 2018-09-06 MED ORDER — OXYCODONE HCL 5 MG PO TABS: 5.0000 mg | ORAL_TABLET | Freq: Once | ORAL | Status: DC | PRN

## 2018-09-06 MED ORDER — OXYCODONE HCL 5 MG/5ML PO SOLN: 5.0000 mg | Freq: Once | ORAL | Status: DC | PRN

## 2018-09-06 MED ORDER — MIDAZOLAM HCL 2 MG/2ML IJ SOLN
INTRAMUSCULAR | Status: AC
  Filled 2018-09-06: qty 2

## 2018-09-06 MED ORDER — ROCURONIUM BROMIDE 100 MG/10ML IV SOLN
INTRAVENOUS | Status: DC | PRN
  Administered 2018-09-06: 50 mg via INTRAVENOUS

## 2018-09-06 MED ORDER — ONDANSETRON HCL 4 MG/2ML IJ SOLN
INTRAMUSCULAR | Status: AC
  Filled 2018-09-06: qty 2

## 2018-09-06 MED ORDER — OXYCODONE-ACETAMINOPHEN 5-325 MG PO TABS: 1.0000 | ORAL_TABLET | ORAL | 0 refills | Status: DC | PRN

## 2018-09-06 MED ORDER — FENTANYL CITRATE (PF) 100 MCG/2ML IJ SOLN
50.0000 ug | INTRAMUSCULAR | Status: DC | PRN
  Administered 2018-09-06 (×2): 100 ug via INTRAVENOUS

## 2018-09-06 MED ORDER — DIPHENHYDRAMINE HCL 50 MG/ML IJ SOLN
12.5000 mg | Freq: Once | INTRAMUSCULAR | Status: DC | PRN
  Administered 2018-09-06: 12.5 mg via INTRAVENOUS

## 2018-09-06 MED ORDER — PROPOFOL 10 MG/ML IV BOLUS
INTRAVENOUS | Status: DC | PRN
  Administered 2018-09-06: 200 mg via INTRAVENOUS

## 2018-09-06 MED ORDER — PROPOFOL 10 MG/ML IV BOLUS
INTRAVENOUS | Status: AC
  Filled 2018-09-06: qty 20

## 2018-09-06 MED ORDER — DEXAMETHASONE SODIUM PHOSPHATE 10 MG/ML IJ SOLN
INTRAMUSCULAR | Status: AC
  Filled 2018-09-06: qty 1

## 2018-09-06 MED ORDER — LACTATED RINGERS IV SOLN: INTRAVENOUS | Status: DC

## 2018-09-06 MED ORDER — PROMETHAZINE HCL 25 MG/ML IJ SOLN: 6.2500 mg | INTRAMUSCULAR | Status: DC | PRN

## 2018-09-06 MED ORDER — AMOXICILLIN 500 MG PO CAPS: 500.0000 mg | ORAL_CAPSULE | Freq: Three times a day (TID) | ORAL | 0 refills | Status: DC

## 2018-09-06 MED ORDER — CEFAZOLIN SODIUM-DEXTROSE 2-4 GM/100ML-% IV SOLN
INTRAVENOUS | Status: AC
  Filled 2018-09-06: qty 100

## 2018-09-06 MED ORDER — FENTANYL CITRATE (PF) 100 MCG/2ML IJ SOLN
25.0000 ug | INTRAMUSCULAR | Status: DC | PRN
  Administered 2018-09-06: 50 ug via INTRAVENOUS

## 2018-09-06 MED ORDER — CEFAZOLIN SODIUM-DEXTROSE 2-4 GM/100ML-% IV SOLN
2.0000 g | INTRAVENOUS | Status: AC
  Administered 2018-09-06: 2 g via INTRAVENOUS

## 2018-09-06 SURGICAL SUPPLY — 44 items
BANDAGE COBAN STERILE 2 (GAUZE/BANDAGES/DRESSINGS) IMPLANT
BLADE SURG 15 STRL LF DISP TIS (BLADE) ×1 IMPLANT
BLADE SURG 15 STRL SS (BLADE) ×2
BNDG EYE OVAL (GAUZE/BANDAGES/DRESSINGS) ×4 IMPLANT
BUR CROSS CUT FISSURE 1.6 (BURR) ×1 IMPLANT
BUR CROSS CUT FISSURE 1.6MM (BURR) ×1
BUR EGG ELITE 4.0 (BURR) IMPLANT
BUR EGG ELITE 4.0MM (BURR)
CANISTER SUCT 1200ML W/VALVE (MISCELLANEOUS) ×3 IMPLANT
CATH ROBINSON RED A/P 10FR (CATHETERS) IMPLANT
COVER BACK TABLE 60X90IN (DRAPES) ×3 IMPLANT
COVER MAYO STAND STRL (DRAPES) ×3 IMPLANT
COVER WAND RF STERILE (DRAPES) IMPLANT
DECANTER SPIKE VIAL GLASS SM (MISCELLANEOUS) IMPLANT
DRAPE U-SHAPE 76X120 STRL (DRAPES) ×3 IMPLANT
GAUZE PACKING FOLDED 2  STR (GAUZE/BANDAGES/DRESSINGS) ×2
GAUZE PACKING FOLDED 2 STR (GAUZE/BANDAGES/DRESSINGS) ×1 IMPLANT
GAUZE PACKING IODOFORM 1/4X15 (GAUZE/BANDAGES/DRESSINGS) IMPLANT
GLOVE BIO SURGEON STRL SZ 6.5 (GLOVE) ×2 IMPLANT
GLOVE BIO SURGEON STRL SZ7.5 (GLOVE) ×3 IMPLANT
GLOVE BIO SURGEONS STRL SZ 6.5 (GLOVE) ×1
GLOVE BIOGEL PI IND STRL 6.5 (GLOVE) ×1 IMPLANT
GLOVE BIOGEL PI INDICATOR 6.5 (GLOVE) ×2
GOWN STRL REUS W/ TWL LRG LVL3 (GOWN DISPOSABLE) ×1 IMPLANT
GOWN STRL REUS W/ TWL XL LVL3 (GOWN DISPOSABLE) ×2 IMPLANT
GOWN STRL REUS W/TWL LRG LVL3 (GOWN DISPOSABLE) ×2
GOWN STRL REUS W/TWL XL LVL3 (GOWN DISPOSABLE) ×4
IV NS 500ML (IV SOLUTION) ×2
IV NS 500ML BAXH (IV SOLUTION) ×1 IMPLANT
NEEDLE HYPO 22GX1.5 SAFETY (NEEDLE) ×3 IMPLANT
NS IRRIG 1000ML POUR BTL (IV SOLUTION) ×3 IMPLANT
PACK BASIN DAY SURGERY FS (CUSTOM PROCEDURE TRAY) ×3 IMPLANT
SLEEVE SCD COMPRESS KNEE MED (MISCELLANEOUS) ×2 IMPLANT
SPONGE SURGIFOAM ABS GEL 12-7 (HEMOSTASIS) IMPLANT
SUT CHROMIC 3 0 PS 2 (SUTURE) ×3 IMPLANT
SYR BULB 3OZ (MISCELLANEOUS) ×3 IMPLANT
SYR CONTROL 10ML LL (SYRINGE) ×3 IMPLANT
TOOTHBRUSH ADULT (PERSONAL CARE ITEMS) IMPLANT
TOWEL GREEN STERILE FF (TOWEL DISPOSABLE) ×3 IMPLANT
TRAY DSU PREP LF (CUSTOM PROCEDURE TRAY) IMPLANT
TUBE CONNECTING 20'X1/4 (TUBING) ×1
TUBE CONNECTING 20X1/4 (TUBING) ×2 IMPLANT
TUBING IRRIGATION (MISCELLANEOUS) ×3 IMPLANT
YANKAUER SUCT BULB TIP NO VENT (SUCTIONS) ×3 IMPLANT

## 2018-09-06 NOTE — Discharge Instructions (Signed)

## 2018-09-06 NOTE — H&P (Signed)
H&P documentation  -History and Physical Reviewed  -Patient has been re-examined  -No change in the plan of care  Suzanne Klein  

## 2018-09-06 NOTE — Op Note (Signed)
NAMEROYALE, Suzanne Klein MEDICAL RECORD [id number] ACCOUNT [id number] DATE OF BIRTH:10-Sep-1981 FACILITY: [facility] LOCATION: [facility] PHYSICIAN:Artisha Capri M. Carolena Fairbank, DDS  OPERATIVE REPORT  DATE OF PROCEDURE:  09/06/2018  PREOPERATIVE DIAGNOSES:  1.  Nonrestorable teeth secondary to dental caries numbers 2, 5, 7, 8, 9, 10, 11, 12, 17, 18, 20, 21, 23, 24, 25, 26, 28, 29, 30. 2.  Lesion in the left palate.  POSTOPERATIVE DIAGNOSES:   1.  Nonrestorable teeth secondary to dental caries numbers 2, 5, 7, 8, 9, 10, 11, 12, 17, 18, 20, 21, 23, 24, 25, 26, 28, 29, 30. 2.  Lesion in the left palate.  PROCEDURES:   1.  Extraction teeth 2, 5, 7, 8, 9, 10, 11, 12, 17, 18, 20, 21, 23, 24, 25, 26, 28, 29, 30. 2.  Alveoplasty right and left mandible.   3.  Removal of lesion, left palate.  SURGEON:  Diona Browner, DDS  ANESTHESIA:  General nasal intubation, Dr. Lissa Hoard attending  DESCRIPTION OF PROCEDURE:  The patient was taken to the operating room and placed on the table in supine position.  General anesthesia was administered and a nasal endotracheal tube was placed.  The eyes were protected and the patient was draped for  surgery.  A timeout was performed.  The posterior pharynx was suctioned and a throat pack was placed, 2% lidocaine 1:100,000 epinephrine was infiltrated in an inferior alveolar block on the right and left sides and then buccal and palatal infiltration in  the maxilla around the teeth to be removed.  A total of 20 mL of solution was utilized.  A bite block and sweetheart retractor were placed in the mouth and the left side was operated first.  A 15 blade was used to make an incision in the buccal and  lingual sulcus of teeth 17, 18, 20, 21, 23, 24, 25 and 26 and another incision was made in the maxillary around the maxillary teeth 7, 8, 9, 10, 11, 12. A papillary lesion which was present on the palatal surface of the gingiva between teeth # 13 and 14 was removed with the 15 blade and  submitted in formalin for pathology.  The periosteum was then reflected from the teeth.  The teeth were elevated with a 301 elevator.   The  teeth were then removed with the dental forceps.  The sockets were then curetted.  The periosteum was reflected in the left mandible to expose the alveolar bone, which was irregular in contour and then alveoplasty was performed using an egg-shaped bur.   Then, the sockets were curetted, irrigated, and closed with 3-0 chromic.  Then, the bite block and sweetheart retractor were repositioned to the other side of the mouth.  The 15 blade was then used to make an incision around teeth #28, 29, 30.  Both  buccally and lingually in the gingival sulcus and around teeth numbers 2 and 5.  The periosteum was reflected from around these teeth.  The teeth were elevated with a 301 elevator and removed from the mouth with the dental forceps.  Sockets were  curetted.  Then, the periosteum was reflected in the right mandible to expose the alveolar crest and alveoplasty was performed using an egg-shaped bur followed by a bone file.  Then, the area was irrigated and closed with 3-0 chromic.  Then, the oral  cavity was irrigated and suctioned and a throat pack was removed.  The patient was left in care of anesthesia for extubation and transport to recovery room with plans  for discharge home through day surgery.  ESTIMATED BLOOD LOSS:  Minimal.  COMPLICATIONS:  None.  SPECIMENS:  A specimen was a papilloma, left palate.  AN/NUANCE  D:09/06/2018 T:09/06/2018 JOB:[id number]

## 2018-09-06 NOTE — Transfer of Care (Signed)
Immediate Anesthesia Transfer of Care Note  Patient: Suzanne Klein  Procedure(s) Performed: DENTAL RESTORATION/EXTRACTIONS (Bilateral Mouth)  Patient Location: PACU  Anesthesia Type:General  Level of Consciousness: awake, alert  and oriented  Airway & Oxygen Therapy: Patient Spontanous Breathing and Patient connected to face mask oxygen  Post-op Assessment: Report given to RN and Post -op Vital signs reviewed and stable  Post vital signs: Reviewed and stable  Last Vitals:  Vitals Value Taken Time  BP 108/58 09/06/2018 11:12 AM  Temp    Pulse 99 09/06/2018 11:15 AM  Resp 23 09/06/2018 11:15 AM  SpO2 100 % 09/06/2018 11:15 AM  Vitals shown include unvalidated device data.  Last Pain:  Vitals:   09/06/18 0918  TempSrc: Oral  PainSc: 0-No pain      Patients Stated Pain Goal: 3 (62/70/35 0093)  Complications: No apparent anesthesia complications

## 2018-09-06 NOTE — Anesthesia Preprocedure Evaluation (Signed)
Anesthesia Evaluation  Patient identified by MRN, date of birth, ID band Patient awake    Reviewed: Allergy & Precautions, NPO status , Patient's Chart, lab work & pertinent test results  Airway Mallampati: II  TM Distance: >3 FB Neck ROM: Full    Dental  (+) Dental Advisory Given   Pulmonary neg pulmonary ROS, Current Smoker,    Pulmonary exam normal breath sounds clear to auscultation       Cardiovascular negative cardio ROS Normal cardiovascular exam Rhythm:Regular Rate:Normal     Neuro/Psych negative neurological ROS  negative psych ROS   GI/Hepatic negative GI ROS, Neg liver ROS,   Endo/Other  Hypothyroidism Morbid obesity  Renal/GU negative Renal ROS     Musculoskeletal negative musculoskeletal ROS (+)   Abdominal (+) + obese,   Peds  Hematology negative hematology ROS (+)   Anesthesia Other Findings   Reproductive/Obstetrics negative OB ROS                            Anesthesia Physical Anesthesia Plan  ASA: III  Anesthesia Plan: General   Post-op Pain Management:    Induction: Intravenous  PONV Risk Score and Plan: 2 and Ondansetron, Dexamethasone, Midazolam and Scopolamine patch - Pre-op  Airway Management Planned: Nasal ETT  Additional Equipment: None  Intra-op Plan:   Post-operative Plan: Extubation in OR  Informed Consent: I have reviewed the patients History and Physical, chart, labs and discussed the procedure including the risks, benefits and alternatives for the proposed anesthesia with the patient or authorized representative who has indicated his/her understanding and acceptance.   Dental advisory given  Plan Discussed with: CRNA  Anesthesia Plan Comments:         Anesthesia Quick Evaluation

## 2018-09-06 NOTE — Op Note (Signed)
09/06/2018  11:03 AM  PATIENT:  Michell Heinrich  37 y.o. female  PRE-OPERATIVE DIAGNOSIS:  NON-RESTORABLE TEETH # 2, 5, 7, 8, 9, 10, 11, 12, 17, 18, 20, 21, 23, 24, 25, 26, 28, 29, 30, LESION LEFT PALATE  POST-OPERATIVE DIAGNOSIS:  SAME  PROCEDURE:  Procedure(s): DENTAL /EXTRACTION TEETH # 2, 5, 7, 8, 9, 10, 11, 12, 17, 18, 20, 21, 23, 24, 25, 26, 28, 29, 30, ALVEOLOPLASTY RIGHT AND LEFT MANDIBLE, REMOVAL LESION LEFT PALATE    SURGEON:  Surgeon(s): Diona Browner, DDS  ANESTHESIA:   local and general  EBL:  minimal  DRAINS: none   SPECIMEN:  No Specimen  COUNTS:  YES  PLAN OF CARE: Discharge to home after PACU  PATIENT DISPOSITION:  PACU - hemodynamically stable.   PROCEDURE DETAILS: Dictation # [id number]  Gae Bon, DMD 09/06/2018 11:03 AM

## 2018-09-06 NOTE — Anesthesia Procedure Notes (Signed)
Procedure Name: Intubation Date/Time: 09/06/2018 10:31 AM Performed by: Maryella Shivers, CRNA Pre-anesthesia Checklist: Patient identified, Emergency Drugs available, Suction available and Patient being monitored Patient Re-evaluated:Patient Re-evaluated prior to induction Oxygen Delivery Method: Circle system utilized Preoxygenation: Pre-oxygenation with 100% oxygen Induction Type: IV induction Ventilation: Mask ventilation without difficulty Laryngoscope Size: Mac and 3 Nasal Tubes: Nasal prep performed and Nasal Rae Tube size: 6.5 mm Placement Confirmation: ETT inserted through vocal cords under direct vision,  positive ETCO2 and breath sounds checked- equal and bilateral Secured at: 24 cm Tube secured with: Tape Dental Injury: Teeth and Oropharynx as per pre-operative assessment

## 2018-09-07 ENCOUNTER — Encounter (HOSPITAL_BASED_OUTPATIENT_CLINIC_OR_DEPARTMENT_OTHER): Payer: Self-pay | Admitting: Oral Surgery

## 2018-09-08 NOTE — Anesthesia Postprocedure Evaluation (Signed)
Anesthesia Post Note  Patient: Designer, multimedia  Procedure(s) Performed: DENTAL RESTORATION/EXTRACTIONS (Bilateral Mouth)     Patient location during evaluation: PACU Anesthesia Type: General Level of consciousness: sedated and patient cooperative Pain management: pain level controlled Vital Signs Assessment: post-procedure vital signs reviewed and stable Respiratory status: spontaneous breathing Cardiovascular status: stable Anesthetic complications: no    Last Vitals:  Vitals:   09/06/18 1145 09/06/18 1201  BP: 117/70 (!) 112/53  Pulse: 92 91  Resp: 18 20  Temp:  37.3 C  SpO2: 95% 95%    Last Pain:  Vitals:   09/06/18 1201  TempSrc:   PainSc: 0-No pain                 Nolon Nations

## 2018-10-04 ENCOUNTER — Ambulatory Visit: Payer: Medicaid Other | Admitting: Neurology

## 2018-12-06 ENCOUNTER — Ambulatory Visit: Payer: Medicaid Other | Admitting: Neurology

## 2019-01-10 ENCOUNTER — Other Ambulatory Visit: Payer: Self-pay | Admitting: Family

## 2019-01-10 DIAGNOSIS — N63 Unspecified lump in unspecified breast: Secondary | ICD-10-CM

## 2019-01-12 ENCOUNTER — Other Ambulatory Visit: Payer: Self-pay | Admitting: Family

## 2019-01-15 ENCOUNTER — Other Ambulatory Visit: Payer: Self-pay | Admitting: Family

## 2019-01-15 ENCOUNTER — Ambulatory Visit
Admission: RE | Admit: 2019-01-15 | Discharge: 2019-01-15 | Disposition: A | Discharge status: Home / Self Care | Payer: Medicaid Other | Source: Ambulatory Visit | Attending: Family | Admitting: Family

## 2019-01-15 ENCOUNTER — Other Ambulatory Visit: Payer: Self-pay

## 2019-01-15 DIAGNOSIS — N63 Unspecified lump in unspecified breast: Secondary | ICD-10-CM

## 2019-01-15 DIAGNOSIS — R599 Enlarged lymph nodes, unspecified: Secondary | ICD-10-CM

## 2019-01-16 ENCOUNTER — Ambulatory Visit
Admission: RE | Admit: 2019-01-16 | Discharge: 2019-01-16 | Disposition: A | Discharge status: Home / Self Care | Payer: Medicaid Other | Source: Ambulatory Visit | Attending: Family | Admitting: Family

## 2019-01-16 ENCOUNTER — Other Ambulatory Visit: Payer: Self-pay | Admitting: Family

## 2019-01-16 DIAGNOSIS — R599 Enlarged lymph nodes, unspecified: Secondary | ICD-10-CM

## 2019-01-24 ENCOUNTER — Ambulatory Visit: Payer: Self-pay | Admitting: Neurology

## 2019-03-16 ENCOUNTER — Other Ambulatory Visit: Payer: Self-pay | Admitting: Family

## 2019-03-27 ENCOUNTER — Other Ambulatory Visit: Payer: Self-pay | Admitting: Family

## 2019-04-03 ENCOUNTER — Other Ambulatory Visit: Payer: Self-pay | Admitting: Obstetrics & Gynecology

## 2019-04-12 ENCOUNTER — Other Ambulatory Visit: Payer: Self-pay | Admitting: Specialist

## 2019-04-12 ENCOUNTER — Other Ambulatory Visit: Payer: Self-pay | Admitting: Nurse Practitioner

## 2019-04-16 ENCOUNTER — Other Ambulatory Visit: Payer: Self-pay | Admitting: Nurse Practitioner

## 2019-04-16 DIAGNOSIS — R599 Enlarged lymph nodes, unspecified: Secondary | ICD-10-CM

## 2019-04-17 ENCOUNTER — Other Ambulatory Visit: Payer: Self-pay | Admitting: Specialist

## 2019-04-17 ENCOUNTER — Other Ambulatory Visit: Payer: Self-pay | Admitting: Nurse Practitioner

## 2019-04-17 ENCOUNTER — Other Ambulatory Visit: Payer: Self-pay | Admitting: Family

## 2019-04-18 ENCOUNTER — Other Ambulatory Visit: Payer: Medicaid Other

## 2019-05-08 ENCOUNTER — Other Ambulatory Visit: Payer: Medicaid Other

## 2019-05-09 ENCOUNTER — Inpatient Hospital Stay (HOSPITAL_COMMUNITY)
Admission: AD | Admit: 2019-05-09 | Discharge: 2019-05-14 | DRG: 885 | Disposition: A | Discharge status: Home / Self Care | Payer: Medicaid Other | Attending: Psychiatry | Admitting: Psychiatry

## 2019-05-09 ENCOUNTER — Other Ambulatory Visit: Payer: Self-pay

## 2019-05-09 ENCOUNTER — Other Ambulatory Visit: Payer: Self-pay | Admitting: Behavioral Health

## 2019-05-09 ENCOUNTER — Encounter (HOSPITAL_COMMUNITY): Payer: Self-pay

## 2019-05-09 DIAGNOSIS — R45851 Suicidal ideations: Secondary | ICD-10-CM | POA: Diagnosis present

## 2019-05-09 DIAGNOSIS — F329 Major depressive disorder, single episode, unspecified: Secondary | ICD-10-CM | POA: Diagnosis present

## 2019-05-09 DIAGNOSIS — F3130 Bipolar disorder, current episode depressed, mild or moderate severity, unspecified: Secondary | ICD-10-CM | POA: Diagnosis present

## 2019-05-09 DIAGNOSIS — F332 Major depressive disorder, recurrent severe without psychotic features: Secondary | ICD-10-CM | POA: Diagnosis not present

## 2019-05-09 DIAGNOSIS — F17210 Nicotine dependence, cigarettes, uncomplicated: Secondary | ICD-10-CM | POA: Diagnosis present

## 2019-05-09 DIAGNOSIS — Z1159 Encounter for screening for other viral diseases: Secondary | ICD-10-CM | POA: Diagnosis not present

## 2019-05-09 DIAGNOSIS — F339 Major depressive disorder, recurrent, unspecified: Secondary | ICD-10-CM | POA: Diagnosis not present

## 2019-05-09 LAB — SARS CORONAVIRUS 2 BY RT PCR (HOSPITAL ORDER, PERFORMED IN ~~LOC~~ HOSPITAL LAB): SARS Coronavirus 2: NEGATIVE

## 2019-05-09 MED ORDER — HYDROXYZINE HCL 25 MG PO TABS
25.0000 mg | ORAL_TABLET | Freq: Four times a day (QID) | ORAL | Status: DC
  Administered 2019-05-10 – 2019-05-14 (×16): 25 mg via ORAL
  Filled 2019-05-09 (×23): qty 1

## 2019-05-09 MED ORDER — ACETAMINOPHEN 325 MG PO TABS
650.0000 mg | ORAL_TABLET | Freq: Four times a day (QID) | ORAL | Status: DC | PRN
  Administered 2019-05-11 – 2019-05-12 (×2): 650 mg via ORAL
  Filled 2019-05-09 (×2): qty 2

## 2019-05-09 MED ORDER — TRAZODONE HCL 50 MG PO TABS
75.0000 mg | ORAL_TABLET | Freq: Every day | ORAL | Status: DC
  Administered 2019-05-10: 21:00:00 75 mg via ORAL
  Filled 2019-05-09 (×3): qty 1

## 2019-05-09 MED ORDER — GABAPENTIN 400 MG PO CAPS
400.0000 mg | ORAL_CAPSULE | Freq: Two times a day (BID) | ORAL | Status: DC
  Administered 2019-05-10 – 2019-05-14 (×9): 400 mg via ORAL
  Filled 2019-05-09 (×12): qty 1

## 2019-05-09 MED ORDER — ALUM & MAG HYDROXIDE-SIMETH 200-200-20 MG/5ML PO SUSP
30.0000 mL | ORAL | Status: DC | PRN
  Administered 2019-05-12 – 2019-05-13 (×2): 30 mL via ORAL
  Filled 2019-05-09 (×2): qty 30

## 2019-05-09 NOTE — Progress Notes (Signed)
Suzanne Klein is a 38 y.o. female Voluntary admitted for suicide ideation. Pt transferred from observation unit. Pt stated she has been doing some stupid stuff to get people to kill her because she can not do it herself. Pt stated she will better off dead because her life does not matter anymore. Pt has 4 children for who she gave up for adoption. Pt complained of her medication not working any more, and has been increasing getting anxious. Pt live with the husband and will go back to the same living after discharge. Pt;s on disability due to mental illness. Pt complained of lower backed pain and has asked if she can be allowed to use her orthopedic pillow that she she brought with her. Pt denied SI during admission but made a statement saying that she will not mind if someone kill her. Pt tearful during admission, consents signed, skin/belongings search completed and pt oriented to unit. Pt stable at this time. Pt given the opportunity to express concerns and ask questions. Pt given toiletries. Will continue to monitor.
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
## 2019-05-09 NOTE — Tx Team (Signed)
Initial Treatment Plan 05/09/2019 11:03 PM Suzanne Klein ZLY:[id number]    PATIENT STRESSORS: Marital or family conflict Medication change or noncompliance   PATIENT STRENGTHS: Motivation for treatment/growth Physical Health   PATIENT IDENTIFIED PROBLEMS: depression  anxiety  'Get better"  "To know that may life matter"               DISCHARGE CRITERIA:  Ability to meet basic life and health needs Adequate post-discharge living arrangements Improved stabilization in mood, thinking, and/or behavior Medical problems require only outpatient monitoring  PRELIMINARY DISCHARGE PLAN: Attend aftercare/continuing care group Attend PHP/IOP Attend 12-step recovery group Outpatient therapy  PATIENT/FAMILY INVOLVEMENT: This treatment plan has been presented to and reviewed with the patient, Suzanne Klein, and/or family member. The patient and family have been given the opportunity to ask questions and make suggestions.  Wolfgang Phoenix, RN 05/09/2019, 11:03 PM

## 2019-05-10 DIAGNOSIS — F332 Major depressive disorder, recurrent severe without psychotic features: Secondary | ICD-10-CM

## 2019-05-10 LAB — CBC
HCT: 44.8 % (ref 36.0–46.0)
Hemoglobin: 13.9 g/dL (ref 12.0–15.0)
MCH: 29.4 pg (ref 26.0–34.0)
MCHC: 31 g/dL (ref 30.0–36.0)
MCV: 94.7 fL (ref 80.0–100.0)
Platelets: 284 10*3/uL (ref 150–400)
RBC: 4.73 MIL/uL (ref 3.87–5.11)
RDW: 14.5 % (ref 11.5–15.5)
WBC: 7 10*3/uL (ref 4.0–10.5)
nRBC: 0 % (ref 0.0–0.2)

## 2019-05-10 LAB — COMPREHENSIVE METABOLIC PANEL
ALT: 15 U/L (ref 0–44)
AST: 16 U/L (ref 15–41)
Albumin: 3.9 g/dL (ref 3.5–5.0)
Alkaline Phosphatase: 67 U/L (ref 38–126)
Anion gap: 9 (ref 5–15)
BUN: 13 mg/dL (ref 6–20)
CO2: 25 mmol/L (ref 22–32)
Calcium: 8.8 mg/dL — ABNORMAL LOW (ref 8.9–10.3)
Chloride: 106 mmol/L (ref 98–111)
Creatinine, Ser: 0.75 mg/dL (ref 0.44–1.00)
GFR calc Af Amer: >60 mL/min (ref >60)
GFR calc non Af Amer: >60 mL/min (ref >60)
Glucose, Bld: 109 mg/dL — ABNORMAL HIGH (ref 70–99)
Potassium: 4.4 mmol/L (ref 3.5–5.1)
Sodium: 140 mmol/L (ref 135–145)
Total Bilirubin: 0.3 mg/dL (ref 0.3–1.2)
Total Protein: 7.2 g/dL (ref 6.5–8.1)

## 2019-05-10 LAB — ETHANOL: Alcohol, Ethyl (B): <10 mg/dL (ref <10)

## 2019-05-10 LAB — LIPID PANEL
Cholesterol: 171 mg/dL (ref 0–200)
HDL: 30 mg/dL — ABNORMAL LOW (ref >40)
LDL Cholesterol: 106 mg/dL — ABNORMAL HIGH (ref 0–99)
Total CHOL/HDL Ratio: 5.7 RATIO
Triglycerides: 177 mg/dL — ABNORMAL HIGH (ref <150)
VLDL: 35 mg/dL (ref 0–40)

## 2019-05-10 LAB — HEMOGLOBIN A1C
Hgb A1c MFr Bld: 5.8 % — ABNORMAL HIGH (ref 4.8–5.6)
Mean Plasma Glucose: 119.76 mg/dL

## 2019-05-10 LAB — TSH: TSH: 3.373 u[IU]/mL (ref 0.350–4.500)

## 2019-05-10 MED ORDER — PRAZOSIN HCL 2 MG PO CAPS
4.0000 mg | ORAL_CAPSULE | Freq: Every day | ORAL | Status: DC
  Administered 2019-05-10 – 2019-05-13 (×4): 4 mg via ORAL
  Filled 2019-05-10 (×5): qty 2

## 2019-05-10 MED ORDER — PAROXETINE HCL 10 MG PO TABS
10.0000 mg | ORAL_TABLET | Freq: Every day | ORAL | Status: DC
  Administered 2019-05-10 – 2019-05-12 (×3): 10 mg via ORAL
  Filled 2019-05-10 (×5): qty 1

## 2019-05-10 MED ORDER — LEVOTHYROXINE SODIUM 25 MCG PO TABS
25.0000 ug | ORAL_TABLET | Freq: Every day | ORAL | Status: DC
  Administered 2019-05-10 – 2019-05-14 (×5): 25 ug via ORAL
  Filled 2019-05-10 (×6): qty 1

## 2019-05-10 NOTE — Plan of Care (Signed)
D: Patient is alert, oriented, pleasant, and cooperative. Denies SI, HI, AVH, and verbally contracts for safety. Patient reports feeling depressed. Patient denies physical symptoms/pain.    A: Medications administered per MD order. Support provided. Patient educated on safety on the unit and medications. Routine safety checks every 15 minutes. Patient stated understanding to tell nurse about any new physical symptoms. Patient understands to tell staff of any needs.     R: No adverse drug reactions noted. Patient verbally contracts for safety. Patient remains safe at this time and will continue to monitor.   Problem: Education: Goal: Knowledge of [facility] Education information/materials will improve Outcome: Progressing   Problem: Safety: Goal: Periods of time without injury will increase Outcome: Progressing   Patient is oriented to the unit, remains safe, and will continue to monitor.   [place] NOVEL CORONAVIRUS (COVID-19) DAILY CHECK-OFF SYMPTOMS - answer yes or no to each - every day NO YES  Have you had a fever in the past 24 hours?  Fever (Temp > 37.80C / 100F) X   Have you had any of these symptoms in the past 24 hours? New Cough  Sore Throat   Shortness of Breath  Difficulty Breathing  Unexplained Body Aches   X   Have you had any one of these symptoms in the past 24 hours not related to allergies?   Runny Nose  Nasal Congestion  Sneezing   X   If you have had runny nose, nasal congestion, sneezing in the past 24 hours, has it worsened?  X   EXPOSURES - check yes or no X   Have you traveled outside the state in the past 14 days?  X   Have you been in contact with someone with a confirmed diagnosis of COVID-19 or PUI in the past 14 days without wearing appropriate PPE?  X   Have you been living in the same home as a person with confirmed diagnosis of COVID-19 or a PUI (household contact)?    X   Have you been diagnosed with COVID-19?    X               What to do next: Answered NO to all: Answered YES to anything:   Proceed with unit schedule Follow the [facility] Inpatient Flowsheet.

## 2019-05-10 NOTE — Plan of Care (Signed)
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
## 2019-05-10 NOTE — [facility] Suicide Risk Assessment (Signed)
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  World Pharmacologist [facility]). Score between 0-7:  no or low risk or alcohol related problems. Score between 8-15:  moderate risk of alcohol related problems. Score between 16-19:  high risk of alcohol related problems. Score 20 or above:  warrants further diagnostic evaluation for alcohol dependence and treatment.  Musculoskeletal: Strength & Muscle Tone: flaccid Gait & Station: normal Patient leans: N/A  Psychiatric Specialty Exam: Physical Exam obesity/denies hypertension  ROS cardiovascular denies hypertension/neurological denies seizures/endocrine has taken low-dose Synthroid/GI/GU negative  Blood pressure 121/69, pulse 75, temperature 98.9 F (37.2 C), temperature source Oral, resp. rate 20, height 5\' 6"  (1.676 m), weight 117.5 kg, last menstrual period 04/25/2019, SpO2 99 %.Body mass index is 41.8 kg/m.  General Appearance: Casual  Eye Contact:  Poor  Speech:  Clear and Coherent  Volume:  Decreased  Mood:  Anxious and Depressed  Affect:  Tearful  Thought Process:  Goal Directed and Descriptions of Associations: Intact   Orientation:  Full (Time, Place, and Person)  Thought Content:  Rumination  Suicidal Thoughts:  Yes.  without intent/plan  Homicidal Thoughts:  No  Memory:  Immediate;   Fair  Judgement:  Fair  Insight:  Fair  Psychomotor Activity:  Normal  Concentration:  Concentration: Fair  Recall:  [employer] of Knowledge:  Fair  Language:  Fair  Akathisia:  Negative  Handed:  Right  AIMS (if indicated):     Assets:  Physical Health Resilience  ADL's:  Intact  Cognition:  WNL  Sleep:  Number of Hours: 6.75    CLINICAL FACTORS:   Depression:   Anhedonia    COGNITIVE FEATURES THAT CONTRIBUTE TO RISK:  Polarized thinking    SUICIDE RISK:   Moderate:  Frequent suicidal ideation with limited intensity, and duration, some specificity in terms of plans, no associated intent, good self-control, limited dysphoria/symptomatology, some risk factors present, and identifiable protective factors, including available and accessible social support.  PLAN OF CARE: meds adjusted   I certify that inpatient services furnished can reasonably be expected to improve the patient's condition.   Johnn Hai, MD 05/10/2019, 7:53 AM

## 2019-05-11 MED ORDER — VORTIOXETINE HBR 10 MG PO TABS
10.0000 mg | ORAL_TABLET | Freq: Every day | ORAL | Status: DC
  Administered 2019-05-11 – 2019-05-14 (×4): 10 mg via ORAL
  Filled 2019-05-11 (×5): qty 1

## 2019-05-11 MED ORDER — ZOLPIDEM TARTRATE 5 MG PO TABS
5.0000 mg | ORAL_TABLET | Freq: Every day | ORAL | Status: DC
  Administered 2019-05-11 – 2019-05-13 (×3): 5 mg via ORAL
  Filled 2019-05-11 (×3): qty 1

## 2019-05-11 MED ORDER — COMPLETENATE 29-1 MG PO CHEW: 1.0000 | CHEWABLE_TABLET | Freq: Every day | ORAL | Status: DC

## 2019-05-11 MED ORDER — PRENATAL MULTIVITAMIN CH
1.0000 | ORAL_TABLET | Freq: Every day | ORAL | Status: DC
  Administered 2019-05-11 – 2019-05-13 (×3): 1 via ORAL
  Filled 2019-05-11 (×4): qty 1

## 2019-05-11 NOTE — Plan of Care (Signed)
  Problem: Education: Goal: Ability to state activities that reduce stress will improve Outcome: Progressing   

## 2019-05-11 NOTE — Progress Notes (Signed)
[place] NOVEL CORONAVIRUS (COVID-19) DAILY CHECK-OFF SYMPTOMS - answer yes or no to each - every day NO YES  Have you had a fever in the past 24 hours?  . Fever (Temp > 37.80C / 100F) X   Have you had any of these symptoms in the past 24 hours? . New Cough .  Sore Throat  .  Shortness of Breath .  Difficulty Breathing .  Unexplained Body Aches   X   Have you had any one of these symptoms in the past 24 hours not related to allergies?   . Runny Nose .  Nasal Congestion .  Sneezing   X   If you have had runny nose, nasal congestion, sneezing in the past 24 hours, has it worsened?  X   EXPOSURES - check yes or no X   Have you traveled outside the state in the past 14 days?  X   Have you been in contact with someone with a confirmed diagnosis of COVID-19 or PUI in the past 14 days without wearing appropriate PPE?  X   Have you been living in the same home as a person with confirmed diagnosis of COVID-19 or a PUI (household contact)?    X   Have you been diagnosed with COVID-19?    X              What to do next: Answered NO to all: Answered YES to anything:   Proceed with unit schedule Follow the [facility] Inpatient Flowsheet.   
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
## 2019-05-11 NOTE — Tx Team (Signed)
Interdisciplinary Treatment and Diagnostic Plan Update  05/11/2019 Time of Session:  Suzanne Klein MRN: [id number]  Principal Diagnosis: <principal problem not specified>  Secondary Diagnoses: Active Problems:   MDD (major depressive disorder)   Current Medications:  Current Facility-Administered Medications  Medication Dose Route Frequency Provider Last Rate Last Dose  . acetaminophen (TYLENOL) tablet 650 mg  650 mg Oral Q6H PRN Mordecai Maes, NP      . alum & mag hydroxide-simeth (MAALOX/MYLANTA) 200-200-20 MG/5ML suspension 30 mL  30 mL Oral Q4H PRN Mordecai Maes, NP      . gabapentin (NEURONTIN) capsule 400 mg  400 mg Oral BID Lindon Romp A, NP   400 mg at 05/11/19 0940  . hydrOXYzine (ATARAX/VISTARIL) tablet 25 mg  25 mg Oral Q6H Lindon Romp A, NP   25 mg at 05/11/19 0941  . levothyroxine (SYNTHROID) tablet 25 mcg  25 mcg Oral Q0600 Johnn Hai, MD   25 mcg at 05/11/19 0640  . PARoxetine (PAXIL) tablet 10 mg  10 mg Oral Daily Johnn Hai, MD   10 mg at 05/11/19 0941  . prazosin (MINIPRESS) capsule 4 mg  4 mg Oral QHS Johnn Hai, MD   4 mg at 05/10/19 2111  . prenatal multivitamin tablet 1 tablet  1 tablet Oral Q1200 Hampton Abbot, MD      . vortioxetine HBr (TRINTELLIX) tablet 10 mg  10 mg Oral Daily Johnn Hai, MD   10 mg at 05/11/19 0956  . zolpidem (AMBIEN) tablet 5 mg  5 mg Oral QHS Johnn Hai, MD       PTA Medications: Facility-Administered Medications Prior to Admission  Medication Dose Route Frequency Provider Last Rate Last Dose  . 0.9 %  sodium chloride infusion  500 mL Intravenous Once Irene Shipper, MD       Medications Prior to Admission  Medication Sig Dispense Refill Last Dose  . citalopram (CELEXA) 40 MG tablet Take 40 mg by mouth daily.     Marland Kitchen gabapentin (NEURONTIN) 800 MG tablet Take 800 mg by mouth 3 (three) times daily.     . hydrOXYzine (ATARAX/VISTARIL) 50 MG tablet Take 50 mg by mouth 3 (three) times daily.     [place] Kitchen levothyroxine  (SYNTHROID) 25 MCG tablet Take 25 mcg by mouth every morning.     . prazosin (MINIPRESS) 2 MG capsule Take 2 capsules (4 mg total) by mouth at bedtime. 30 capsule 0   . REXULTI 2 MG TABS Take 1 tablet by mouth daily.     . traZODone (DESYREL) 150 MG tablet Take 0.5 tablets (75 mg total) by mouth at bedtime. 30 tablet 0   . albuterol (VENTOLIN HFA) 108 (90 Base) MCG/ACT inhaler Inhale 1-2 puffs into the lungs every 4 (four) hours as needed for shortness of breath.     . lamoTRIgine (LAMICTAL) 100 MG tablet Take 100 mg by mouth daily.  0 Not Taking at Unknown time  . pantoprazole (PROTONIX) 40 MG tablet Take 1 tablet (40 mg total) by mouth daily. (Patient not taking: Reported on 05/10/2019) 30 tablet 0 Not Taking at Unknown time  . PARoxetine (PAXIL) 20 MG tablet Take 1 tablet (20 mg total) by mouth daily. (Patient not taking: Reported on 05/10/2019) 5 tablet 0 Not Taking at Unknown time    Patient Stressors: Marital or family conflict Medication change or noncompliance  Patient Strengths: Motivation for treatment/growth Physical Health  Treatment Modalities: Medication Management, Group therapy, Case management,  1 to 1 session with clinician, Psychoeducation,  Recreational therapy.   Physician Treatment Plan for Primary Diagnosis: <principal problem not specified> Long Term Goal(s): Improvement in symptoms so as ready for discharge Improvement in symptoms so as ready for discharge   Short Term Goals: Ability to verbalize feelings will improve Ability to disclose and discuss suicidal ideas Ability to demonstrate self-control will improve Ability to identify and develop effective coping behaviors will improve Ability to identify and develop effective coping behaviors will improve Ability to maintain clinical measurements within normal limits will improve Compliance with prescribed medications will improve  Medication Management: Evaluate patient's response, side effects, and tolerance of  medication regimen.  Therapeutic Interventions: 1 to 1 sessions, Unit Group sessions and Medication administration.  Evaluation of Outcomes: Not Met  Physician Treatment Plan for Secondary Diagnosis: Active Problems:   MDD (major depressive disorder)  Long Term Goal(s): Improvement in symptoms so as ready for discharge Improvement in symptoms so as ready for discharge   Short Term Goals: Ability to verbalize feelings will improve Ability to disclose and discuss suicidal ideas Ability to demonstrate self-control will improve Ability to identify and develop effective coping behaviors will improve Ability to identify and develop effective coping behaviors will improve Ability to maintain clinical measurements within normal limits will improve Compliance with prescribed medications will improve     Medication Management: Evaluate patient's response, side effects, and tolerance of medication regimen.  Therapeutic Interventions: 1 to 1 sessions, Unit Group sessions and Medication administration.  Evaluation of Outcomes: Not Met   RN Treatment Plan for Primary Diagnosis: <principal problem not specified> Long Term Goal(s): Knowledge of disease and therapeutic regimen to maintain health will improve  Short Term Goals: Ability to demonstrate self-control, Ability to participate in decision making will improve, Ability to verbalize feelings will improve, Ability to disclose and discuss suicidal ideas, Ability to identify and develop effective coping behaviors will improve and Compliance with prescribed medications will improve  Medication Management: RN will administer medications as ordered by provider, will assess and evaluate patient's response and provide education to patient for prescribed medication. RN will report any adverse and/or side effects to prescribing provider.  Therapeutic Interventions: 1 on 1 counseling sessions, Psychoeducation, Medication administration, Evaluate  responses to treatment, Monitor vital signs and CBGs as ordered, Perform/monitor CIWA, COWS, AIMS and Fall Risk screenings as ordered, Perform wound care treatments as ordered.  Evaluation of Outcomes: Not Met   LCSW Treatment Plan for Primary Diagnosis: <principal problem not specified> Long Term Goal(s): Safe transition to appropriate next level of care at discharge, Engage patient in therapeutic group addressing interpersonal concerns.  Short Term Goals: Engage patient in aftercare planning with referrals and resources  Therapeutic Interventions: Assess for all discharge needs, 1 to 1 time with Social worker, Explore available resources and support systems, Assess for adequacy in community support network, Educate family and significant other(s) on suicide prevention, Complete Psychosocial Assessment, Interpersonal group therapy.  Evaluation of Outcomes: Not Met   Progress in Treatment: Attending groups: No. New to unit  Participating in groups: No. Taking medication as prescribed: Yes. Toleration medication: Yes. Family/Significant other contact made: No, will contact:  the patient's spouse Patient understands diagnosis: Yes. Discussing patient identified problems/goals with staff: Yes. Medical problems stabilized or resolved: Yes. Denies suicidal/homicidal ideation: Yes. Issues/concerns per patient self-inventory: No. Other:   New problem(s) identified:  None  New Short Term/Long Term Goal(s):Detox, medication stabilization, elimination of SI thoughts, development of comprehensive mental wellness plan.    Patient Goals:    Discharge Plan  or Barriers: Patient recently admitted. CSW will continue to follow and assess for appropriate referrals and possible discharge planning.    Reason for Continuation of Hospitalization: Anxiety Depression Medication stabilization Suicidal ideation  Estimated Length of Stay: 3-5 days   Attendees: Patient: 05/11/2019 10:45 AM   Physician: Dr. Johnn Hai, MD 05/11/2019 10:45 AM  Nursing: Chong Sicilian.D, RN 05/11/2019 10:45 AM  RN Care Manager: 05/11/2019 10:45 AM  Social Worker: Radonna Ricker, [place] 05/11/2019 10:45 AM  Recreational Therapist:  05/11/2019 10:45 AM  Other:  05/11/2019 10:45 AM  Other:  05/11/2019 10:45 AM  Other: 05/11/2019 10:45 AM    Scribe for Treatment Team: Marylee Floras, [place] 05/11/2019 10:45 AM

## 2019-05-11 NOTE — Progress Notes (Signed)
D: Pt was in bed in her room upon initial approach.  Pt presents with depressed affect and mood.  She reports she feels "a little better than I did yesterday."  Goal is "just to sleep and not have any nightmares."  Pt denies SI/HI, denies hallucinations, reports bilateral leg pain of 6/10.  Pt has been visible in milieu briefly.  She has stayed in her room for the majority of the evening.  A: Introduced self to pt.  Met with pt 1:1.  Actively listened to pt and offered support and encouragement.  Medications administered per order.  PRN medication administered for pain.  15 minute safety checks in place.  R: Pt is safe on the unit.  Pt is compliant with medications.  Pt verbally contracts for safety.  Will continue to monitor and assess.

## 2019-05-11 NOTE — Progress Notes (Signed)
D Pt is observed standing at the 300 hall med window this am at med pass and she is agitated, sullen and upset. She says" just give me my medicine.Marland Kitchenibuprofen wanna get [place] here". She is depressed and endorses a flat, depressed affect. She avoids making eye contact with this Probation officer. She shakes her head "no" in response to this writer asking her if she wants to  [employer]. She says " Im so tired of feeling like this".      A She completed her am self  inventory and on this she wrote she deneid having SI today and she rated her depression, hopelessness and anxiety " 9/9/9". After taking her meds, she abruptly stormed off down the 300 hall, when she found out she was not going to be discharged today. SHe was found by this writer lying on her bed, in a fetal position, crying . She spoke with Probation officer at this time, talking about how depressed and  Emotionally  Stuck she has felt for awhile now, that " every day just seems to get worse and worse". RN sat with pt while she cried, offering support and encpouragement and validation.     R Safety is in place.

## 2019-05-11 NOTE — Progress Notes (Signed)
Recreation Therapy Notes  Date:  7.17.20 Time: 0930 Location: 300 Hall Dayroom  Group Topic: Stress Management  Goal Area(s) Addresses:  Patient will identify positive stress management techniques. Patient will identify benefits of using stress management post d/c.  Intervention: Stress Management  Activity : Guided Imagery.  LRT introduced the stress management technique of guided imagery.  LRT read a script that focused on imagining your peaceful place.  Patients were to listen and follow along as script was read to participate in activity.  Education:  Stress Management, Discharge Planning.   Education Outcome: Acknowledges Education  Clinical Observations/Feedback: Pt did not attend group.     Victorino Sparrow, LRT/CTRS         Victorino Sparrow A 05/11/2019 10:44 AM

## 2019-05-12 DIAGNOSIS — F339 Major depressive disorder, recurrent, unspecified: Secondary | ICD-10-CM

## 2019-05-12 MED ORDER — PAROXETINE HCL 20 MG PO TABS
20.0000 mg | ORAL_TABLET | Freq: Every day | ORAL | Status: DC
  Administered 2019-05-13 – 2019-05-14 (×2): 20 mg via ORAL
  Filled 2019-05-12 (×3): qty 1

## 2019-05-12 MED ORDER — NICOTINE POLACRILEX 2 MG MT GUM: 2.0000 mg | CHEWING_GUM | OROMUCOSAL | Status: DC | PRN

## 2019-05-12 NOTE — Progress Notes (Signed)
Wrap up group note   Pt stated that her day was a 3 and the her goal was to go home and that her goal was not met.  Pt also stated that sleeping is one of her coping skills and one of her concern when will the meds start working.

## 2019-05-12 NOTE — Progress Notes (Signed)
D  D Ptt is observed OOB UAL on the 300 hall today-she tolerates this well. She wears her own clothes she is rumpled and says " I just goit OOB". She takes her scheduled meds as ordered and she says " I dont know how much better Im doing today..I  think I maybe feel better today".  A SHe completed her daily assessment and on this she wrote she denied SI today and se Rarted her depression, hopelessness and anxiety  " " 7/7?7", respectively. She says " I'm going to try to stay outta my room more today". Her affect remains flat and blunted.  R Safety in place.  ................................................................................................................................................................................................................................................................................................................................................................................Marland Kitchen

## 2019-05-12 NOTE — Progress Notes (Signed)
D: Pt was in hallway upon initial approach.  Pt presents with depressed affect and mood.  She states "I'm good" and her goal is "just to go home."  Pt reports she may discharge Monday and then states "I don't feel any better than when I came in."  Pt was encouraged to discuss further with provider.  Pt denies SI/HI, denies hallucinations, denies pain.  Pt has been visible in milieu with minimal interactions with others.   A: Met with pt 1:1.  Actively listened to pt and offered support and encouragement. Medications administered per order.  15 minute safety checks in place.  R: Pt is safe on the unit.  Pt is compliant with medications.  Pt verbally contracts for safety.  Will continue to monitor and assess.

## 2019-05-12 NOTE — BHH Group Notes (Signed)
LCSW Group Therapy Note  05/12/2019   10:00-11:00am   Type of Therapy and Topic:  Group Therapy: Anger Cues and Responses  Participation Level:  Did Not Attend   Description of Group:   In this group, patients learned how to recognize the physical, cognitive, emotional, and behavioral responses they have to anger-provoking situations.  They identified a recent time they became angry and how they reacted.  They analyzed how their reaction was possibly beneficial and how it was possibly unhelpful.  The group discussed a variety of healthier coping skills that could help with such a situation in the future.  Deep breathing was practiced briefly.  Therapeutic Goals: 1. Patients will remember their last incident of anger and how they felt emotionally and physically, what their thoughts were at the time, and how they behaved. 2. Patients will identify how their behavior at that time worked for them, as well as how it worked against them. 3. Patients will explore possible new behaviors to use in future anger situations. 4. Patients will learn that anger itself is normal and cannot be eliminated, and that healthier reactions can assist with resolving conflict rather than worsening situations.  Summary of Patient Progress:  Did not attend  Therapeutic Modalities:   Cognitive Behavioral Therapy  Rolanda Jay

## 2019-05-12 NOTE — Progress Notes (Signed)
[place] NOVEL CORONAVIRUS (COVID-19) DAILY CHECK-OFF SYMPTOMS - answer yes or no to each - every day NO YES  Have you had a fever in the past 24 hours?  . Fever (Temp > 37.80C / 100F) X   Have you had any of these symptoms in the past 24 hours? . New Cough .  Sore Throat  .  Shortness of Breath .  Difficulty Breathing .  Unexplained Body Aches   X   Have you had any one of these symptoms in the past 24 hours not related to allergies?   . Runny Nose .  Nasal Congestion .  Sneezing   X   If you have had runny nose, nasal congestion, sneezing in the past 24 hours, has it worsened?  X   EXPOSURES - check yes or no X   Have you traveled outside the state in the past 14 days?  X   Have you been in contact with someone with a confirmed diagnosis of COVID-19 or PUI in the past 14 days without wearing appropriate PPE?  X   Have you been living in the same home as a person with confirmed diagnosis of COVID-19 or a PUI (household contact)?    X   Have you been diagnosed with COVID-19?    X              What to do next: Answered NO to all: Answered YES to anything:   Proceed with unit schedule Follow the [facility] Inpatient Flowsheet.   

## 2019-05-12 NOTE — B[facility] Group Notes (Signed)
[facility] Group Notes:  (Nursing/MHT/Case Management/Adjunct)  Date:  05/12/2019  Time:  1:15 PM  Type of Therapy:  Nurse Education  Participation Level:  Active  Participation Quality:  Appropriate and Attentive  Affect:  Appropriate  Cognitive:  Alert and Appropriate  Insight:  Appropriate  Engagement in Group:  Engaged and Improving  Modes of Intervention:  Discussion, Education and Exploration  Summary of Progress/Problems:  pt's were instructed to identify needs and then discuss how these needs could be met/obtained  Baron Sane 05/12/2019, 3:12 PM

## 2019-05-12 NOTE — Plan of Care (Signed)
  Problem: Education: Goal: Ability to state activities that reduce stress will improve Outcome: Progressing   

## 2019-05-12 NOTE — Progress Notes (Signed)
[facility] MD Progress Note  05/12/2019 12:19 PM Suzanne Klein  MRN:  [id number]    Subjective:  Suzanne Klein reported " Do you know when I will be discharged?."   Evaluation: Suzanne Klein observed sitting in bathroom.  She is awake, alert and oriented x3.  Presented flat, guarded an irritable.   Denying suicidal or homicidal ideations.  Reports feeling better than on admission.  Reports her mood has improved since she was started on Trintellix.  She reports history with chronic depression.  Patient reports a good appetite.  States she is resting well throughout the night.  Reports significant vivid dreams "zombie apocalypse."  Support,  encouragement and reassurance was provided.  Principal Problem: <principal problem not specified> Diagnosis: Active Problems:   MDD (major depressive disorder)  Total Time spent with patient: 20 minutes  Past Psychiatric History: Extensive and described as treatment resistant  Past Medical History:  Past Medical History:  Diagnosis Date  . Abnormal Pap smear   . Borderline personality disorder ([place])    No meds  . Heartburn in pregnancy   . HPV in female   . HPV in female   . Migraines   . Panic anxiety syndrome    no meds  . PTSD (post-traumatic stress disorder)    No meds  . Scoliosis of lumbar spine   . Termination of pregnancy    x 1    Past Surgical History:  Procedure Laterality Date  . CESAREAN SECTION     x 2  . CESAREAN SECTION  07/21/2012   Procedure: CESAREAN SECTION;  Surgeon: Frederico Hamman, MD;  Location: [facility];  Service: Obstetrics;  Laterality: N/A;  Repeat Cesarean Section Delivery Boy @ [phone], Apgars 9/9  . CESAREAN SECTION    . COLPOSCOPY    . DILATION AND CURETTAGE OF UTERUS    . WISDOM TOOTH EXTRACTION     Family History:  Family History  Problem Relation Age of Onset  . Cancer Mother   . Heart disease Mother   . Dementia Maternal Grandfather   . Anesthesia problems Neg Hx     Social History:  Social History    Substance and Sexual Activity  Alcohol Use Yes     Social History   Substance and Sexual Activity  Drug Use No    Social History   Socioeconomic History  . Marital status: Married    Spouse name: Not on file  . Number of children: Not on file  . Years of education: Not on file  . Highest education level: Not on file  Occupational History  . Not on file  Social Needs  . Financial resource strain: Not on file  . Food insecurity    Worry: Not on file    Inability: Not on file  . Transportation needs    Medical: Not on file    Non-medical: Not on file  Tobacco Use  . Smoking status: Current Every Day Smoker    Packs/day: 0.15    Years: 13.00    Pack years: 1.95    Types: Cigarettes  . Smokeless tobacco: Never Used  Substance and Sexual Activity  . Alcohol use: Yes  . Drug use: No  . Sexual activity: Yes    Birth control/protection: None    Comment: pregnant  Lifestyle  . Physical activity    Days per week: Not on file    Minutes per session: Not on file  . Stress: Not on file  Relationships  . Social connections  Talks on phone: Not on file    Gets together: Not on file    Attends religious service: Not on file    Active member of club or organization: Not on file    Attends meetings of clubs or organizations: Not on file    Relationship status: Not on file  Other Topics Concern  . Not on file  Social History Narrative   ** Merged History Encounter **       Additional Social History:    Pain Medications: See MAR Prescriptions: See MAR Over the Counter: See MAR History of alcohol / drug use?: No history of alcohol / drug abuse Withdrawal Symptoms: Other (Comment)(none)                    Sleep: Fair  Appetite:  Fair  Current Medications: Current Facility-Administered Medications  Medication Dose Route Frequency Provider Last Rate Last Dose  . acetaminophen (TYLENOL) tablet 650 mg  650 mg Oral Q6H PRN Mordecai Maes, NP   650 mg at  05/12/19 0620  . alum & mag hydroxide-simeth (MAALOX/MYLANTA) 200-200-20 MG/5ML suspension 30 mL  30 mL Oral Q4H PRN Mordecai Maes, NP   30 mL at 05/12/19 0912  . gabapentin (NEURONTIN) capsule 400 mg  400 mg Oral BID Lindon Romp A, NP   400 mg at 05/12/19 0910  . hydrOXYzine (ATARAX/VISTARIL) tablet 25 mg  25 mg Oral Q6H Lindon Romp A, NP   25 mg at 05/12/19 1207  . levothyroxine (SYNTHROID) tablet 25 mcg  25 mcg Oral Q0600 Johnn Hai, MD   25 mcg at 05/12/19 0620  . nicotine polacrilex (NICORETTE) gum 2 mg  2 mg Oral PRN Sharma Covert, MD      . PARoxetine (PAXIL) tablet 10 mg  10 mg Oral Daily Johnn Hai, MD   10 mg at 05/12/19 0910  . prazosin (MINIPRESS) capsule 4 mg  4 mg Oral QHS Johnn Hai, MD   4 mg at 05/11/19 2105  . prenatal multivitamin tablet 1 tablet  1 tablet Oral Q1200 Hampton Abbot, MD   1 tablet at 05/12/19 1207  . vortioxetine HBr (TRINTELLIX) tablet 10 mg  10 mg Oral Daily Johnn Hai, MD   10 mg at 05/12/19 0910  . zolpidem (AMBIEN) tablet 5 mg  5 mg Oral QHS Johnn Hai, MD   5 mg at 05/11/19 2105    Lab Results:  No results found for this or any previous visit (from the past 70 hour(s)).  Blood Alcohol level:  Lab Results  Component Value Date   ETH <10 05/10/2019   ETH <5 44/12/4740    Metabolic Disorder Labs: Lab Results  Component Value Date   HGBA1C 5.8 (H) 05/10/2019   MPG 119.76 05/10/2019   MPG 117 11/08/2016   Lab Results  Component Value Date   PROLACTIN 21.8 11/08/2016   Lab Results  Component Value Date   CHOL 171 05/10/2019   TRIG 177 (H) 05/10/2019   HDL 30 (L) 05/10/2019   CHOLHDL 5.7 05/10/2019   VLDL 35 05/10/2019   LDLCALC 106 (H) 05/10/2019   LDLCALC 98 11/08/2016    Physical Findings: AIMS: Facial and Oral Movements Muscles of Facial Expression: None, normal Lips and Perioral Area: None, normal Jaw: None, normal Tongue: None, normal,Extremity Movements Upper (arms, wrists, hands, fingers): None,  normal Lower (legs, knees, ankles, toes): None, normal, Trunk Movements Neck, shoulders, hips: None, normal, Overall Severity Severity of abnormal movements (highest score from questions above): None,  normal Incapacitation due to abnormal movements: None, normal Patient's awareness of abnormal movements (rate only patient's report): No Awareness, Dental Status Current problems with teeth and/or dentures?: Yes(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  Psychiatric: She has a normal mood and affect.    Review of Systems  Psychiatric/Behavioral: Positive for depression. The patient is nervous/anxious.     Blood pressure 112/87, pulse 98, temperature 99.1 F (37.3 C), resp. rate 16, height 5\' 6"  (1.676 m), weight 117.5 kg, last menstrual period 04/25/2019, SpO2 99 %.Body mass index is 41.8 kg/m.  General Appearance: Casual, flat and guarded  Eye Contact:  Fair  Speech:  Slow  Volume:  Decreased  Mood:  Anxious and Depressed  Affect:  Blunt and Congruent  Thought Process:  Goal Directed and Descriptions of Associations: Intact  Orientation:  Full (Time, Place, and Person)  Thought Content:  Logical and Rumination  Suicidal Thoughts:  No  Homicidal Thoughts:  No  Memory:  Immediate;   Fair Recent;   Fair Remote;   Fair  Judgement:  Fair  Insight:  Fair  Psychomotor Activity:  Normal  Concentration:  Concentration: Fair  Recall:  [place] of Knowledge: nl  Language:  Good  Akathisia:  Negative  Handed:  Right  AIMS (if indicated):     Assets:  Leisure Time Physical Health Resilience Social Support  ADL's:  Intact  Cognition:  WNL  Sleep:  Number of Hours: 6.5     Treatment Plan  Summary: Daily contact with patient to assess and evaluate symptoms and progress in treatment and Medication management   Continue her current treatment plan on 05/12/2019 as listed below except were noted   Major depressive disorder:  Increase Paxil 10 mg to 20 mg p.o. daily Continue Trintellix 10 mg p.o. daily Continue Ambien 5 mg p.o. nightly Continue Neurontin 400 mg p.o. twice daily  CSW to continue working on discharge disposition Patient encouraged to participate within the therapeutic milieu   Derrill [place], NP 05/12/2019, 12:19 PM

## 2019-05-13 NOTE — Progress Notes (Signed)
[place] NOVEL CORONAVIRUS (COVID-19) DAILY CHECK-OFF SYMPTOMS - answer yes or no to each - every day NO YES  Have you had a fever in the past 24 hours?  . Fever (Temp > 37.80C / 100F) X   Have you had any of these symptoms in the past 24 hours? . New Cough .  Sore Throat  .  Shortness of Breath .  Difficulty Breathing .  Unexplained Body Aches   X   Have you had any one of these symptoms in the past 24 hours not related to allergies?   . Runny Nose .  Nasal Congestion .  Sneezing   X   If you have had runny nose, nasal congestion, sneezing in the past 24 hours, has it worsened?  X   EXPOSURES - check yes or no X   Have you traveled outside the state in the past 14 days?  X   Have you been in contact with someone with a confirmed diagnosis of COVID-19 or PUI in the past 14 days without wearing appropriate PPE?  X   Have you been living in the same home as a person with confirmed diagnosis of COVID-19 or a PUI (household contact)?    X   Have you been diagnosed with COVID-19?    X              What to do next: Answered NO to all: Answered YES to anything:   Proceed with unit schedule Follow the [facility] Inpatient Flowsheet.   

## 2019-05-13 NOTE — Progress Notes (Signed)
D Patient is observed standing at the 300 hall med window, after writer summoned her to get OOB and start the day. She speaks very softly, she is tearful, agitated and depressed. She says " what do you do if yo;ve given up...ibuprofen don't have anything to fall back on and I don't know how much longer I can hang on". She avoids making eye contact. SHe is upset. SHe says" what happens if my limbic system never matures?". She wears her own clothes. HEr hair is clean but uncombed. She says " I know I stayed in the bed for the past 2 days...but Im going to make myself STAY out of my room today. ".      A She takes her scheduled medications as planned. She completed her daily assessment and on this she wrote she denied SI today and she rated her depression, hopelessness and anxiety " 5/5/ ", respectively. She is encouraged by this writer to focus on her cognitive thougths and to stay in her cognitive awareness. " She says " I know this will help me....but its SO hard...     R Safety is in place.

## 2019-05-13 NOTE — [facility] Group Notes (Signed)
[facility] LCSW Group Therapy Note  Date/Time:  05/13/2019 9:00-10:00 or 10:00-11:00AM  Type of Therapy and Topic:  Group Therapy:  Healthy and Unhealthy Supports  Participation Level:  Active   Description of Group:  Patients in this group were introduced to the idea of adding a variety of healthy supports to address the various needs in their lives.Patients discussed what additional healthy supports could be helpful in their recovery and wellness after discharge in order to prevent future hospitalizations.   An emphasis was placed on using counselor, doctor, therapy groups, 12-step groups, and problem-specific support groups to expand supports.  They also worked as a group on developing a specific plan for several patients to deal with unhealthy supports through [place], psychoeducation with loved ones, and even termination of relationships.   Therapeutic Goals:   1)  discuss importance of adding supports to stay well once out of the hospital  2)  compare healthy versus unhealthy supports and identify some examples of each  3)  generate ideas and descriptions of healthy supports that can be added  4)  offer mutual support about how to address unhealthy supports  5)  encourage active participation in and adherence to discharge plan    Summary of Patient Progress:  The patient stated that current healthy supports in her life include her therapist while she continues to struggle with forgiving herself.  The patient expressed a willingness to keep working in therapy as a support to help in her recovery journey.   Therapeutic Modalities:   Motivational Interviewing Brief Solution-Focused Therapy  Rolanda Jay

## 2019-05-13 NOTE — [facility] Suicide Risk Assessment (Signed)
[facility] INPATIENT:  Family/Significant Other Suicide Prevention Education  Suicide Prevention Education:  Contact Attempts: [employer], husband.  Only way to contact him is through Melody Comas, [phone] has been identified by the patient as the family member/significant other with whom the patient will be residing, and identified as the person(s) who will aid the patient in the event of a mental health crisis.  With written consent from the patient, two attempts were made to provide suicide prevention education, prior to and/or following the patient's discharge.  We were unsuccessful in providing suicide prevention education.  A suicide education pamphlet was given to the patient to share with family/significant other.  Date and time of first attempt:  05/13/2019  /  4:19 PM   - CSW did make contact with Arnell Sieving, pt's husband's neighbor, but he stated he was not around the husband at that time so could not help.  CSW asked that he let husband know a CSW will try again tomorrow.  Date and time of second attempt:  CSW team to follow up   Maretta Los 05/13/2019, 4:19 PM

## 2019-05-13 NOTE — Plan of Care (Signed)
  Problem: Coping: Goal: Ability to identify and develop effective coping behavior will improve Outcome: Progressing   

## 2019-05-13 NOTE — Progress Notes (Signed)
[facility] MD Progress Note  05/13/2019 9:07 AM Suzanne Klein  MRN:  [id number]    Subjective:   Suzanne Klein reported " I just can't get out of my head."   Evaluation: Suzanne Klein observed resting in bed.  She presents tearful and depressed.  As she reports feelings of guilt and shame for having her best friend lied to her husband.  She reports sadness because her husband told her that" he loves me but his is not in love with me anymore" due to her lying about small things.  Rates her depression 8 out of 10 with 10 being the worst however continues to request to be discharge to be home with her "fur babies."   Rates her anxiety 7 out of 10 with 10 being the worst patient was encouraged to participate within the therapeutic milieu.  Reports taking and tolerating medications well.  Was recently initiated on Minipress for reported nightmare/PTS D symptoms.  We will continue to monitor.  Reports a good appetite.  Support, encouragement and reassurance was provided.  Principal Problem: MDD (major depressive disorder) Diagnosis: Principal Problem:   MDD (major depressive disorder)  Total Time spent with patient: 20 minutes  Past Psychiatric History: Extensive and described as treatment resistant  Past Medical History:  Past Medical History:  Diagnosis Date  . Abnormal Pap smear   . Borderline personality disorder ([place])    No meds  . Heartburn in pregnancy   . HPV in female   . HPV in female   . Migraines   . Panic anxiety syndrome    no meds  . PTSD (post-traumatic stress disorder)    No meds  . Scoliosis of lumbar spine   . Termination of pregnancy    x 1    Past Surgical History:  Procedure Laterality Date  . CESAREAN SECTION     x 2  . CESAREAN SECTION  07/21/2012   Procedure: CESAREAN SECTION;  Surgeon: Frederico Hamman, MD;  Location: [facility];  Service: Obstetrics;  Laterality: N/A;  Repeat Cesarean Section Delivery Boy @ [phone], Apgars 9/9  . CESAREAN SECTION    . COLPOSCOPY     . DILATION AND CURETTAGE OF UTERUS    . WISDOM TOOTH EXTRACTION     Family History:  Family History  Problem Relation Age of Onset  . Cancer Mother   . Heart disease Mother   . Dementia Maternal Grandfather   . Anesthesia problems Neg Hx     Social History:  Social History   Substance and Sexual Activity  Alcohol Use Yes     Social History   Substance and Sexual Activity  Drug Use No    Social History   Socioeconomic History  . Marital status: Married    Spouse name: Not on file  . Number of children: Not on file  . Years of education: Not on file  . Highest education level: Not on file  Occupational History  . Not on file  Social Needs  . Financial resource strain: Not on file  . Food insecurity    Worry: Not on file    Inability: Not on file  . Transportation needs    Medical: Not on file    Non-medical: Not on file  Tobacco Use  . Smoking status: Current Every Day Smoker    Packs/day: 0.15    Years: 13.00    Pack years: 1.95    Types: Cigarettes  . Smokeless tobacco: Never Used  Substance and Sexual  Activity  . Alcohol use: Yes  . Drug use: No  . Sexual activity: Yes    Birth control/protection: None    Comment: pregnant  Lifestyle  . Physical activity    Days per week: Not on file    Minutes per session: Not on file  . Stress: Not on file  Relationships  . Social Herbalist on phone: Not on file    Gets together: Not on file    Attends religious service: Not on file    Active member of club or organization: Not on file    Attends meetings of clubs or organizations: Not on file    Relationship status: Not on file  Other Topics Concern  . Not on file  Social History Narrative   ** Merged History Encounter **       Additional Social History:    Pain Medications: See MAR Prescriptions: See MAR Over the Counter: See MAR History of alcohol / drug use?: No history of alcohol / drug abuse Withdrawal Symptoms: Other  (Comment)(none)                    Sleep: Fair  Appetite:  Fair  Current Medications: Current Facility-Administered Medications  Medication Dose Route Frequency Provider Last Rate Last Dose  . acetaminophen (TYLENOL) tablet 650 mg  650 mg Oral Q6H PRN Mordecai Maes, NP   650 mg at 05/12/19 0620  . alum & mag hydroxide-simeth (MAALOX/MYLANTA) 200-200-20 MG/5ML suspension 30 mL  30 mL Oral Q4H PRN Mordecai Maes, NP   30 mL at 05/12/19 0912  . gabapentin (NEURONTIN) capsule 400 mg  400 mg Oral BID Lindon Romp A, NP   400 mg at 05/13/19 0981  . hydrOXYzine (ATARAX/VISTARIL) tablet 25 mg  25 mg Oral Q6H Lindon Romp A, NP   25 mg at 05/13/19 0611  . levothyroxine (SYNTHROID) tablet 25 mcg  25 mcg Oral Q0600 Johnn Hai, MD   25 mcg at 05/13/19 [id number]  . nicotine polacrilex (NICORETTE) gum 2 mg  2 mg Oral PRN Sharma Covert, MD      . PARoxetine (PAXIL) tablet 20 mg  20 mg Oral Daily Derrill Center, NP   20 mg at 05/13/19 0832  . prazosin (MINIPRESS) capsule 4 mg  4 mg Oral QHS Johnn Hai, MD   4 mg at 05/12/19 2100  . prenatal multivitamin tablet 1 tablet  1 tablet Oral Q1200 Hampton Abbot, MD   1 tablet at 05/12/19 1207  . vortioxetine HBr (TRINTELLIX) tablet 10 mg  10 mg Oral Daily Johnn Hai, MD   10 mg at 05/13/19 7829  . zolpidem (AMBIEN) tablet 5 mg  5 mg Oral QHS Johnn Hai, MD   5 mg at 05/12/19 2100    Lab Results:  No results found for this or any previous visit (from the past 48 hour(s)).  Blood Alcohol level:  Lab Results  Component Value Date   ETH <10 05/10/2019   ETH <5 56/21/3086    Metabolic Disorder Labs: Lab Results  Component Value Date   HGBA1C 5.8 (H) 05/10/2019   MPG 119.76 05/10/2019   MPG 117 11/08/2016   Lab Results  Component Value Date   PROLACTIN 21.8 11/08/2016   Lab Results  Component Value Date   CHOL 171 05/10/2019   TRIG 177 (H) 05/10/2019   HDL 30 (L) 05/10/2019   CHOLHDL 5.7 05/10/2019   VLDL 35 05/10/2019    LDLCALC 106 (H) 05/10/2019  [place] 98 11/08/2016    Physical Findings: AIMS: Facial and Oral Movements Muscles of Facial Expression: None, normal Lips and Perioral Area: None, normal Jaw: None, normal Tongue: None, normal,Extremity Movements Upper (arms, wrists, hands, fingers): None, normal Lower (legs, knees, ankles, toes): None, normal, Trunk Movements Neck, shoulders, hips: None, normal, Overall Severity Severity of abnormal movements (highest score from questions above): None, normal Incapacitation due to abnormal movements: None, normal Patient's awareness of abnormal movements (rate only patient's report): No Awareness, Dental Status Current problems with teeth and/or dentures?: Yes(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 Constitutional: She appears well-developed.  Psychiatric: She has a normal mood and affect.    Review of Systems  Psychiatric/Behavioral: Positive for depression. The patient is nervous/anxious.   All other systems reviewed and are negative.   Blood pressure 136/64, pulse 91, temperature 98.8 F (37.1 C), temperature source Oral, resp. rate 18, height 5\' 6"  (1.676 m), weight 117.5 kg, last menstrual period 04/25/2019, SpO2 98 %.Body mass index is 41.8 kg/m.  General Appearance: Casual, flat and guarded  Eye Contact:  Fair  Speech:  Slow  Volume:  Decreased  Mood:  Anxious and Depressed  Affect:  Blunt and Congruent  Thought Process:  Goal Directed and Descriptions of Associations: Intact  Orientation:  Full (Time, Place, and Person)  Thought Content:  Logical and Rumination  Suicidal Thoughts:  No  Homicidal Thoughts:  No  Memory:  Immediate;   Fair Recent;    Fair Remote;   Fair  Judgement:  Fair  Insight:  Fair  Psychomotor Activity:  Normal  Concentration:  Concentration: Fair  Recall:  [place] of Knowledge: nl  Language:  Good  Akathisia:  Negative  Handed:  Right  AIMS (if indicated):     Assets:  Leisure Time Physical Health Resilience Social Support  ADL's:  Intact  Cognition:  WNL  Sleep:  Number of Hours: 6.75     Treatment Plan Summary: Daily contact with patient to assess and evaluate symptoms and progress in treatment and Medication management   Continue her current treatment plan on 05/13/2019 as listed below except were noted  Major depressive disorder:  Continue Paxil  20 mg p.o. daily Continue Trintellix 10 mg p.o. daily Continue Ambien 5 mg p.o. nightly Continue Neurontin 400 mg p.o. twice daily  CSW to continue working on discharge disposition Patient encouraged to participate within the therapeutic milieu  Derrill Center, NP 05/13/2019, 9:07 AM

## 2019-05-13 NOTE — [facility] Group Notes (Signed)
[facility]  Date:  05/13/2019  Time:  1300  Type of Therapy:  Nurse Education  :  The group focuses on teaching patients how to identify unhealthy behaviors and then how to develop and practice newer and healthier ways to get their needs met.  Participation Level:  Active  Participation Quality:  Appropriate  Affect:  Appropriate  Cognitive:  Appropriate  Insight:  Good  Engagement in Group:  Engaged  Modes of Intervention:  Education  Summary of Progress/Problems:  Lauralyn Primes 05/13/2019, 4:25 PM

## 2019-05-13 NOTE — Progress Notes (Signed)
Pt a sleep at this time, respiration even and unlabored , will continue to monitor.

## 2019-05-13 NOTE — [facility] Group Notes (Signed)
Life SKills  Date:  05/13/2019  Time:  1300  Type of Therapy:  Nurse Education  : The group is focused on teaching patients how to identify unhealthy behaviors and develop newer and healtheir ways to get their needs met.  Participation Level:  Active  Participation Quality:  Attentive  Affect:  Appropriate  Cognitive:  Alert  Insight:  Appropriate  Engagement in Group:  Engaged  Modes of Intervention:  Education  Summary of Progress/Problems:  Suzanne Klein 05/13/2019, 4:03 PM

## 2019-05-13 NOTE — Progress Notes (Signed)
[place] Group Notes:  (Nursing/MHT/Case Management/Adjunct)  Date:  05/13/2019  Time:  2030  Type of Therapy:  wrap up group  Participation Level:  Active  Participation Quality:  Appropriate, Attentive, Sharing and Supportive  Affect:  Depressed  Cognitive:  Appropriate  Insight:  Improving  Engagement in Group:  Engaged  Modes of Intervention:  Clarification, Education and Support  Summary of Progress/Problems:Pt reports being not as depressed and attributes the medication for the increase in mood. Pt requested and was allowed to listen to a song that was theraputic to her after group time. Pt plans on managing her disease better when she is discharged by way of medicine. Pt is grateful for her husband.   Shellia Cleverly 05/13/2019, 9:43 PM

## 2019-05-14 MED ORDER — VORTIOXETINE HBR 10 MG PO TABS: 10.0000 mg | ORAL_TABLET | Freq: Every day | ORAL | 2 refills | Status: DC

## 2019-05-14 MED ORDER — ZOLPIDEM TARTRATE 10 MG PO TABS: 10.0000 mg | ORAL_TABLET | Freq: Every evening | ORAL | 1 refills | Status: DC | PRN

## 2019-05-14 MED ORDER — PRENATAL MULTIVITAMIN CH: 1.0000 | ORAL_TABLET | Freq: Every day | ORAL | 2 refills | Status: DC

## 2019-05-14 NOTE — [facility] Suicide Risk Assessment (Signed)
[facility] Discharge Suicide Risk Assessment   Principal Problem: MDD (major depressive disorder) Discharge Diagnoses: Principal Problem:   MDD (major depressive disorder)   Total Time spent with patient: 45 minutes  Musculoskeletal: Strength & Muscle Tone: within normal limits Gait & Station: normal Patient leans: N/A  Psychiatric Specialty Exam: ROS  Blood pressure 105/74, pulse 94, temperature 98.5 F (36.9 C), temperature source Oral, resp. rate 20, height 5\' 6"  (1.676 m), weight 117.5 kg, last menstrual period 04/25/2019, SpO2 99 %.Body mass index is 41.8 kg/m.  General Appearance: Casual  Eye Contact::  Good  Speech:  Clear and Coherent409  Volume:  Normal  Mood:  Euthymic  Affect:  Full Range  Thought Process:  Coherent  Orientation:  Full (Time, Place, and Person)  Thought Content:  Tangential  Suicidal Thoughts:  No  Homicidal Thoughts:  No  Memory:  Recent;   Good  Judgement:  Good  Insight:  Good  Psychomotor Activity:  Normal  Concentration:  Good  Recall:  Good  Fund of Knowledge:Good  Language: Fair  Akathisia:  Negative  Handed:  Right  AIMS (if indicated):     Assets:  Communication Skills Desire for Improvement  Sleep:  Number of Hours: 6.25  Cognition: WNL  ADL's:  Intact   Mental Status Per Nursing Assessment::   On Admission:  Self-harm behaviors  Demographic Factors:  Caucasian  Loss Factors: NA  Historical Factors: NA  Risk Reduction Factors:   Sense of responsibility to family and Religious beliefs about death  Continued Clinical Symptoms:  improved  Cognitive Features That Contribute To Risk:  Polarized thinking    Suicide Risk:  Minimal: No identifiable suicidal ideation.  Patients presenting with no risk factors but with morbid ruminations; may be classified as minimal risk based on the severity of the depressive symptoms  Follow-up Information    Consulting, [facility] Follow up on 05/16/2019.   Specialty:  Behavioral Health Why: Therapy appointment with Cloyd Stagers is Wednesday, 7/22 at 12:00p.  Appt will be virtual and you have the link for your sessions.  Contact information: [place] [address] [phone]        [facility] Follow up on 05/17/2019.   Specialty: Family Medicine Why: Medication management appointment is Thursday, 7/23 at 11:45a.  Please wear a mask and bring your current medications.  Contact information: [address] [place] [address] [phone]           Plan Of Care/Follow-up recommendations:  Activity:  full  Jayelyn Barno, MD 05/14/2019, 9:16 AM
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
## 2019-05-14 NOTE — [facility] Suicide Risk Assessment (Signed)
[facility] INPATIENT:  Family/Significant Other Suicide Prevention Education  Suicide Prevention Education:  Contact Attempts: [employer], husband.  Only way to contact him is through Melody Comas, [phone] has been identified by the patient as the family member/significant other with whom the patient will be residing, and identified as the person(s) who will aid the patient in the event of a mental health crisis.  With written consent from the patient, two attempts were made to provide suicide prevention education, prior to and/or following the patient's discharge.  We were unsuccessful in providing suicide prevention education.  A suicide education pamphlet was given to the patient to share with family/significant other.  Date and time of first attempt:  05/13/2019  /  4:18pm   - CSW did make contact with Arnell Sieving, pt's husband's neighbor, but he stated he was not around the husband at that time so could not help.  CSW asked that he let husband know a CSW will try again tomorrow.  Date and time of second attempt:  05/14/2019 at  8:58am. The call went straight to voicemail. CSW did not leave message, as the voicemail box does not belong to patient's husband.  Joellen Jersey 05/14/2019, 8:58 AM

## 2019-05-14 NOTE — Progress Notes (Signed)
Patient ID: Suzanne Klein, female   DOB: 1980/11/28, 38 y.o.   MRN: [id number]  Discharge Note  Patient denies SI/HI and states readiness for discharge.  Written and verbal discharge instructions reviewed with the patient. Patient accepting to information and verbalized understanding with no concerns. All belongings returned to patient from the unit and secured lockers.  Patient was safely escorted to the lobby for discharge.

## 2019-05-14 NOTE — Progress Notes (Signed)
  [facility] Adult Case Management Discharge Plan :  Will you be returning to the same living situation after discharge:  Yes,  home At discharge, do you have transportation home?: Yes,  neighbor picking up Do you have the ability to pay for your medications: Yes,  Medicaid.  Release of information consent forms completed and in the chart  Patient to Follow up at: Follow-up Information    [facility] on 05/16/2019.   Specialty: Behavioral Health Why: Therapy appointment with Cloyd Stagers is Wednesday, 7/22 at 12:00p.  Appt will be virtual and you have the link for your sessions.  Contact information: [place] [address] [phone]        [facility] Follow up on 05/17/2019.   Specialty: Family Medicine Why: Medication management appointment is Thursday, 7/23 at 11:45a.  Please wear a mask and bring your current medications.  Contact information: [address] [address] [phone]           Next level of care provider has access to [facility] and Suicide Prevention discussed: Yes,  SPE provided to patient, attempted to reach husband twice  Have you used any form of tobacco in the last 30 days? (Cigarettes, Smokeless Tobacco, Cigars, and/or Pipes): Yes  Has patient been referred to the Quitline?: Patient refused referral  Patient has been referred for addiction treatment: Yes  Joellen Jersey, [place] 05/14/2019, 10:12 AM

## 2020-08-12 ENCOUNTER — Encounter (HOSPITAL_COMMUNITY): Payer: Self-pay

## 2020-08-19 ENCOUNTER — Ambulatory Visit (HOSPITAL_BASED_OUTPATIENT_CLINIC_OR_DEPARTMENT_OTHER): Payer: Medicaid Other | Admitting: Pharmacist

## 2020-08-19 ENCOUNTER — Other Ambulatory Visit: Payer: Self-pay

## 2020-08-19 ENCOUNTER — Ambulatory Visit: Payer: Medicaid Other | Attending: Internal Medicine | Admitting: Internal Medicine

## 2020-08-19 ENCOUNTER — Encounter: Payer: Self-pay | Admitting: Internal Medicine

## 2020-08-19 VITALS — BP 122/76 | HR 97 | Resp 16 | Ht 66.0 in | Wt 249.2 lb

## 2020-08-19 DIAGNOSIS — L720 Epidermal cyst: Secondary | ICD-10-CM | POA: Diagnosis not present

## 2020-08-19 DIAGNOSIS — Z716 Tobacco abuse counseling: Secondary | ICD-10-CM | POA: Diagnosis not present

## 2020-08-19 DIAGNOSIS — Z713 Dietary counseling and surveillance: Secondary | ICD-10-CM | POA: Insufficient documentation

## 2020-08-19 DIAGNOSIS — F339 Major depressive disorder, recurrent, unspecified: Secondary | ICD-10-CM | POA: Diagnosis not present

## 2020-08-19 DIAGNOSIS — Z6841 Body Mass Index (BMI) 40.0 and over, adult: Secondary | ICD-10-CM | POA: Insufficient documentation

## 2020-08-19 DIAGNOSIS — F17200 Nicotine dependence, unspecified, uncomplicated: Secondary | ICD-10-CM | POA: Diagnosis not present

## 2020-08-19 DIAGNOSIS — Z23 Encounter for immunization: Secondary | ICD-10-CM

## 2020-08-19 DIAGNOSIS — L729 Follicular cyst of the skin and subcutaneous tissue, unspecified: Secondary | ICD-10-CM | POA: Insufficient documentation

## 2020-08-19 DIAGNOSIS — F4310 Post-traumatic stress disorder, unspecified: Secondary | ICD-10-CM | POA: Diagnosis not present

## 2020-08-19 DIAGNOSIS — F419 Anxiety disorder, unspecified: Secondary | ICD-10-CM | POA: Insufficient documentation

## 2020-08-19 DIAGNOSIS — R0683 Snoring: Secondary | ICD-10-CM

## 2020-08-19 DIAGNOSIS — Z7689 Persons encountering health services in other specified circumstances: Secondary | ICD-10-CM | POA: Diagnosis not present

## 2020-08-19 DIAGNOSIS — R400 Somnolence: Secondary | ICD-10-CM | POA: Diagnosis not present

## 2020-08-19 DIAGNOSIS — Z79899 Other long term (current) drug therapy: Secondary | ICD-10-CM | POA: Insufficient documentation

## 2020-08-19 DIAGNOSIS — J4540 Moderate persistent asthma, uncomplicated: Secondary | ICD-10-CM | POA: Diagnosis not present

## 2020-08-19 DIAGNOSIS — E039 Hypothyroidism, unspecified: Secondary | ICD-10-CM | POA: Diagnosis not present

## 2020-08-19 DIAGNOSIS — F17210 Nicotine dependence, cigarettes, uncomplicated: Secondary | ICD-10-CM | POA: Diagnosis not present

## 2020-08-19 DIAGNOSIS — Z0000 Encounter for general adult medical examination without abnormal findings: Secondary | ICD-10-CM | POA: Diagnosis present

## 2020-08-19 MED ORDER — LEVOTHYROXINE SODIUM 25 MCG PO TABS: 25.0000 ug | ORAL_TABLET | Freq: Every morning | ORAL | 1 refills | Status: DC

## 2020-08-19 MED ORDER — BUDESONIDE-FORMOTEROL FUMARATE 80-4.5 MCG/ACT IN AERO: 2.0000 | INHALATION_SPRAY | Freq: Two times a day (BID) | RESPIRATORY_TRACT | 3 refills | Status: DC

## 2020-08-19 NOTE — Progress Notes (Signed)
Patient presents for vaccination against influenza per orders of Dr. Johnson. Consent given. Counseling provided. No contraindications exists. Vaccine administered without incident.  ° °Luke Van Ausdall, PharmD, CPP °Clinical Pharmacist °[facility] °[phone] ° °

## 2020-08-19 NOTE — Progress Notes (Addendum)
Patient ID: Suzanne Klein, female    DOB: 1981-06-14  MRN: [id number]  CC: New Patient (Initial Visit)   Subjective: Suzanne Klein is a 39 y.o. female who presents for new patient visit to establish care. Her concerns today include:   Previous PCP was at [facility].  Last seen earlier this yr. she decided to switch doctors because she did not feel that the doctors over there were serious and knew what they were doing.  Her husband also comes here for primary care.   Gives history of hypothyroidism, asthma, scoliosis to the lower spine, neuropathy in her legs, IBS, plantar fasciitis, tobacco dependence, PTSD/anxiety/depression  Hypothyroidism: Diagnosed about 2 years ago.  She is on levothyroxine 25 mcg but has been out of it for about a month.  She reports feeling tired a lot.  PTSD/anxiety/depression: Seen by [facility] (Q 4-6 wks, will get new doctor in November) and also sees a counselor 2 times a week.  She is on Lamictal, prazosin, gabapentin, trazodone and Pristiq.  Asthma: She is on albuterol inhaler.  She wakes up at least 3 times during the night having to use the albuterol due to shortness of breath and wheezing.  She is not on any other inhalers.  She smokes which average up to about 1 to 2 packs of cigarettes a day.  She rolls her own cigarettes.  She has smoked since the age of 16.  She tried to quit once and that lasted for less than a week because she became too anxious.  She wants to quit.  She would like to try the nicotine patches but it is not covered by Medicaid.  She reports waking up in the mornings feeling unrefreshed after sleeping 8 hours.  She endorses daytime sleepiness.  No morning headaches.  Endorses loud snoring at night as reported by her husband.  Had sleep study several years ago which she states was negative.  Obesity: She wants to work on losing weight.  She tells me that her weight was usually around 260 pounds but now  she is down to 249 pounds.  She attributes this to eating less.  She walks around her block 2-3 times a week.  Complains of having a lump in the left upper inner thigh x3 years.  It intermittently swells up and drains.  Previous PCP would give her antibiotics when it looks infected.  Past medical, social, family history and surgical history reviewed. Patient Active Problem List   Diagnosis Date Noted  . MDD (major depressive disorder) 05/09/2019  . Major depressive disorder, recurrent severe without psychotic features ([place]) 11/05/2016  . Schizoaffective disorder ([place]) 11/04/2016  . Major depressive disorder, recurrent, severe without psychotic features ([place])   . Posttraumatic stress disorder with dissociative symptoms 11/01/2014  . Pharyngitis 08/21/2013  . Throat pain 08/21/2013  . Rash 08/21/2013  . Cervical insufficiency in pregnancy, antepartum 07/06/2013  . Abnormal quad screen 07/06/2013  . PTSD (post-traumatic stress disorder) 06/29/2013  . Anxiety 06/29/2013  . Borderline personality disorder ([place]) 06/29/2013  . Supervision of other normal pregnancy 05/21/2013  . Previous cesarean section 05/21/2013  . Tobacco smoking complicating pregnancy 11/57/2620     Current Outpatient Medications on File Prior to Visit  Medication Sig Dispense Refill  . desvenlafaxine (PRISTIQ) 50 MG 24 hr tablet Take 50 mg by mouth daily.    Marland Kitchen gabapentin (NEURONTIN) 800 MG tablet Take 800 mg by mouth 3 (three) times daily.    Marland Kitchen lamoTRIgine (LAMICTAL) 100  MG tablet Take 100 mg by mouth 2 (two) times daily.    . montelukast (SINGULAIR) 10 MG tablet Take 10 mg by mouth at bedtime.    . traZODone (DESYREL) 150 MG tablet Take by mouth at bedtime.    Marland Kitchen albuterol (VENTOLIN HFA) 108 (90 Base) MCG/ACT inhaler Inhale 1-2 puffs into the lungs every 4 (four) hours as needed for shortness of breath. (Patient not taking: Reported on 08/19/2020)    . ALBUTEROL IN Inhale into the lungs as needed. (Patient not taking:  Reported on 08/19/2020)    . prazosin (MINIPRESS) 2 MG capsule Take 2 capsules (4 mg total) by mouth at bedtime. (Patient not taking: Reported on 08/19/2020) 30 capsule 0   No current facility-administered medications on file prior to visit.    Allergies  Allergen Reactions  . Banana Anaphylaxis  . Latex Hives  . Poison Oak Extract [Poison Oak Extract] Anaphylaxis and Itching    Anaphylaxis occurs when the plant is burning    . Tomato Anaphylaxis  . Banana Nausea And Vomiting and Rash  . Latex Rash  . Poison Ivy Extract [Poison Ivy Extract] Rash  . Tomato Rash    Social History   Socioeconomic History  . Marital status: Married    Spouse name: Not on file  . Number of children: 1  . Years of education: Not on file  . Highest education level: Not on file  Occupational History  . Occupation: disability  Tobacco Use  . Smoking status: Current Every Day Smoker    Packs/day: 1.50    Years: 21.00    Pack years: 31.50    Types: Cigarettes  . Smokeless tobacco: Current User  Vaping Use  . Vaping Use: Never used  Substance and Sexual Activity  . Alcohol use: Yes    Comment: occas  . Drug use: No  . Sexual activity: Yes    Birth control/protection: None    Comment: pregnant  Other Topics Concern  . Not on file  Social History Narrative  . Not on file   Social Determinants of Health   Financial Resource Strain:   . Difficulty of Paying Living Expenses: Not on file  Food Insecurity:   . Worried About Charity fundraiser in the Last Year: Not on file  . Ran Out of Food in the Last Year: Not on file  Transportation Needs:   . Lack of Transportation (Medical): Not on file  . Lack of Transportation (Non-Medical): Not on file  Physical Activity:   . Days of Exercise per Week: Not on file  . Minutes of Exercise per Session: Not on file  Stress:   . Feeling of Stress : Not on file  Social Connections:   . Frequency of Communication with Friends and Family: Not on file    . Frequency of Social Gatherings with Friends and Family: Not on file  . Attends Religious Services: Not on file  . Active Member of Clubs or Organizations: Not on file  . Attends Archivist Meetings: Not on file  . Marital Status: Not on file  Intimate Partner Violence:   . Fear of Current or Ex-Partner: Not on file  . Emotionally Abused: Not on file  . Physically Abused: Not on file  . Sexually Abused: Not on file    Family History  Problem Relation Age of Onset  . Cancer Mother   . Heart disease Mother   . Dementia Maternal Grandfather   . Anesthesia problems Neg  Hx     Past Surgical History:  Procedure Laterality Date  . CESAREAN SECTION     X 4  . CESAREAN SECTION     x 2  . CESAREAN SECTION  07/21/2012   Procedure: CESAREAN SECTION;  Surgeon: Frederico Hamman, MD;  Location: [facility];  Service: Obstetrics;  Laterality: N/A;  Repeat Cesarean Section Delivery Boy @ [phone], Apgars 9/9  . CESAREAN SECTION    . COLPOSCOPY    . DILATION AND CURETTAGE OF UTERUS    . TOOTH EXTRACTION Bilateral 09/06/2018   Procedure: DENTAL RESTORATION/EXTRACTIONS;  Surgeon: Diona Browner, DDS;  Location: [facility];  Service: Oral Surgery;  Laterality: Bilateral;  . TUBAL LIGATION    . WISDOM TOOTH EXTRACTION  2009  . WISDOM TOOTH EXTRACTION      ROS: Review of Systems Negative except as stated above  PHYSICAL EXAM: BP 122/76   Pulse 97   Resp 16   Ht 5\' 6"  (1.676 m)   Wt 249 lb 3.2 oz (113 kg)   SpO2 96%   BMI 40.22 kg/m   Physical Exam  General appearance - alert, well appearing, obese middle-age Caucasian female and in no distress Mental status -patient is very talkative and has to be redirected. Eyes - pupils equal and reactive, extraocular eye movements intact Nose - normal and patent, no erythema, discharge or polyps Mouth - mucous membranes moist, pharynx normal without lesions Neck - supple, no masses, no thyroid enlargement or nodules  appreciated on exam. Lymphatics -no cervical or axillary lymphadenopathy Chest - clear to auscultation, no wheezes, rales or rhonchi, symmetric air entry Heart - normal rate, regular rhythm, normal S1, S2, no murmurs, rubs, clicks or gallops Extremities -no lower extremity edema Skin -she has a 2 cm soft epidermoid cyst with a track opening.  No erythema at this time.  Nothing expressed from the fistulous tract.  Depression screen PHQ 2/9 08/19/2020  Decreased Interest 2  Down, Depressed, Hopeless 2  PHQ - 2 Score 4  Altered sleeping 2  Tired, decreased energy 2  Change in appetite 2  Feeling bad or failure about yourself  3  Trouble concentrating 3  Moving slowly or fidgety/restless 3  Suicidal thoughts 2  PHQ-9 Score 21   GAD 7 : Generalized Anxiety Score 08/19/2020  Nervous, Anxious, on Edge 3  Control/stop worrying 3  Worry too much - different things 3  Trouble relaxing 3  Restless 3  Easily annoyed or irritable 3  Afraid - awful might happen 3  Total GAD 7 Score 21     CMP Latest Ref Rng & Units 05/10/2019 11/03/2016 09/20/2015  Glucose 70 - 99 mg/dL 109(H) 119(H) 107(H)  BUN 6 - 20 mg/dL 13 16 10   Creatinine 0.44 - 1.00 mg/dL 0.75 0.74 0.73  Sodium 135 - 145 mmol/L 140 141 136  Potassium 3.5 - 5.1 mmol/L 4.4 3.5 4.0  Chloride 98 - 111 mmol/L 106 105 104  CO2 22 - 32 mmol/L 25 28 25   Calcium 8.9 - 10.3 mg/dL 8.8(L) 9.0 8.9  Total Protein 6.5 - 8.1 g/dL 7.2 7.6 6.9  Total Bilirubin 0.3 - 1.2 mg/dL 0.3 0.5 0.4  Alkaline Phos 38 - 126 U/L 67 77 70  AST 15 - 41 U/L 16 19 20   ALT 0 - 44 U/L 15 17 19    Lipid Panel     Component Value Date/Time   CHOL 171 05/10/2019 0627   TRIG 177 (H) 05/10/2019 7893  HDL 30 (L) 05/10/2019 0627   CHOLHDL 5.7 05/10/2019 0627   VLDL 35 05/10/2019 0627   LDLCALC 106 (H) 05/10/2019 0627    CBC    Component Value Date/Time   WBC 7.0 05/10/2019 0627   RBC 4.73 05/10/2019 0627   HGB 13.9 05/10/2019 0627   HGB 11.6 (L)  04/02/2012 2137   HCT 44.8 05/10/2019 0627   HCT 34.2 (L) 04/02/2012 2137   PLT 284 05/10/2019 0627   PLT 193 04/02/2012 2137   MCV 94.7 05/10/2019 0627   MCV 97 04/02/2012 2137   MCH 29.4 05/10/2019 0627   MCHC 31.0 05/10/2019 0627   RDW 14.5 05/10/2019 0627   RDW 13.0 04/02/2012 2137   LYMPHSABS 2.5 10/13/2014 0002   MONOABS 0.6 10/13/2014 0002   EOSABS 0.2 10/13/2014 0002   BASOSABS 0.0 10/13/2014 0002    ASSESSMENT AND PLAN:  1. Encounter to establish care  2. Moderate persistent asthma without complication I recommend starting her on Symbicort for maintenance and continuing albuterol as rescue inhaler.  I went over the difference between the maintenance and a rescue inhaler.  Encouraged her to wash her mouth with warm water after each use of Symbicort to help prevent thrush. - budesonide-formoterol (SYMBICORT) 80-4.5 MCG/ACT inhaler; Inhale 2 puffs into the lungs 2 (two) times daily.  Dispense: 1 each; Refill: 3  3. Tobacco dependence Strongly advised to quit smoking.  Discussed health risks associated with smoking.  Patient wanting to give a trial of quitting.  She is wanting to try the nicotine patches.  I gave her the information to call 1 800 quit now to request the nicotine patches and gum.  Encouraged her to set a quit date.  4. Acquired hypothyroidism - TSH+T4F+T3Free - levothyroxine (SYNTHROID) 25 MCG tablet; Take 1 tablet (25 mcg total) by mouth every morning.  Dispense: 30 tablet; Refill: 1  5. Loud snoring History suggest that she may have obstructive sleep apnea.  We will put her in for a sleep study - PSG Sleep Study; Future  6. Daytime sleepiness See #5 above - PSG Sleep Study; Future  7. Obesity, morbid, BMI 40.0-49.9 ([place]) Commended her on weight loss so far. Dietary counseling given. Encouraged her to continue her walks with goal of eventually getting up to 150 minutes total per week. - Lipid panel - CBC - Comprehensive metabolic panel - Hemoglobin  A1c - Amb ref to Medical Nutrition Therapy-MNT  8. Cyst of skin This looks like a cyst with a track leading out to the surface of the skin.  Since this has caused recurrent infections for her, I will refer her to the general surgeon to have this excised - Ambulatory referral to General Surgery  9. Need for influenza vaccination Given today  Patient wanted to discuss further issues including her back and neuropathy.  I told her that because of our limited time I will have to bring her back for a follow-up visit and we will continue on discussing the other issues that we did not get to today.  She was agreeable to that.   Patient was given the opportunity to ask questions.  Patient verbalized understanding of the plan and was able to repeat key elements of the plan.   Orders Placed This Encounter  Procedures  . TSH+T4F+T3Free  . Lipid panel  . CBC  . Comprehensive metabolic panel  . Hemoglobin A1c  . Amb ref to Medical Nutrition Therapy-MNT  . Ambulatory referral to General Surgery  . PSG Sleep  Study     Requested Prescriptions   Signed Prescriptions Disp Refills  . budesonide-formoterol (SYMBICORT) 80-4.5 MCG/ACT inhaler 1 each 3    Sig: Inhale 2 puffs into the lungs 2 (two) times daily.  Marland Kitchen levothyroxine (SYNTHROID) 25 MCG tablet 30 tablet 1    Sig: Take 1 tablet (25 mcg total) by mouth every morning.    Return in about 1 month (around 09/19/2020).  Karle Plumber, MD, FACP

## 2020-08-19 NOTE — Progress Notes (Signed)
Pt states she has pain all over her body

## 2020-08-19 NOTE — Patient Instructions (Addendum)
We have started you on Symbicort inhaler to use twice a day. You can call 1 800 quit NOW to request free nicotine patches and gum. I have referred you for a sleep study. I have referred you to the nutritionist.   Obesity, Adult Obesity is having too much body fat. Being obese means that your weight is more than what is healthy for you. BMI is a number that explains how much body fat you have. If you have a BMI of 30 or more, you are obese. Obesity is often caused by eating or drinking more calories than your body uses. Changing your lifestyle can help you lose weight. Obesity can cause serious health problems, such as:  Stroke.  Coronary artery disease (CAD).  Type 2 diabetes.  Some types of cancer, including cancers of the colon, breast, uterus, and gallbladder.  Osteoarthritis.  High blood pressure (hypertension).  High cholesterol.  Sleep apnea.  Gallbladder stones.  Infertility problems. What are the causes?  Eating meals each day that are high in calories, sugar, and fat.  Being born with genes that may make you more likely to become obese.  Having a medical condition that causes obesity.  Taking certain medicines.  Sitting a lot (having a sedentary lifestyle).  Not getting enough sleep.  Drinking a lot of drinks that have sugar in them. What increases the risk?  Having a family history of obesity.  Being an [place] American woman.  Being a Hispanic man.  Living in an area with limited access to: ? [place]ilda Garret, recreation centers, or sidewalks. ? Healthy food choices, such as grocery stores and farmers' markets. What are the signs or symptoms? The main sign is having too much body fat. How is this treated?  Treatment for this condition often includes changing your lifestyle. Treatment may include: ? Changing your diet. This may include making a healthy meal plan. ? Exercise. This may include activity that causes your heart to beat faster (aerobic exercise)  and strength training. Work with your doctor to design a program that works for you. ? Medicine to help you lose weight. This may be used if you are not able to lose 1 pound a week after 6 weeks of healthy eating and more exercise. ? Treating conditions that cause the obesity. ? Surgery. Options may include gastric banding and gastric bypass. This may be done if:  Other treatments have not helped to improve your condition.  You have a BMI of 40 or higher.  You have life-threatening health problems related to obesity. Follow these instructions at home: Eating and drinking   Follow advice from your doctor about what to eat and drink. Your doctor may tell you to: ? Limit fast food, sweets, and processed snack foods. ? Choose low-fat options. For example, choose low-fat milk instead of whole milk. ? Eat 5 or more servings of fruits or vegetables each day. ? Eat at home more often. This gives you more control over what you eat. ? Choose healthy foods when you eat out. ? Learn to read food labels. This will help you learn how much food is in 1 serving. ? Keep low-fat snacks available. ? Avoid drinks that have a lot of sugar in them. These include soda, fruit juice, iced tea with sugar, and flavored milk.  Drink enough water to keep your pee (urine) pale yellow.  Do not go on fad diets. Physical activity  Exercise often, as told by your doctor. Most adults should get up to 150  minutes of moderate-intensity exercise every week.Ask your doctor: ? What types of exercise are safe for you. ? How often you should exercise.  Warm up and stretch before being active.  Do slow stretching after being active (cool down).  Rest between times of being active. Lifestyle  Work with your doctor and a food expert (dietitian) to set a weight-loss goal that is best for you.  Limit your screen time.  Find ways to reward yourself that do not involve food.  Do not drink alcohol if: ? Your doctor  tells you not to drink. ? You are pregnant, may be pregnant, or are planning to become pregnant.  If you drink alcohol: ? Limit how much you use to:  0-1 drink a day for women.  0-2 drinks a day for men. ? Be aware of how much alcohol is in your drink. In the [place] one drink equals one 12 oz bottle of beer (355 mL), one 5 oz glass of wine (148 mL), or one 1 oz glass of hard liquor (44 mL). General instructions  Keep a weight-loss journal. This can help you keep track of: ? The food that you eat. ? How much exercise you get.  Take over-the-counter and prescription medicines only as told by your doctor.  Take vitamins and supplements only as told by your doctor.  Think about joining a support group.  Keep all follow-up visits as told by your doctor. This is important. Contact a doctor if:  You cannot meet your weight loss goal after you have changed your diet and lifestyle for 6 weeks. Get help right away if you:  Are having trouble breathing.  Are having thoughts of harming yourself. Summary  Obesity is having too much body fat.  Being obese means that your weight is more than what is healthy for you.  Work with your doctor to set a weight-loss goal.  Get regular exercise as told by your doctor. This information is not intended to replace advice given to you by your health care provider. Make sure you discuss any questions you have with your health care provider. Document Revised: 06/15/2018 Document Reviewed: 06/15/2018 Elsevier Patient Education  [place]. Influenza Virus Vaccine injection (Fluarix) What is this medicine? INFLUENZA VIRUS VACCINE (in floo EN zuh VAHY ruhs vak SEEN) helps to reduce the risk of getting influenza also known as the flu. This medicine may be used for other purposes; ask your health care provider or pharmacist if you have questions. COMMON BRAND NAME(S): Fluarix, Fluzone What should I tell my health care provider before I take  this medicine? They need to know if you have any of these conditions:  bleeding disorder like hemophilia  fever or infection  Guillain-Barre syndrome or other neurological problems  immune system problems  infection with the human immunodeficiency virus (HIV) or AIDS  low blood platelet counts  multiple sclerosis  an unusual or allergic reaction to influenza virus vaccine, eggs, chicken proteins, latex, gentamicin, other medicines, foods, dyes or preservatives  pregnant or trying to get pregnant  breast-feeding How should I use this medicine? This vaccine is for injection into a muscle. It is given by a health care professional. A copy of Vaccine Information Statements will be given before each vaccination. Read this sheet carefully each time. The sheet may change frequently. Talk to your pediatrician regarding the use of this medicine in children. Special care may be needed. Overdosage: If you think you have taken too much of this medicine  contact a poison control center or emergency room at once. NOTE: This medicine is only for you. Do not share this medicine with others. What if I miss a dose? This does not apply. What may interact with this medicine?  chemotherapy or radiation therapy  medicines that lower your immune system like etanercept, anakinra, infliximab, and adalimumab  medicines that treat or prevent blood clots like warfarin  phenytoin  steroid medicines like prednisone or cortisone  theophylline  vaccines This list may not describe all possible interactions. Give your health care provider a list of all the medicines, herbs, non-prescription drugs, or dietary supplements you use. Also tell them if you smoke, drink alcohol, or use illegal drugs. Some items may interact with your medicine. What should I watch for while using this medicine? Report any side effects that do not go away within 3 days to your doctor or health care professional. Call your health  care provider if any unusual symptoms occur within 6 weeks of receiving this vaccine. You may still catch the flu, but the illness is not usually as bad. You cannot get the flu from the vaccine. The vaccine will not protect against colds or other illnesses that may cause fever. The vaccine is needed every year. What side effects may I notice from receiving this medicine? Side effects that you should report to your doctor or health care professional as soon as possible:  allergic reactions like skin rash, itching or hives, swelling of the face, lips, or tongue Side effects that usually do not require medical attention (report to your doctor or health care professional if they continue or are bothersome):  fever  headache  muscle aches and pains  pain, tenderness, redness, or swelling at site where injected  weak or tired This list may not describe all possible side effects. Call your doctor for medical advice about side effects. You may report side effects to FDA at 1-800-FDA-1088. Where should I keep my medicine? This vaccine is only given in a clinic, pharmacy, doctor's office, or other health care setting and will not be stored at home. NOTE: This sheet is a summary. It may not cover all possible information. If you have questions about this medicine, talk to your doctor, pharmacist, or health care provider.  2020 Elsevier/Gold Standard (2008-05-08 09:30:40)

## 2020-08-20 ENCOUNTER — Telehealth: Payer: Self-pay

## 2020-08-20 LAB — COMPREHENSIVE METABOLIC PANEL
ALT: 14 IU/L (ref 0–32)
AST: 12 IU/L (ref 0–40)
Albumin/Globulin Ratio: 1.9 (ref 1.2–2.2)
Albumin: 4.7 g/dL (ref 3.8–4.8)
Alkaline Phosphatase: 106 IU/L (ref 44–121)
BUN/Creatinine Ratio: 13 (ref 9–23)
BUN: 10 mg/dL (ref 6–20)
Bilirubin Total: <0.2 mg/dL (ref 0.0–1.2)
CO2: 25 mmol/L (ref 20–29)
Calcium: 9.3 mg/dL (ref 8.7–10.2)
Chloride: 104 mmol/L (ref 96–106)
Creatinine, Ser: 0.79 mg/dL (ref 0.57–1.00)
GFR calc Af Amer: 109 mL/min/{1.73_m2} (ref >59)
GFR calc non Af Amer: 95 mL/min/{1.73_m2} (ref >59)
Globulin, Total: 2.5 g/dL (ref 1.5–4.5)
Glucose: 93 mg/dL (ref 65–99)
Potassium: 4.9 mmol/L (ref 3.5–5.2)
Sodium: 141 mmol/L (ref 134–144)
Total Protein: 7.2 g/dL (ref 6.0–8.5)

## 2020-08-20 LAB — HEMOGLOBIN A1C
Est. average glucose Bld gHb Est-mCnc: 126 mg/dL
Hgb A1c MFr Bld: 6 % — ABNORMAL HIGH (ref 4.8–5.6)

## 2020-08-20 LAB — CBC
Hematocrit: 41.8 % (ref 34.0–46.6)
Hemoglobin: 14.1 g/dL (ref 11.1–15.9)
MCH: 31.1 pg (ref 26.6–33.0)
MCHC: 33.7 g/dL (ref 31.5–35.7)
MCV: 92 fL (ref 79–97)
Platelets: 302 10*3/uL (ref 150–450)
RBC: 4.53 x10E6/uL (ref 3.77–5.28)
RDW: 13.7 % (ref 11.7–15.4)
WBC: 9.2 10*3/uL (ref 3.4–10.8)

## 2020-08-20 LAB — LIPID PANEL
Chol/HDL Ratio: 5 ratio — ABNORMAL HIGH (ref 0.0–4.4)
Cholesterol, Total: 180 mg/dL (ref 100–199)
HDL: 36 mg/dL — ABNORMAL LOW (ref >39)
LDL Chol Calc (NIH): 121 mg/dL — ABNORMAL HIGH (ref 0–99)
Triglycerides: 128 mg/dL (ref 0–149)
VLDL Cholesterol Cal: 23 mg/dL (ref 5–40)

## 2020-08-20 LAB — TSH+T4F+T3FREE
Free T4: 1.02 ng/dL (ref 0.82–1.77)
T3, Free: 3.2 pg/mL (ref 2.0–4.4)
TSH: 4.59 u[IU]/mL — ABNORMAL HIGH (ref 0.450–4.500)

## 2020-08-20 NOTE — Telephone Encounter (Signed)
Contacted pt to go over lab results pt is aware and doesn't have any questions or concerns 

## 2020-08-20 NOTE — Progress Notes (Signed)
Let patient know that her thyroid level is mildly elevated.  I have sent a refill on her levothyroxine.  She has prediabetes.  Her LDL cholesterol is 121 with goal being less than 100.  Healthy eating habits and regular exercise as discussed on recent visit will help to lower cholesterol and prevent progression to diabetes.  Her blood cell counts including white, red and platelet counts are normal.  Her kidney and liver function tests are normal.

## 2020-09-09 ENCOUNTER — Telehealth: Payer: Self-pay

## 2020-09-09 NOTE — Telephone Encounter (Signed)
Copied from [place] [id number]. Topic: General - Other >> Sep 09, 2020  8:49 AM Leward Quan A wrote: Reason for CRM: Patient called in to inform Dr that she have been waking up for the past week since 09/01/20  with severe anxiety, nausea and vomiting all day along with severe headaches. Ph# [phone]

## 2020-09-09 NOTE — Telephone Encounter (Signed)
Will forward to pcp

## 2020-09-09 NOTE — Telephone Encounter (Signed)
Please contact pt and schedule an appt with any available provider  

## 2020-09-15 ENCOUNTER — Ambulatory Visit: Payer: Medicaid Other | Admitting: Family Medicine

## 2020-09-16 ENCOUNTER — Encounter: Payer: Medicaid Other | Attending: Internal Medicine | Admitting: Dietician

## 2020-09-20 NOTE — Progress Notes (Signed)
Subjective:    Patient ID: Suzanne Klein, female    DOB: 1981-04-05, 39 y.o.   MRN: [id number]  Virtual Visit via Telephone Note  I connected with [name] on 09/22/20 at  2:00 PM EST by telephone and verified that I am speaking with the correct person using two identifiers.   Consent:  I discussed the limitations, risks, security and privacy concerns of performing an evaluation and management service by telephone and the availability of in person appointments. I also discussed with the patient that there may be a patient responsible charge related to this service. The patient expressed understanding and agreed to proceed.  Location of patient: Patient is at home  Location of provider: I am in my office  Persons participating in the televisit with the patient.   Husband is on the call    History of Present Illness: 09/22/20 This is a telephone visit with a primary care patient of Dr. Durenda Age.  Note we attempted to get video connection but her phone would not allow a video connection.  Patient is noted the subacute onset of headaches and nausea and vomiting.  She notes this is proximate to when she had her Pristiq dose increased from 50 to 100 mg daily.  She does have history of schizoaffective disorder along with depression and posttraumatic stress disorder.  Note the patient also has significant drowsiness loud snoring and has a sleep study that is pending.  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  Patient is still smoking 6 to 8 cigarettes daily.  She notes increased high-level reflux with throat irritation.  History of hypothyroidism with thyroid dose of 25 mcg daily in recent thyroid function showing still mild hypothyroidism.  Patient also notes significant migraines associated with excess menstrual periods  Patient  denies any abdominal pain or abdominal distention at this time.  There is it history of irritable bowel syndrome in the past     Past Medical History:  Diagnosis Date  . Abnormal Pap smear   . Anxiety   . Asthma   . Bipolar disorder ([place])   . Borderline personality disorder ([place])    No meds  . Depression   . Heartburn in pregnancy   . HPV in female   . HPV in female   . Hypothyroidism    takes synthroid but not taking currently  . Migraines   . Panic anxiety syndrome    no meds  . PTSD (post-traumatic stress disorder)    No meds  . Schizoaffective disorder ([place])   . Scoliosis of lumbar spine   . Termination of pregnancy    x 1     Family History  Problem Relation Age of Onset  . Cancer Mother   . Heart disease Mother   . Dementia Maternal Grandfather   . Anesthesia problems Neg Hx      Social History   Socioeconomic History  . Marital status: Married    Spouse name: Not on file  . Number of children: 1  . Years of education: Not on file  . Highest education level: Not on file  Occupational History  . Occupation: disability  Tobacco Use  . Smoking status: Current Every Day Smoker    Packs/day: 1.50    Years: 21.00    Pack years: 31.50    Types: Cigarettes  .  Smokeless tobacco: Current User  Vaping Use  . Vaping Use: Never used  Substance and Sexual Activity  . Alcohol use: Yes    Comment: occas  . Drug use: No  . Sexual activity: Yes    Birth control/protection: None    Comment: pregnant  Other Topics Concern  . Not on file  Social History Narrative  . Not on file   Social Determinants of Health   Financial Resource Strain:   . Difficulty of Paying Living Expenses: Not on file  Food Insecurity:   . Worried About Charity fundraiser in the Last Year: Not on file  . Ran Out of Food in the Last Year: Not on file  Transportation Needs:   . Lack of Transportation (Medical): Not on file  . Lack of Transportation (Non-Medical): Not on file   Physical Activity:   . Days of Exercise per Week: Not on file  . Minutes of Exercise per Session: Not on file  Stress:   . Feeling of Stress : Not on file  Social Connections:   . Frequency of Communication with Friends and Family: Not on file  . Frequency of Social Gatherings with Friends and Family: Not on file  . Attends Religious Services: Not on file  . Active Member of Clubs or Organizations: Not on file  . Attends Archivist Meetings: Not on file  . Marital Status: Not on file  Intimate Partner Violence:   . Fear of Current or Ex-Partner: Not on file  . Emotionally Abused: Not on file  . Physically Abused: Not on file  . Sexually Abused: Not on file     Allergies  Allergen Reactions  . Banana Anaphylaxis  . Latex Hives  . Poison Oak Extract [Poison Oak Extract] Anaphylaxis and Itching    Anaphylaxis occurs when the plant is burning    . Tomato Anaphylaxis  . Banana Nausea And Vomiting and Rash  . Latex Rash  . Poison Ivy Extract [Poison Ivy Extract] Rash  . Tomato Rash     Outpatient Medications Prior to Visit  Medication Sig Dispense Refill  . budesonide-formoterol (SYMBICORT) 80-4.5 MCG/ACT inhaler Inhale 2 puffs into the lungs 2 (two) times daily. 1 each 3  . gabapentin (NEURONTIN) 800 MG tablet Take 800 mg by mouth 3 (three) times daily.    . montelukast (SINGULAIR) 10 MG tablet Take 10 mg by mouth at bedtime.    . prazosin (MINIPRESS) 2 MG capsule Take 2 capsules (4 mg total) by mouth at bedtime. 30 capsule 0  . traZODone (DESYREL) 150 MG tablet Take 225 mg by mouth at bedtime.     Marland Kitchen desvenlafaxine (PRISTIQ) 100 MG 24 hr tablet Take 50 mg by mouth daily.    Marland Kitchen lamoTRIgine (LAMICTAL) 100 MG tablet Take 100 mg by mouth daily. 1/2 tablet daily    . levothyroxine (SYNTHROID) 25 MCG tablet Take 1 tablet (25 mcg total) by mouth every morning. 30 tablet 1  . busPIRone (BUSPAR) 10 MG tablet SMARTSIG:1 Tablet(s) By Mouth Morning-Evening (Patient not taking:  Reported on 09/22/2020)    . albuterol (VENTOLIN HFA) 108 (90 Base) MCG/ACT inhaler Inhale 1-2 puffs into the lungs every 4 (four) hours as needed for shortness of breath. (Patient not taking: Reported on 08/19/2020)    . ALBUTEROL IN Inhale into the lungs as needed. (Patient not taking: Reported on 08/19/2020)     No facility-administered medications prior to visit.       Review of Systems  HENT: Positive for sore throat. Negative for ear discharge and ear pain.   Respiratory: Negative for cough, choking, chest tightness, shortness of breath, wheezing and stridor.   Gastrointestinal: Positive for abdominal pain, constipation, diarrhea, nausea and vomiting. Negative for abdominal distention, anal bleeding, blood in stool and rectal pain.  Endocrine: Positive for cold intolerance and heat intolerance.  Genitourinary: Positive for menstrual problem.       Heavy menses  Musculoskeletal: Positive for back pain.  Skin: Negative.   Neurological: Positive for light-headedness and headaches. Negative for dizziness, tremors, seizures, weakness and numbness.       Migraines.   Slightly worse  Neuropathy is worse  Psychiatric/Behavioral: Positive for agitation, behavioral problems, confusion, decreased concentration, dysphoric mood and sleep disturbance. Negative for hallucinations, self-injury and suicidal ideas. The patient is nervous/anxious and is hyperactive.        Night terrors       Objective:   Physical Exam  No exam this is a phone visit      Assessment & Plan:  I personally reviewed all images and lab data in the [facility] system as well as any outside material available during this office visit and agree with the  radiology impressions.   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 Current smoking consumption amount: 6 to 8 cigarettes daily   Dicsussion on advise to quit smoking and smoking impacts: Impacts on lung health  . Patient's willingness to quit: Uncertain if she is willing to quit as of yet  . Methods to quit smoking discussed: Behavioral modification nicotine replacement  . Medication management of smoking session drugs discussed: Nicotine replacement  . Resources provided:  AVS   . Setting quit date not yet established  . Follow-up arranged follow-up with primary care in a month   Time spent counseling the patient:      GERD (gastroesophageal reflux disease) Begin proton pump inhibitor and given diet  Migraine without aura and without status migrainosus, not intractable Referral to neurology given  Menorrhagia with irregular cycle Referral to gynecology made   Tria was seen today for nausea.  Diagnoses and all orders for this visit:  Migraine without aura and without status migrainosus, not intractable -     Ambulatory referral to Neurology  Menorrhagia with irregular cycle -     Ambulatory referral to Gynecology  Acquired hypothyroidism -     levothyroxine (SYNTHROID) 50 MCG tablet; Take 1 tablet (50 mcg total) by mouth every morning.  Bipolar 1 disorder (HCC)  Nausea  Tobacco use  Gastroesophageal reflux disease without esophagitis  Other orders -     ondansetron (ZOFRAN) 4 MG tablet; Take 1 tablet (4 mg total) by mouth every 8 (eight) hours as needed for nausea or vomiting. -     pantoprazole  (PROTONIX) 40 MG tablet; Take 1 tablet (40 mg total) by mouth daily  before breakfast. -     desvenlafaxine (PRISTIQ) 100 MG 24 hr tablet; Take 1 tablet (100 mg total) by mouth daily.     Follow Up Instructions: Patient knows medications were sent to her local pharmacy and a follow-up visit with Dr. Wynetta Emery will be arranged also referrals to gynecology and neurology were sent   I discussed the assessment and treatment plan with the patient. The patient was provided an opportunity to ask questions and all were answered. The patient agreed with the plan and demonstrated an understanding of the instructions.   The patient was advised to call back or seek an in-person evaluation if the symptoms worsen or if the condition fails to improve as anticipated.  I provided 30 minutes of non-face-to-face time during this encounter  including  median intraservice time , review of notes, labs, imaging, medications  and explaining diagnosis and management to the patient .    Asencion Noble, MD

## 2020-09-22 ENCOUNTER — Ambulatory Visit: Payer: Medicaid Other | Attending: Family Medicine | Admitting: Critical Care Medicine

## 2020-09-22 ENCOUNTER — Encounter: Payer: Self-pay | Admitting: Critical Care Medicine

## 2020-09-22 DIAGNOSIS — Z7951 Long term (current) use of inhaled steroids: Secondary | ICD-10-CM | POA: Diagnosis not present

## 2020-09-22 DIAGNOSIS — Z720 Tobacco use: Secondary | ICD-10-CM | POA: Insufficient documentation

## 2020-09-22 DIAGNOSIS — F17210 Nicotine dependence, cigarettes, uncomplicated: Secondary | ICD-10-CM | POA: Insufficient documentation

## 2020-09-22 DIAGNOSIS — F4310 Post-traumatic stress disorder, unspecified: Secondary | ICD-10-CM | POA: Diagnosis not present

## 2020-09-22 DIAGNOSIS — F319 Bipolar disorder, unspecified: Secondary | ICD-10-CM | POA: Insufficient documentation

## 2020-09-22 DIAGNOSIS — K219 Gastro-esophageal reflux disease without esophagitis: Secondary | ICD-10-CM | POA: Insufficient documentation

## 2020-09-22 DIAGNOSIS — Z79890 Hormone replacement therapy (postmenopausal): Secondary | ICD-10-CM | POA: Diagnosis not present

## 2020-09-22 DIAGNOSIS — R110 Nausea: Secondary | ICD-10-CM | POA: Diagnosis not present

## 2020-09-22 DIAGNOSIS — E039 Hypothyroidism, unspecified: Secondary | ICD-10-CM | POA: Diagnosis not present

## 2020-09-22 DIAGNOSIS — N921 Excessive and frequent menstruation with irregular cycle: Secondary | ICD-10-CM | POA: Diagnosis not present

## 2020-09-22 DIAGNOSIS — F603 Borderline personality disorder: Secondary | ICD-10-CM | POA: Insufficient documentation

## 2020-09-22 DIAGNOSIS — G43009 Migraine without aura, not intractable, without status migrainosus: Secondary | ICD-10-CM | POA: Diagnosis present

## 2020-09-22 DIAGNOSIS — F259 Schizoaffective disorder, unspecified: Secondary | ICD-10-CM | POA: Insufficient documentation

## 2020-09-22 DIAGNOSIS — Z79899 Other long term (current) drug therapy: Secondary | ICD-10-CM | POA: Diagnosis not present

## 2020-09-22 MED ORDER — ONDANSETRON HCL 4 MG PO TABS: 4.0000 mg | ORAL_TABLET | Freq: Three times a day (TID) | ORAL | 0 refills | Status: DC | PRN

## 2020-09-22 MED ORDER — DESVENLAFAXINE SUCCINATE ER 100 MG PO TB24: 100.0000 mg | ORAL_TABLET | Freq: Every day | ORAL | 3 refills | Status: DC

## 2020-09-22 MED ORDER — LEVOTHYROXINE SODIUM 50 MCG PO TABS: 50.0000 ug | ORAL_TABLET | Freq: Every morning | ORAL | 6 refills | Status: DC

## 2020-09-22 MED ORDER — PANTOPRAZOLE SODIUM 40 MG PO TBEC: 40.0000 mg | DELAYED_RELEASE_TABLET | Freq: Every day | ORAL | 3 refills | Status: DC

## 2020-09-22 NOTE — Assessment & Plan Note (Signed)
Severe nausea with reflux symptoms will begin Protonix 40 mg daily give Zofran as needed and asked the patient to contact psychiatry reduce dose of Pristiq

## 2020-09-22 NOTE — Assessment & Plan Note (Signed)
Significant bipolar disorder with associated major depression without psychotic features and posttraumatic stress disorder  I suspect the Pristiq is causing the nausea and I asked the patient to contact her psychiatrist with regards to reducing the dose back down to 50 mg a day as I believe this is one of the culprits

## 2020-09-22 NOTE — Assessment & Plan Note (Signed)
History of hypothyroidism based on recent thyroid function we will increase Synthroid to 50 mcg daily and follow-up

## 2020-09-22 NOTE — Progress Notes (Signed)
Extreme nausea all day Does not think she could be pregnant tubal ligation  Neuropathy pain has increased wants Gabapentin increased  Pt needs refills on all medications will be going out of [place] next [place]

## 2020-09-22 NOTE — Assessment & Plan Note (Signed)
Begin proton pump inhibitor and given diet

## 2020-09-22 NOTE — Assessment & Plan Note (Signed)
Referral to gynecology made 

## 2020-09-22 NOTE — Assessment & Plan Note (Signed)
Referral to neurology given

## 2020-09-22 NOTE — Assessment & Plan Note (Signed)
  .   Current smoking consumption amount: 6 to 8 cigarettes daily   Dicsussion on advise to quit smoking and smoking impacts: Impacts on lung health  . Patient's willingness to quit: Uncertain if she is willing to quit as of yet  . Methods to quit smoking discussed: Behavioral modification nicotine replacement  . Medication management of smoking session drugs discussed: Nicotine replacement  . Resources provided:  AVS   . Setting quit date not yet established  . Follow-up arranged follow-up with primary care in a month   Time spent counseling the patient:

## 2020-09-24 ENCOUNTER — Encounter: Payer: Self-pay | Admitting: Neurology

## 2020-09-25 ENCOUNTER — Ambulatory Visit: Payer: Medicaid Other | Admitting: Family

## 2020-10-01 ENCOUNTER — Ambulatory Visit: Payer: Medicaid Other | Admitting: Physician Assistant

## 2020-10-10 ENCOUNTER — Ambulatory Visit: Payer: Medicaid Other | Admitting: Internal Medicine

## 2020-10-27 ENCOUNTER — Ambulatory Visit (HOSPITAL_BASED_OUTPATIENT_CLINIC_OR_DEPARTMENT_OTHER): Payer: Medicaid Other | Admitting: Internal Medicine

## 2020-11-22 ENCOUNTER — Encounter (HOSPITAL_COMMUNITY): Payer: Self-pay | Admitting: Emergency Medicine

## 2020-11-22 ENCOUNTER — Emergency Department (HOSPITAL_COMMUNITY): Payer: Medicaid Other

## 2020-11-22 ENCOUNTER — Emergency Department (HOSPITAL_COMMUNITY)
Admission: EM | Admit: 2020-11-22 | Discharge: 2020-11-22 | Disposition: A | Discharge status: Home / Self Care | Payer: Medicaid Other | Attending: Emergency Medicine | Admitting: Emergency Medicine

## 2020-11-22 ENCOUNTER — Other Ambulatory Visit: Payer: Self-pay

## 2020-11-22 DIAGNOSIS — R1084 Generalized abdominal pain: Secondary | ICD-10-CM | POA: Diagnosis not present

## 2020-11-22 DIAGNOSIS — J45909 Unspecified asthma, uncomplicated: Secondary | ICD-10-CM | POA: Diagnosis not present

## 2020-11-22 DIAGNOSIS — R000 Tachycardia, unspecified: Secondary | ICD-10-CM | POA: Insufficient documentation

## 2020-11-22 DIAGNOSIS — R112 Nausea with vomiting, unspecified: Secondary | ICD-10-CM | POA: Diagnosis present

## 2020-11-22 DIAGNOSIS — Z7951 Long term (current) use of inhaled steroids: Secondary | ICD-10-CM | POA: Diagnosis not present

## 2020-11-22 DIAGNOSIS — F17210 Nicotine dependence, cigarettes, uncomplicated: Secondary | ICD-10-CM | POA: Diagnosis not present

## 2020-11-22 DIAGNOSIS — K219 Gastro-esophageal reflux disease without esophagitis: Secondary | ICD-10-CM | POA: Insufficient documentation

## 2020-11-22 DIAGNOSIS — Z20822 Contact with and (suspected) exposure to covid-19: Secondary | ICD-10-CM | POA: Insufficient documentation

## 2020-11-22 DIAGNOSIS — R197 Diarrhea, unspecified: Secondary | ICD-10-CM | POA: Insufficient documentation

## 2020-11-22 DIAGNOSIS — Z79899 Other long term (current) drug therapy: Secondary | ICD-10-CM | POA: Insufficient documentation

## 2020-11-22 DIAGNOSIS — E039 Hypothyroidism, unspecified: Secondary | ICD-10-CM | POA: Insufficient documentation

## 2020-11-22 DIAGNOSIS — Z91040 Latex allergy status: Secondary | ICD-10-CM | POA: Diagnosis not present

## 2020-11-22 LAB — COMPREHENSIVE METABOLIC PANEL
ALT: 15 U/L (ref 0–44)
AST: 17 U/L (ref 15–41)
Albumin: 3.8 g/dL (ref 3.5–5.0)
Alkaline Phosphatase: 60 U/L (ref 38–126)
Anion gap: 12 (ref 5–15)
BUN: 9 mg/dL (ref 6–20)
CO2: 22 mmol/L (ref 22–32)
Calcium: 9.2 mg/dL (ref 8.9–10.3)
Chloride: 105 mmol/L (ref 98–111)
Creatinine, Ser: 1.01 mg/dL — ABNORMAL HIGH (ref 0.44–1.00)
GFR, Estimated: >60 mL/min (ref >60)
Glucose, Bld: 135 mg/dL — ABNORMAL HIGH (ref 70–99)
Potassium: 4.1 mmol/L (ref 3.5–5.1)
Sodium: 139 mmol/L (ref 135–145)
Total Bilirubin: 0.7 mg/dL (ref 0.3–1.2)
Total Protein: 6.6 g/dL (ref 6.5–8.1)

## 2020-11-22 LAB — CBC
HCT: 42.6 % (ref 36.0–46.0)
Hemoglobin: 14.6 g/dL (ref 12.0–15.0)
MCH: 30.7 pg (ref 26.0–34.0)
MCHC: 34.3 g/dL (ref 30.0–36.0)
MCV: 89.7 fL (ref 80.0–100.0)
Platelets: 283 10*3/uL (ref 150–400)
RBC: 4.75 MIL/uL (ref 3.87–5.11)
RDW: 13.4 % (ref 11.5–15.5)
WBC: 6.6 10*3/uL (ref 4.0–10.5)
nRBC: 0 % (ref 0.0–0.2)

## 2020-11-22 LAB — I-STAT BETA HCG BLOOD, ED (MC, WL, AP ONLY): I-stat hCG, quantitative: <5 m[IU]/mL (ref <5)

## 2020-11-22 LAB — SARS CORONAVIRUS 2 (TAT 6-24 HRS): SARS Coronavirus 2: NEGATIVE

## 2020-11-22 LAB — LIPASE, BLOOD: Lipase: 25 U/L (ref 11–51)

## 2020-11-22 MED ORDER — DICYCLOMINE HCL 10 MG/ML IM SOLN: 20.0000 mg | Freq: Once | INTRAMUSCULAR | Status: DC

## 2020-11-22 MED ORDER — MORPHINE SULFATE (PF) 4 MG/ML IV SOLN
4.0000 mg | Freq: Once | INTRAVENOUS | Status: AC
  Administered 2020-11-22: 4 mg via INTRAVENOUS
  Filled 2020-11-22: qty 1

## 2020-11-22 MED ORDER — ALUM & MAG HYDROXIDE-SIMETH 200-200-20 MG/5ML PO SUSP
30.0000 mL | Freq: Once | ORAL | Status: AC
  Administered 2020-11-22: 30 mL via ORAL
  Filled 2020-11-22: qty 30

## 2020-11-22 MED ORDER — LIDOCAINE VISCOUS HCL 2 % MT SOLN
15.0000 mL | Freq: Once | OROMUCOSAL | Status: AC
  Administered 2020-11-22: 15 mL via ORAL
  Filled 2020-11-22: qty 15

## 2020-11-22 MED ORDER — FAMOTIDINE IN NACL 20-0.9 MG/50ML-% IV SOLN
20.0000 mg | Freq: Once | INTRAVENOUS | Status: AC
  Administered 2020-11-22: 20 mg via INTRAVENOUS
  Filled 2020-11-22: qty 50

## 2020-11-22 MED ORDER — HYOSCYAMINE SULFATE 0.125 MG SL SUBL
0.2500 mg | SUBLINGUAL_TABLET | Freq: Once | SUBLINGUAL | Status: AC
  Administered 2020-11-22: 0.25 mg via SUBLINGUAL
  Filled 2020-11-22: qty 2

## 2020-11-22 MED ORDER — ONDANSETRON 4 MG PO TBDP: 4.0000 mg | ORAL_TABLET | Freq: Three times a day (TID) | ORAL | 0 refills | Status: DC | PRN

## 2020-11-22 MED ORDER — IOHEXOL 300 MG/ML  SOLN
100.0000 mL | Freq: Once | INTRAMUSCULAR | Status: AC | PRN
  Administered 2020-11-22: 100 mL via INTRAVENOUS

## 2020-11-22 MED ORDER — SODIUM CHLORIDE 0.9 % IV BOLUS
1000.0000 mL | Freq: Once | INTRAVENOUS | Status: AC
  Administered 2020-11-22: 1000 mL via INTRAVENOUS

## 2020-11-22 MED ORDER — DICYCLOMINE HCL 20 MG PO TABS: 20.0000 mg | ORAL_TABLET | Freq: Three times a day (TID) | ORAL | 0 refills | Status: DC | PRN

## 2020-11-22 MED ORDER — ONDANSETRON HCL 4 MG/2ML IJ SOLN
4.0000 mg | Freq: Once | INTRAMUSCULAR | Status: AC
  Administered 2020-11-22: 4 mg via INTRAVENOUS
  Filled 2020-11-22: qty 2

## 2020-11-22 MED ORDER — SUCRALFATE 1 GM/10ML PO SUSP: 1.0000 g | Freq: Three times a day (TID) | ORAL | 0 refills | Status: AC

## 2020-11-22 NOTE — ED Notes (Signed)
Pt given ginger ale for Fluid challenge at this time.

## 2020-11-22 NOTE — ED Provider Notes (Signed)
[facility] Provider Note   CSN: [id number] Arrival date & time: 11/22/20  1023     History Chief Complaint  Patient presents with  . Abdominal Pain    Suzanne Klein is a 40 y.o. female with a hx of anxiety, asthma, borderline personality disorder, PTSD, schizoaffective disorder, and prior C-sections & tubal ligation who presents to the ED with complaints of N/V/D and abdominal pain x 4 days. Patient states she is having 7-8 episodes of emesis and many episodes of diarrhea per day, each of which are non bloody. Has associated constant generalized abdominal pain, worse with emesis, no alleviating factors. Feels hot/cold but no fevers at home. Denies hematemesis, melena, hematochezia, dysuria, urgency, frequency, or vaginal discharge. She is currently menstruating. Denies recent foreign travel, abx or suspicious food intake. No sick contacts w/ similar sxs. She denies marijuana use.   HPI     Past Medical History:  Diagnosis Date  . Abnormal Pap smear   . Anxiety   . Asthma   . Bipolar disorder ([place])   . Borderline personality disorder ([place])    No meds  . Depression   . Heartburn in pregnancy   . HPV in female   . HPV in female   . Hypothyroidism    takes synthroid but not taking currently  . Migraines   . Panic anxiety syndrome    no meds  . PTSD (post-traumatic stress disorder)    No meds  . Schizoaffective disorder ([place])   . Scoliosis of lumbar spine   . Termination of pregnancy    x 1    Patient Active Problem List   Diagnosis Date Noted  . Nausea 09/22/2020  . Tobacco use 09/22/2020  . GERD (gastroesophageal reflux disease) 09/22/2020  . Menorrhagia with irregular cycle 09/22/2020  . Migraine without aura and without status migrainosus, not intractable 09/22/2020  . Moderate persistent asthma without complication 11/94/1740  . Acquired hypothyroidism 08/19/2020  . Loud snoring 08/19/2020  . Obesity, morbid,  BMI 40.0-49.9 ([place]) 08/19/2020  . Cyst of skin 08/19/2020  . 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 Schizoaffective disorder ([place]) 11/04/2016  . Posttraumatic stress disorder with dissociative symptoms 11/01/2014  . Bipolar 1 disorder ([place]) 10/11/2013  . Anxiety 06/29/2013  . Borderline personality disorder ([place]) 06/29/2013  . Previous cesarean section 05/21/2013    Past Surgical History:  Procedure Laterality Date  . CESAREAN SECTION     X 4  . CESAREAN SECTION     x 2  . CESAREAN SECTION  07/21/2012   Procedure: CESAREAN SECTION;  Surgeon: Frederico Hamman, MD;  Location: [facility];  Service: Obstetrics;  Laterality: N/A;  Repeat Cesarean Section Delivery Boy @ [phone], Apgars 9/9  . CESAREAN SECTION    . COLPOSCOPY    . DILATION AND CURETTAGE OF UTERUS    . TOOTH EXTRACTION Bilateral 09/06/2018   Procedure: DENTAL RESTORATION/EXTRACTIONS;  Surgeon: Diona Browner, DDS;  Location: [facility];  Service: Oral Surgery;  Laterality: Bilateral;  . TUBAL LIGATION    . WISDOM TOOTH EXTRACTION  2009  . WISDOM TOOTH EXTRACTION       OB History    Gravida  5   Para  3   Term  2   Preterm  1   AB  1   Living  2     SAB  0   IAB  1   Ectopic  0  Multiple      Live Births  2           Family History  Problem Relation Age of Onset  . Cancer Mother   . Heart disease Mother   . Dementia Maternal Grandfather   . Anesthesia problems Neg Hx     Social History   Tobacco Use  . Smoking status: Current Every Day Smoker    Packs/day: 1.50    Years: 21.00    Pack years: 31.50    Types: Cigarettes  . Smokeless tobacco: Current User  Vaping Use  . Vaping Use: Never used  Substance Use Topics  . Alcohol use: Yes    Comment: occas  . Drug use: No    Home Medications Prior to Admission medications   Medication Sig Start Date End Date Taking? Authorizing Provider  budesonide-formoterol (SYMBICORT)  80-4.5 MCG/ACT inhaler Inhale 2 puffs into the lungs 2 (two) times daily. 08/19/20   Ladell Pier, MD  busPIRone (BUSPAR) 10 MG tablet SMARTSIG:1 Tablet(s) By Mouth Morning-Evening Patient not taking: Reported on 09/22/2020 05/02/20   [provider]  desvenlafaxine (PRISTIQ) 100 MG 24 hr tablet Take 1 tablet (100 mg total) by mouth daily. 09/22/20   Elsie Stain, MD  gabapentin (NEURONTIN) 800 MG tablet Take 800 mg by mouth 3 (three) times daily. 03/05/19   [provider]  levothyroxine (SYNTHROID) 50 MCG tablet Take 1 tablet (50 mcg total) by mouth every morning. 09/22/20   Elsie Stain, MD  montelukast (SINGULAIR) 10 MG tablet Take 10 mg by mouth at bedtime.    [provider]  ondansetron (ZOFRAN) 4 MG tablet Take 1 tablet (4 mg total) by mouth every 8 (eight) hours as needed for nausea or vomiting. 09/22/20   Elsie Stain, MD  pantoprazole (PROTONIX) 40 MG tablet Take 1 tablet (40 mg total) by mouth daily before breakfast. 09/22/20   Elsie Stain, MD  prazosin (MINIPRESS) 2 MG capsule Take 2 capsules (4 mg total) by mouth at bedtime. 11/13/16   Derrill Center, NP  traZODone (DESYREL) 150 MG tablet Take 225 mg by mouth at bedtime.     [provider]    Allergies    Banana, Latex, Poison oak extract [poison oak extract], Tomato, Banana, Latex, Poison ivy extract [poison ivy extract], and Tomato  Review of Systems   Review of Systems  Constitutional: Positive for chills. Negative for fever.  Respiratory: Negative for shortness of breath.   Cardiovascular: Negative for chest pain.  Gastrointestinal: Positive for abdominal pain, diarrhea, nausea and vomiting. Negative for anal bleeding, blood in stool and constipation.  Genitourinary: Positive for vaginal bleeding (menstruating). Negative for dysuria, frequency, urgency and vaginal discharge.  Neurological: Negative for syncope.  All other systems reviewed and are  negative.   Physical Exam Updated Vital Signs BP 134/76 (BP Location: Right Arm)   Pulse (!) 108   Temp (!) 97.4 F (36.3 C) (Oral)   Resp (!) 32   LMP 11/21/2020   SpO2 98%   Physical Exam Vitals and nursing note reviewed.  Constitutional:      Appearance: She is well-developed. She is not toxic-appearing.     Comments: Intermittently tearful.   HENT:     Head: Normocephalic and atraumatic.  Eyes:     General:        Right eye: No discharge.        Left eye: No discharge.     Conjunctiva/sclera: Conjunctivae normal.  Cardiovascular:     Rate and Rhythm: Regular rhythm. Tachycardia present.  Pulmonary:     Effort: Pulmonary effort is normal. No respiratory distress.     Breath sounds: Normal breath sounds. No wheezing, rhonchi or rales.  Abdominal:     General: Bowel sounds are normal. There is no distension.     Palpations: Abdomen is soft.     Tenderness: There is generalized abdominal tenderness.  Musculoskeletal:     Cervical back: Neck supple.  Skin:    General: Skin is warm and dry.     Findings: No rash.  Neurological:     Mental Status: She is alert.     Comments: Clear speech.   Psychiatric:        Mood and Affect: Mood is anxious. Affect is tearful.    ED Results / Procedures / Treatments   Labs (all labs ordered are listed, but only abnormal results are displayed) Labs Reviewed  COMPREHENSIVE METABOLIC PANEL - Abnormal; Notable for the following components:      Result Value   Glucose, Bld 135 (*)    Creatinine, Ser 1.01 (*)    All other components within normal limits  LIPASE, BLOOD  CBC  URINALYSIS, ROUTINE W REFLEX MICROSCOPIC  I-STAT BETA HCG BLOOD, ED ([facility], [facility], [facility] ONLY)    EKG None  Radiology CT Abdomen Pelvis W Contrast  Result Date: 11/22/2020 CLINICAL DATA:  Generalized abdominal pain with nausea, vomiting, and diarrhea for 3 days. EXAM: CT ABDOMEN AND PELVIS WITH CONTRAST TECHNIQUE: Multidetector CT imaging of the abdomen and  pelvis was performed using the standard protocol following bolus administration of intravenous contrast. CONTRAST:  146mL OMNIPAQUE IOHEXOL 300 MG/ML  SOLN COMPARISON:  None. FINDINGS: Lower chest: The lung bases are clear. No focal airspace disease or pleural fluid. Hepatobiliary: Elongated right lobe of the liver spanning 21 cm cranial caudal. Focal fatty infiltration adjacent to the falciform ligament. No other focal liver abnormality. Gallbladder physiologically distended, no calcified stone. No biliary dilatation. Pancreas: Proximal pancreatic duct is mildly dilated of 5 mm. No evidence of obstructing lesion or mass. No peripancreatic fat stranding or inflammation. No distal pancreatic atrophy. Distal pancreatic duct is normal in caliber. Spleen: Normal in size without focal abnormality. Adrenals/Urinary Tract: Normal adrenal glands. No hydronephrosis or perinephric edema. Homogeneous renal enhancement with symmetric excretion on delayed phase imaging. Urinary bladder is partially distended without wall thickening. Stomach/Bowel: Decompressed stomach. Normal positioning of the duodenum and ligament of Treitz. No small bowel obstruction, wall thickening or inflammation. Normal air-filled appendix. Small volume of colonic stool. No colonic wall thickening or pericolonic inflammation. Vascular/Lymphatic: Normal caliber abdominal aorta. Patent portal and mesenteric veins. Incidental retroaortic left renal vein. No acute vascular findings. No enlarged lymph nodes in the abdomen or pelvis. Reproductive: Uterus and bilateral adnexa are unremarkable. Other: No free air, free fluid, or intra-abdominal fluid collection. Caesarean section scar noted in the lower abdominal wall. Musculoskeletal: There are no acute or suspicious osseous abnormalities. Mild facet hypertrophy at L5-S1 IMPRESSION: 1. No acute abnormality in the abdomen/pelvis. 2. Proximal pancreatic duct is mildly dilated of 5 mm. No evidence of obstructing  lesion or pancreatic inflammation. In the setting of normal pancreatic enzymes, this is of doubtful clinical significance. Electronically Signed   By: Keith Rake M.D.   On: 11/22/2020 15:11    Procedures Procedures   Medications Ordered in ED Medications  sodium chloride 0.9 % bolus 1,000 mL (has no administration in time range)  ondansetron (ZOFRAN) injection  4 mg (has no administration in time range)  famotidine (PEPCID) IVPB 20 mg premix (has no administration in time range)  morphine 4 MG/ML injection 4 mg (has no administration in time range)    ED Course  I have reviewed the triage vital signs and the nursing notes.  Pertinent labs & imaging results that were available during my care of the patient were reviewed by me and considered in my medical decision making (see chart for details).    MDM Rules/Calculators/A&P                         Patient presents to the ED with complaints of abdominal pain with N/V/D. Nontoxic, tachycardic, not tachypneic on my assessment, intermittently tearful. Exam w/ generalized tenderness to the abdomen, no focal tenderness or peritoneal signs.  Additional history obtained:  Additional history obtained from chart review & nursing note review.   Lab Tests:  I reviewed and interpreted labs, which included:  CBC, CMP, lipase, preg test: Mild creatinine & glucose elevation- otherwise unremarkable UA discontinued as patient without urinary sxs and is on her menstrual cycle.   DDX: Viral GI illness, diverticulitis, colitis, appendicitis, perforation, obstruction, cholecystitis.  Given patient's degree of described discomfort plan for CT A/P, zofran ordered for nausea, morphine for pain, and fluids for hydration.   Imaging Studies ordered:  I ordered imaging studies which included CT A/P which is 1. No acute abnormality in the abdomen/pelvis. 2. Proximal pancreatic duct is mildly dilated of 5 mm. No evidence of obstructing lesion or pancreatic  inflammation. In the setting of normal pancreatic enzymes, this is of doubtful clinical significance- lipase WNL.   High suspicion for viral process. Patient remains with mild discomfort on re-evaluation- will give GI cocktail & Po challenge.   15:27: Patient care signed out to Dr. Suzzanne Cloud @ change of shift pending PO challenge. If no significant abnormalities & able to tolerate PO anticipate discharge home.   Findings and plan of care discussed with supervising physician Dr. Melina Copa who is in agreement.   Portions of this note were generated with [employer]. Dictation errors may occur despite best attempts at proofreading.  Final Clinical Impression(s) / ED Diagnoses Final diagnoses:  Generalized abdominal pain  Nausea vomiting and diarrhea    Rx / DC Orders ED Discharge Orders    None       Amaryllis Dyke, PA-C 11/22/20 1527    Hayden Rasmussen, MD 11/22/20 1736

## 2020-11-22 NOTE — Discharge Instructions (Addendum)
Your labs & CT scan were overall reassuring.  We are sending you home with the following medications to help with your symptoms:  - Bentyl- please take 1 tablet every 8 hours as needed for abdominal pain/cramping/spasms.  - Carafate- please take prior to each meal and prior to bedtime to help with stomach acidity/pain.  - Zofran- please take every 8 hours as needed for nausea/vomiting.   We have prescribed you new medication(s) today. Discuss the medications prescribed today with your pharmacist as they can have adverse effects and interactions with your other medicines including over the counter and prescribed medications. Seek medical evaluation if you start to experience new or abnormal symptoms after taking one of these medicines, seek care immediately if you start to experience difficulty breathing, feeling of your throat closing, facial swelling, or rash as these could be indications of a more serious allergic reaction  Please follow attached diet guidelines.  We have tested you for COVID 19- we will call you if these results are positive. If positive please follow current CDC guidelines for isolation.   Follow up with your primary care provider within 3 days for re-evaluation.  Return to the ER for new or worsening symptoms including but not limited to worsened pain, new pain, inability to keep fluids down, blood in vomit/stool, passing out, or any other concerns.

## 2020-11-22 NOTE — ED Triage Notes (Signed)
C/o generalized abd pain, nausea, vomiting, and diarrhea since Wednesday.

## 2020-11-22 NOTE — ED Notes (Signed)
Pt in triage, hyperventilating, hysterically crying and dry heaving excessively.

## 2020-11-22 NOTE — ED Notes (Signed)
Pt tolerated ginger ale, only felt nauseous but did not have to vomit.

## 2020-11-27 ENCOUNTER — Encounter: Payer: Medicaid Other | Admitting: Obstetrics & Gynecology

## 2020-12-04 ENCOUNTER — Ambulatory Visit: Payer: Medicaid Other | Admitting: Neurology

## 2020-12-08 ENCOUNTER — Ambulatory Visit (HOSPITAL_BASED_OUTPATIENT_CLINIC_OR_DEPARTMENT_OTHER): Payer: Medicaid Other | Admitting: Internal Medicine

## 2021-01-06 ENCOUNTER — Encounter: Payer: Self-pay | Admitting: Internal Medicine

## 2021-01-06 ENCOUNTER — Other Ambulatory Visit: Payer: Self-pay

## 2021-01-06 ENCOUNTER — Ambulatory Visit: Payer: Medicaid Other | Attending: Nurse Practitioner | Admitting: Internal Medicine

## 2021-01-06 DIAGNOSIS — K580 Irritable bowel syndrome with diarrhea: Secondary | ICD-10-CM | POA: Diagnosis not present

## 2021-01-06 DIAGNOSIS — F319 Bipolar disorder, unspecified: Secondary | ICD-10-CM | POA: Diagnosis not present

## 2021-01-06 DIAGNOSIS — Z720 Tobacco use: Secondary | ICD-10-CM

## 2021-01-06 MED ORDER — HYOSCYAMINE SULFATE 0.125 MG SL SUBL
0.1250 mg | SUBLINGUAL_TABLET | Freq: Four times a day (QID) | SUBLINGUAL | Status: AC | PRN
Start: 2021-01-06 — End: ?

## 2021-01-06 MED ORDER — PROMETHAZINE HCL 6.25 MG/5ML PO SYRP: ORAL_SOLUTION | ORAL | 0 refills | Status: AC

## 2021-01-06 NOTE — Progress Notes (Signed)
Virtual Visit via Telephone Note  I connected with Suzanne Klein on 01/06/21 at  1:30 PM EDT by telephone and verified that I am speaking with the correct person using two identifiers.  Location: Patient: [place] Provider: home   I discussed the limitations, risks, security and privacy concerns of performing an evaluation and management service by telephone and the availability of in person appointments. I also discussed with the patient that there may be a patient responsible charge related to this service. The patient expressed understanding and agreed to proceed.   History of Present Illness: Diagnosed with IBS 20 yrs ago sx worse for the past 3-5 yrs. Sx include nausea, occas vomiting, abd pain, feels urge to defecate after eating. +liquidy stools, sees undigested food, ?4-5 bm's/day. No hematochezia, no melena.  Wt 240-250 lbs, fluctuates, goes up and down. Eats healthy.  No effective meds in past.  Colonoscopy - few polyps (neg) 2 yrs ago. Dx w IBS.  Eating healthy, gluten free diet,  Coffee 1 daily Zofran caused h/a Bentyl not effective,  Req pain med  Current Outpatient Medications on File Prior to Visit  Medication Sig Dispense Refill  . gabapentin (NEURONTIN) 800 MG tablet Take 800 mg by mouth 3 (three) times daily.    . hyoscyamine (LEVSIN SL) 0.125 MG SL tablet Place 0.125 mg under the tongue every 6 (six) hours as needed for cramping.    Marland Kitchen levothyroxine (SYNTHROID) 50 MCG tablet Take 1 tablet (50 mcg total) by mouth every morning. 30 tablet 6  . montelukast (SINGULAIR) 10 MG tablet Take 10 mg by mouth at bedtime.    . pantoprazole (PROTONIX) 40 MG tablet Take 1 tablet (40 mg total) by mouth daily before breakfast. 30 tablet 3  . prazosin (MINIPRESS) 2 MG capsule Take 2 capsules (4 mg total) by mouth at bedtime. 30 capsule 0  . traZODone (DESYREL) 150 MG tablet Take 225 mg by mouth at bedtime.     . budesonide-formoterol (SYMBICORT) 80-4.5 MCG/ACT inhaler  Inhale 2 puffs into the lungs 2 (two) times daily. 1 each 3  . busPIRone (BUSPAR) 10 MG tablet SMARTSIG:1 Tablet(s) By Mouth Morning-Evening (Patient not taking: Reported on 09/22/2020)    . desvenlafaxine (PRISTIQ) 100 MG 24 hr tablet Take 1 tablet (100 mg total) by mouth daily.  3  . ondansetron (ZOFRAN ODT) 4 MG disintegrating tablet Take 1 tablet (4 mg total) by mouth every 8 (eight) hours as needed for nausea or vomiting. 10 tablet 0  . sucralfate (CARAFATE) 1 GM/10ML suspension Take 10 mLs (1 g total) by mouth 4 (four) times daily -  with meals and at bedtime. (Patient not taking: Reported on 01/06/2021) 420 mL 0   No current facility-administered medications on file prior to visit.      Assessment and Plan: 1. Irritable bowel syndrome with diarrhea Sx stable x 20 yrs, colonoscopy 2 yrs ago, discussed tx options, continue diet,  trial of levsin, phenergan qid prn for nausea. Declines zofran. Reports bentyl not effective. Also req pain med, advised tylenol qid prn. Monitor sx, call if sx nb with above  2. Tobacco use Has reduced use, encouraged to quit smoking  3. Bipolar 1 disorder ([place]) tx per [facility].   Follow Up Instructions:    I discussed the assessment and treatment plan with the patient. The patient was provided an opportunity to ask questions and all were answered. The patient agreed with the plan and demonstrated an understanding of the instructions.   The patient  was advised to call back or seek an in-person evaluation if the symptoms worsen or if the condition fails to improve as anticipated.  I provided 35 minutes of non-face-to-face time during this encounter.   Kimber Relic, MD

## 2021-01-06 NOTE — Progress Notes (Signed)
DOB and name verified.  C/O HX of IBS, abdominal pain, nausea, diarrhea X 2 months. Well aware of foods variety and what chose. C/o lymph node enlargement under the right axilla for a day, does not feel anything today.    Suffers of major depression but under psych care

## 2021-01-08 ENCOUNTER — Telehealth: Payer: Medicaid Other | Admitting: Nurse Practitioner

## 2021-01-28 ENCOUNTER — Ambulatory Visit (HOSPITAL_BASED_OUTPATIENT_CLINIC_OR_DEPARTMENT_OTHER): Payer: Medicaid Other | Attending: Internal Medicine | Admitting: Internal Medicine

## 2021-02-02 NOTE — Progress Notes (Signed)
NEUROLOGY CONSULTATION NOTE  Suzanne Klein MRN: [id number] DOB: 09-26-1981  Referring provider: Asencion Noble, MD Primary care provider: Karle Plumber, MD  Reason for consult:  migraines  Assessment/Plan:   1.  Migraine without aura 2.  Neuropathy  1.  Migraine prevention:  Start Aimovig 140mg  every 28 days 2.  Migraine rescue:  Ubrelvy 100mg .  Would like to avoid triptans due to possible interactions with her other medications 3.  Zofran for nausea 4.  NCV-EMG of lower extremities to evaluate neuropathy 5.  Follow up 4 to 6 months.   Subjective:  Suzanne Klein is a 40 year old right-handed female with hypothyroidism and psychiatric history (schizoaffective disorder, depression, PTSD) who presents for migraines.  History supplemented by referring provider's note.  History of migraines since 7th grade.  It is severe pounding/squeezing pain from back traveling to front or vice versa.  Sees snow.  Nausea, photophobia, phonophobia, vomiting.  Lasts 3-4 hours to all day.  Occurs 4 to 5 times a month.  Treats with Tylenol.  Heat and menstruation triggers them.  Cool temperature and vomiting help relieve them.  CT head on 07/11/2011 to evaluate for headache was personally reviewed and was normal.  Current NSAIDS/analgesics:  none Current triptans:  none Current ergotamine:  none Current anti-emetic:  none Current muscle relaxants:  none Current Antihypertensive medications:  none Current Antidepressant medications:  trazodone, Viibryd Current Anticonvulsant medications:  Lamotrigine 100mg  QD, gabapentin 800mg  TID Current anti-CGRP:  none Current Vitamins/Herbal/Supplements:  none Current Antihistamines/Decongestants:  none Other therapy:  none Hormone/birth control:  levothyroxine Other medications:  Buspar  Past NSAIDS/analgesics:  Fioricet with codeine, Fioricet, ibuprofen, naproxen, Mobic, oxycodone, tramadol Past abortive triptans: sumatriptan  NS Past abortive ergotamine:  none Past muscle relaxants:  none Past anti-emetic:  Reglan, Zofran, promethazine Past antihypertensive medications:  none Past antidepressant medications:  Venlafaxine XR, Cymbalta, sertraline, mirtazapine, Trintellix, citalopram, Prozac, Paxil Past anticonvulsant medications:  none Past anti-CGRP:  none Past vitamins/Herbal/Supplements:  none Past antihistamines/decongestants:  none Other past therapies:  none  Caffeine:  1 cup coffee daily.  No soda Diet:  drinks water.  Usually does not skip meals Depression:  yes; Anxiety:  Yes.  Schizoaffective disorder.  PTSD Other pain:  Low back pain.  Neuropathy in lower extremities, uncontrolled.  Hgb A1c 6; TSH 4.590 with free T3 3.2 and free T4 1.02 Sleep hygiene:  Poor.  Loud snoring.  Daytime somnolence.  Sleep study scheduled for next month. Family history of headache:  2 paternal uncles had aneurysms.       PAST MEDICAL HISTORY: Past Medical History:  Diagnosis Date  . Abnormal Pap smear   . Anxiety   . Asthma   . Bipolar disorder ([place])   . Borderline personality disorder ([place])    No meds  . Depression   . Heartburn in pregnancy   . HPV in female   . HPV in female   . Hypothyroidism    takes synthroid but not taking currently  . Migraines   . Panic anxiety syndrome    no meds  . PTSD (post-traumatic stress disorder)    No meds  . Schizoaffective disorder ([place])   . Scoliosis of lumbar spine   . Termination of pregnancy    x 1    PAST SURGICAL HISTORY: Past Surgical History:  Procedure Laterality Date  . CESAREAN SECTION     X 4  . CESAREAN SECTION     x 2  . CESAREAN SECTION  07/21/2012   Procedure: CESAREAN SECTION;  Surgeon: Frederico Hamman, MD;  Location: [facility];  Service: Obstetrics;  Laterality: N/A;  Repeat Cesarean Section Delivery Boy @ [phone], Apgars 9/9  . CESAREAN SECTION    . COLPOSCOPY    . DILATION AND CURETTAGE OF UTERUS    . TOOTH EXTRACTION Bilateral 09/06/2018    Procedure: DENTAL RESTORATION/EXTRACTIONS;  Surgeon: Diona Browner, DDS;  Location: [facility];  Service: Oral Surgery;  Laterality: Bilateral;  . TUBAL LIGATION    . WISDOM TOOTH EXTRACTION  2009  . WISDOM TOOTH EXTRACTION      MEDICATIONS: Current Outpatient Medications on File Prior to Visit  Medication Sig Dispense Refill  . budesonide-formoterol (SYMBICORT) 80-4.5 MCG/ACT inhaler Inhale 2 puffs into the lungs 2 (two) times daily. 1 each 3  . busPIRone (BUSPAR) 10 MG tablet SMARTSIG:1 Tablet(s) By Mouth Morning-Evening (Patient not taking: Reported on 09/22/2020)    . desvenlafaxine (PRISTIQ) 100 MG 24 hr tablet Take 1 tablet (100 mg total) by mouth daily.  3  . gabapentin (NEURONTIN) 800 MG tablet Take 800 mg by mouth 3 (three) times daily.    Marland Kitchen gabapentin (NEURONTIN) 800 MG tablet 1 tab(s)    . hydrOXYzine (ATARAX/VISTARIL) 50 MG tablet Take 50 mg by mouth 3 (three) times daily as needed.    . hyoscyamine (LEVSIN SL) 0.125 MG SL tablet Place 0.125 mg under the tongue every 6 (six) hours as needed for cramping.    . lamoTRIgine (LAMICTAL) 100 MG tablet Take 100 mg by mouth daily.    Marland Kitchen levothyroxine (SYNTHROID) 50 MCG tablet Take 1 tablet (50 mcg total) by mouth every morning. 30 tablet 6  . montelukast (SINGULAIR) 10 MG tablet Take 10 mg by mouth at bedtime.    . ondansetron (ZOFRAN ODT) 4 MG disintegrating tablet Take 1 tablet (4 mg total) by mouth every 8 (eight) hours as needed for nausea or vomiting. 10 tablet 0  . pantoprazole (PROTONIX) 40 MG tablet Take 1 tablet (40 mg total) by mouth daily before breakfast. 30 tablet 3  . prazosin (MINIPRESS) 2 MG capsule Take 2 capsules (4 mg total) by mouth at bedtime. 30 capsule 0  . promethazine (PHENERGAN) 6.25 MG/5ML syrup 10 cc qid prn nausea 120 mL 0  . sucralfate (CARAFATE) 1 GM/10ML suspension Take 10 mLs (1 g total) by mouth 4 (four) times daily -  with meals and at bedtime. (Patient not taking: Reported on 01/06/2021)  420 mL 0  . traZODone (DESYREL) 150 MG tablet Take 225 mg by mouth at bedtime.     Marland Kitchen VIIBRYD 20 MG TABS Take 1 tablet by mouth daily.     Current Facility-Administered Medications on File Prior to Visit  Medication Dose Route Frequency Provider Last Rate Last Admin  . hyoscyamine (LEVSIN SL) SL tablet 0.125 mg  0.125 mg Sublingual Q6H PRN Kimber Relic, MD        ALLERGIES: Allergies  Allergen Reactions  . Banana Anaphylaxis  . Latex Hives  . Poison Oak Extract [Poison Oak Extract] Anaphylaxis and Itching    Anaphylaxis occurs when the plant is burning    . Tomato Anaphylaxis  . Banana Nausea And Vomiting and Rash  . Latex Rash  . Poison Ivy Extract [Poison Ivy Extract] Rash  . Tomato Rash    FAMILY HISTORY: Family History  Problem Relation Age of Onset  . Cancer Mother   . Heart disease Mother   . Dementia Maternal Grandfather   . Anesthesia  problems Neg Hx     Objective:  Blood pressure 98/70, pulse (!) 111, resp. rate 18, height 5\' 6"  (1.676 m), weight 247 lb (112 kg), SpO2 96 %. General: No acute distress.  Patient appears well-groomed.   Head:  Normocephalic/atraumatic Eyes:  fundi examined but not visualized Neck: supple, no paraspinal tenderness, full range of motion Back: No paraspinal tenderness Heart: regular rate and rhythm Lungs: Clear to auscultation bilaterally. Vascular: No carotid bruits. Neurological Exam: Mental status: alert and oriented to person, place, and time, recent and remote memory intact, fund of knowledge intact, attention and concentration intact, speech fluent and not dysarthric, language intact. Cranial nerves: CN I: not tested CN II: pupils equal, round and reactive to light, visual fields intact CN III, IV, VI:  full range of motion, no nystagmus, no ptosis CN V: facial sensation intact. CN VII: upper and lower face symmetric CN VIII: hearing intact CN IX, X: gag intact, uvula midline CN XI: sternocleidomastoid and trapezius  muscles intact CN XII: tongue midline Bulk & Tone: normal, no fasciculations. Motor:  muscle strength 5/5 throughout Sensation:  Pinprick, temperature and vibratory sensation intact. Deep Tendon Reflexes:  2+ throughout,  toes downgoing.   Finger to nose testing:  Without dysmetria.   Heel to shin:  Without dysmetria.   Gait:  Normal station and stride.  Romberg negative.    Thank you for allowing me to take part in the care of this patient.  Metta Clines, DO  CC:  Asencion Noble, MD  Karle Plumber, MD

## 2021-02-03 ENCOUNTER — Encounter: Payer: Self-pay | Admitting: Neurology

## 2021-02-03 ENCOUNTER — Ambulatory Visit (INDEPENDENT_AMBULATORY_CARE_PROVIDER_SITE_OTHER): Payer: Medicaid Other | Admitting: Neurology

## 2021-02-03 ENCOUNTER — Other Ambulatory Visit: Payer: Self-pay

## 2021-02-03 VITALS — BP 98/70 | HR 111 | Resp 18 | Ht 66.0 in | Wt 247.0 lb

## 2021-02-03 DIAGNOSIS — G43009 Migraine without aura, not intractable, without status migrainosus: Secondary | ICD-10-CM

## 2021-02-03 DIAGNOSIS — G629 Polyneuropathy, unspecified: Secondary | ICD-10-CM

## 2021-02-03 MED ORDER — ONDANSETRON 4 MG PO TBDP: 4.0000 mg | ORAL_TABLET | Freq: Three times a day (TID) | ORAL | 5 refills | Status: AC | PRN

## 2021-02-03 MED ORDER — AIMOVIG 140 MG/ML ~~LOC~~ SOAJ: 140.0000 mg | SUBCUTANEOUS | 5 refills | Status: AC

## 2021-02-03 NOTE — Patient Instructions (Signed)
1.  Start Aimovig 140mg  injection every 28 days 2.  Take Ubrelvy 100mg  at earliest onset of migraine.  May repeat in 2 hours if needed.  Maximum 2 tablets in 24 hours. If effective, contact me for prescription 3.  Zofran for nausea 4.  Nerve study of legs to evaluate for neuropathy 5.  Follow up 4 to 6 months.

## 2021-02-12 ENCOUNTER — Encounter: Payer: Self-pay | Admitting: Neurology

## 2021-02-12 NOTE — Progress Notes (Signed)
Prior Approval # [id number]  Confirmation # [id number]  Recipient ID [id number] P Recipient Suzanne Klein   Status APPROVED  Effective Dates: 02/12/2021 - 05/13/2021

## 2021-02-20 ENCOUNTER — Other Ambulatory Visit: Payer: Self-pay | Admitting: Internal Medicine

## 2021-02-20 DIAGNOSIS — J4540 Moderate persistent asthma, uncomplicated: Secondary | ICD-10-CM

## 2021-02-23 ENCOUNTER — Telehealth: Payer: Self-pay | Admitting: Internal Medicine

## 2021-02-23 ENCOUNTER — Other Ambulatory Visit: Payer: Self-pay | Admitting: Internal Medicine

## 2021-02-23 DIAGNOSIS — J4540 Moderate persistent asthma, uncomplicated: Secondary | ICD-10-CM

## 2021-02-23 DIAGNOSIS — E039 Hypothyroidism, unspecified: Secondary | ICD-10-CM

## 2021-02-23 MED ORDER — PANTOPRAZOLE SODIUM 40 MG PO TBEC: 40.0000 mg | DELAYED_RELEASE_TABLET | Freq: Every day | ORAL | 0 refills | Status: AC

## 2021-02-23 MED ORDER — BUDESONIDE-FORMOTEROL FUMARATE 80-4.5 MCG/ACT IN AERO: 2.0000 | INHALATION_SPRAY | Freq: Two times a day (BID) | RESPIRATORY_TRACT | 1 refills | Status: AC

## 2021-02-23 MED ORDER — LEVOTHYROXINE SODIUM 50 MCG PO TABS: 50.0000 ug | ORAL_TABLET | Freq: Every morning | ORAL | 0 refills | Status: DC

## 2021-02-23 MED ORDER — LEVOTHYROXINE SODIUM 50 MCG PO TABS: 50.0000 ug | ORAL_TABLET | Freq: Every morning | ORAL | 0 refills | Status: AC

## 2021-02-23 NOTE — Telephone Encounter (Signed)
   Notes to clinic: Requesting paper script per previous note    Requested Prescriptions  Pending Prescriptions Disp Refills   levothyroxine (SYNTHROID) 50 MCG tablet 30 tablet 6    Sig: Take 1 tablet (50 mcg total) by mouth every morning.      Endocrinology:  Hypothyroid Agents Failed - 02/23/2021 10:20 AM      Failed - TSH needs to be rechecked within 3 months after an abnormal result. Refill until TSH is due.      Failed - TSH in normal range and within 360 days    TSH  Date Value Ref Range Status  08/19/2020 4.590 (H) 0.450 - 4.500 uIU/mL Final          Passed - Valid encounter within last 12 months    Recent Outpatient Visits           1 month ago Irritable bowel syndrome with diarrhea   [place] Kimber Relic, MD   5 months ago Nausea   [place] Elsie Stain, MD   6 months ago Need for influenza vaccination   [place], Jarome Matin, RPH-CPP   6 months ago Encounter to establish care   [place] Ladell Pier, MD

## 2021-02-23 NOTE — Telephone Encounter (Signed)
Patient is calling to request paper scripts for all of her medications with a 60 day supply for the month of June. Please advise with it is available for pick up [phone]

## 2021-02-23 NOTE — Telephone Encounter (Signed)
Suzanne Klein pt will need a follow up with Dr. Wynetta Emery her next available   Dr. Wynetta Emery pt had a virtual appt with Dr. Franco Nones on 01/06/21. Last set of labs were done when pt was in the hospital 11/22/20

## 2021-02-23 NOTE — Telephone Encounter (Signed)
Medication: levothyroxine (SYNTHROID) 50 MCG tablet [[id number]]   Has the patient contacted their pharmacy? YES (Agent: If no, request that the patient contact the pharmacy for the refill.) (Agent: If yes, when and what did the pharmacy advise?)  Preferred Pharmacy (with phone number or street name): [facility] - [place], [place] - [address] [place] [address] Phone: [phone] Fax: [phone] Hours: Not open 24 hours    Agent: Please be advised that RX refills may take up to 3 business days. We ask that you follow-up with your pharmacy.

## 2021-02-24 NOTE — Telephone Encounter (Signed)
Contacted pt and made aware that rxs are up front for pickup

## 2021-02-26 NOTE — Telephone Encounter (Signed)
Patient was scheduled for 03/13/21 for follow up.

## 2021-03-13 ENCOUNTER — Ambulatory Visit: Payer: Medicaid Other | Admitting: Internal Medicine

## 2021-04-01 ENCOUNTER — Other Ambulatory Visit: Payer: Self-pay

## 2021-04-01 ENCOUNTER — Ambulatory Visit (INDEPENDENT_AMBULATORY_CARE_PROVIDER_SITE_OTHER): Payer: Medicaid Other | Admitting: Neurology

## 2021-04-01 DIAGNOSIS — G43009 Migraine without aura, not intractable, without status migrainosus: Secondary | ICD-10-CM | POA: Diagnosis not present

## 2021-04-01 DIAGNOSIS — G8929 Other chronic pain: Secondary | ICD-10-CM

## 2021-04-01 NOTE — Procedures (Signed)
[facility]  [address]  [place], [place] [address] Tel: [phone] Fax:  [phone] Test Date:  04/01/2021  Patient: Suzanne Klein DOB: 1980-11-09 Physician: Narda Amber, DO  Sex: Female Height: 5\' 6"  Ref Phys: Metta Clines, D.O.  ID#: [id number]   Technician:    Patient Complaints: This is a 40 year old female referred for evaluation of bilateral leg paresthesias and pain.  NCV & EMG Findings: Electrodiagnostic testing of the right lower extremity and additional studies of the left shows: 1. Bilateral sural and superficial peroneal sensory responses are within normal limits. 2. Bilateral peroneal and tibial motor responses are within normal limits. 3. Bilateral tibial H reflex studies are within normal limits. 4. There is no evidence of active or chronic motor axonal changes affecting any of the tested muscles.  Motor unit configuration and recruitment pattern is within normal limits.  Impression: This is a normal study of the lower extremities.  In particular, there is no evidence of a sensorimotor polyneuropathy or lumbosacral radiculopathy.   ___________________________ Narda Amber, DO    Nerve Conduction Studies Anti Sensory Summary Table   Stim Site NR Peak (ms) Norm Peak (ms) P-T Amp (V) Norm P-T Amp  Left Sup Peroneal Anti Sensory (Ant Lat Mall)  34C  12 cm    2.2 <4.5 9.5 >5  Right Sup Peroneal Anti Sensory (Ant Lat Mall)  34C  12 cm    2.1 <4.5 8.1 >5  Left Sural Anti Sensory (Lat Mall)  34C  Calf    1.9 <4.5 16.3 >5  Right Sural Anti Sensory (Lat Mall)  34C  Calf    2.3 <4.5 13.6 >5   Motor Summary Table   Stim Site NR Onset (ms) Norm Onset (ms) O-P Amp (mV) Norm O-P Amp Site1 Site2 Delta-0 (ms) Dist (cm) Vel (m/s) Norm Vel (m/s)  Left Peroneal Motor (Ext Dig Brev)  34C  Ankle    3.6 <5.5 4.8 >3 B Fib Ankle 6.3 34.0 54 >40  B Fib    9.9  4.2  Poplt B Fib 1.4 8.0 57 >40  Poplt    11.3  4.0         Right Peroneal  Motor (Ext Dig Brev)  34C  Ankle    2.7 <5.5 5.6 >3 B Fib Ankle 7.1 39.0 55 >40  B Fib    9.8  5.6  Poplt B Fib 1.5 9.0 60 >40  Poplt    11.3  5.6         Left Tibial Motor (Abd Hall Brev)  34C  Ankle    3.3 <6.0 11.7 >8 Knee Ankle 7.6 42.0 55 >40  Knee    10.9  11.2         Right Tibial Motor (Abd Hall Brev)  34C  Ankle    3.4 <6.0 14.3 >8 Knee Ankle 7.4 45.0 61 >40  Knee    10.8  10.9          H Reflex Studies   NR H-Lat (ms) Lat Norm (ms) L-R H-Lat (ms)  Left Tibial (Gastroc)  34C     30.88 <35 0.00  Right Tibial (Gastroc)  34C     30.88 <35 0.00   EMG   Side Muscle Ins Act Fibs Psw Fasc Number Recrt Dur Dur. Amp Amp. Poly Poly. Comment  Right AntTibialis Nml Nml Nml Nml Nml Nml Nml Nml Nml Nml Nml Nml N/A  Right Gastroc Nml Nml Nml Nml Nml Nml Nml  Nml Nml Nml Nml Nml N/A  Right Flex Dig Long Nml Nml Nml Nml Nml Nml Nml Nml Nml Nml Nml Nml N/A  Right RectFemoris Nml Nml Nml Nml Nml Nml Nml Nml Nml Nml Nml Nml N/A  Left AntTibialis Nml Nml Nml Nml Nml Nml Nml Nml Nml Nml Nml Nml N/A  Left BicepsFemS Nml Nml Nml Nml Nml Nml Nml Nml Nml Nml Nml Nml N/A  Right BicepsFemS Nml Nml Nml Nml Nml Nml Nml Nml Nml Nml Nml Nml N/A  Left Gastroc Nml Nml Nml Nml Nml Nml Nml Nml Nml Nml Nml Nml N/A  Left Flex Dig Long Nml Nml Nml Nml Nml Nml Nml Nml Nml Nml Nml Nml N/A  Left RectFemoris Nml Nml Nml Nml Nml Nml Nml Nml Nml Nml Nml Nml N/A      Waveforms:

## 2021-04-02 NOTE — Progress Notes (Signed)
LMOVM to call the office back.

## 2021-04-03 ENCOUNTER — Telehealth: Payer: Self-pay | Admitting: Neurology

## 2021-04-03 NOTE — Telephone Encounter (Signed)
Pt called in to get her test results

## 2021-04-03 NOTE — Telephone Encounter (Signed)
Called patient and informed her of normal EMG results. Patient verbalized understanding and had no further questions or concerns.

## 2021-04-03 NOTE — Telephone Encounter (Signed)
EMG results per Dr. Tomi Likens- Nerve study is normal. No evidence of nerve damage

## 2021-04-06 NOTE — Progress Notes (Signed)
Patient advised.

## 2021-08-07 NOTE — Progress Notes (Deleted)
NEUROLOGY FOLLOW UP OFFICE NOTE  Suzanne Klein [id number]  Assessment/Plan:   Migraine without aura, without status migrainosus, not intractable Neuropathy  Migraine prevention:  *** Migraine rescue:  *** Limit use of pain relievers to no more than 2 days out of week to prevent risk of rebound or medication-overuse headache. Keep headache diary Follow up ***   Subjective:  Suzanne Klein is a 40 year old right-handed female with hypothyroidism and psychiatric history (schizoaffective disorder, depression, PTSD) who follows up for migraine.  UPDATE: Started Aimovig in April. ***  NCV-EMG of lower extremities on 04/01/2021 was normal.  Current NSAIDS/analgesics:  none Current triptans:  none - avoid due to possible interactions with her other medications. Current ergotamine:  none Current anti-emetic:  Zofran ODT 4mg  Current muscle relaxants:  none Current Antihypertensive medications:  none Current Antidepressant medications:  trazodone, Viibryd Current Anticonvulsant medications:  Lamotrigine 100mg  QD, gabapentin 800mg  TID Current anti-CGRP:  Aimovig 140mg , Ubrelvy 100mg  Current Vitamins/Herbal/Supplements:  none Current Antihistamines/Decongestants:  none Other therapy:  none Hormone/birth control:  levothyroxine Other medications:  Buspar  Caffeine:  1 cup coffee daily.  No soda Diet:  drinks water.  Usually does not skip meals Depression:  yes; Anxiety:  Yes.  Schizoaffective disorder.  PTSD Other pain:  Low back pain.  Neuropathy in lower extremities, uncontrolled.  Hgb A1c 6; TSH 4.590 with free T3 3.2 and free T4 1.02 Sleep hygiene:  Poor.  Loud snoring.  Daytime somnolence.  Sleep study scheduled for next month.   HISTORY: History of migraines since 7th grade.  It is severe pounding/squeezing pain from back traveling to front or vice versa.  Sees snow.  Nausea, photophobia, phonophobia, vomiting.  Lasts 3-4 hours to all day.  Occurs 4 to 5  times a month.  Treats with Tylenol.  Heat and menstruation triggers them.  Cool temperature and vomiting help relieve them.  CT head on 07/11/2011 to evaluate for headache was normal.    Past NSAIDS/analgesics:  Fioricet with codeine, Fioricet, ibuprofen, naproxen, Mobic, oxycodone, tramadol Past abortive triptans: sumatriptan NS Past abortive ergotamine:  none Past muscle relaxants:  none Past anti-emetic:  Reglan, Zofran, promethazine Past antihypertensive medications:  none Past antidepressant medications:  Venlafaxine XR, Cymbalta, sertraline, mirtazapine, Trintellix, citalopram, Prozac, Paxil Past anticonvulsant medications:  none Past anti-CGRP:  none Past vitamins/Herbal/Supplements:  none Past antihistamines/decongestants:  none Other past therapies:  none    Family history of headache:  2 paternal uncles had aneurysms.   PAST MEDICAL HISTORY: Past Medical History:  Diagnosis Date   Abnormal Pap smear    Anxiety    Asthma    Bipolar disorder ([place])    Borderline personality disorder ([place])    No meds   Depression    Heartburn in pregnancy    HPV in female    HPV in female    Hypothyroidism    takes synthroid but not taking currently   Migraines    Panic anxiety syndrome    no meds   PTSD (post-traumatic stress disorder)    No meds   Schizoaffective disorder ([facility])    Scoliosis of lumbar spine    Termination of pregnancy    x 1    MEDICATIONS: Current Outpatient Medications on File Prior to Visit  Medication Sig Dispense Refill   budesonide-formoterol (SYMBICORT) 80-4.5 MCG/ACT inhaler Inhale 2 puffs into the lungs 2 times daily at 12 noon and 4 pm. 10.2 g 1   Erenumab-aooe (AIMOVIG) 140 MG/ML SOAJ Inject  140 mg into the skin every 28 (twenty-eight) days. 1.12 mL 5   gabapentin (NEURONTIN) 800 MG tablet Take 800 mg by mouth 3 (three) times daily.     lamoTRIgine (LAMICTAL) 100 MG tablet Take 100 mg by mouth daily.     levothyroxine (SYNTHROID) 50 MCG tablet  Take 1 tablet (50 mcg total) by mouth every morning. Please schedule office visit. We need to check labs! 60 tablet 0   montelukast (SINGULAIR) 10 MG tablet Take 10 mg by mouth at bedtime.     ondansetron (ZOFRAN ODT) 4 MG disintegrating tablet Take 1 tablet (4 mg total) by mouth every 8 (eight) hours as needed for nausea or vomiting. 20 tablet 5   pantoprazole (PROTONIX) 40 MG tablet Take 1 tablet (40 mg total) by mouth daily before breakfast. 60 tablet 0   prazosin (MINIPRESS) 2 MG capsule Take 2 capsules (4 mg total) by mouth at bedtime. 30 capsule 0   promethazine (PHENERGAN) 6.25 MG/5ML syrup 10 cc qid prn nausea (Patient not taking: Reported on 02/03/2021) 120 mL 0   sucralfate (CARAFATE) 1 GM/10ML suspension Take 10 mLs (1 g total) by mouth 4 (four) times daily -  with meals and at bedtime. (Patient not taking: Reported on 01/06/2021) 420 mL 0   traZODone (DESYREL) 150 MG tablet Take 225 mg by mouth at bedtime.      VIIBRYD 20 MG TABS Take 1 tablet by mouth daily.     Current Facility-Administered Medications on File Prior to Visit  Medication Dose Route Frequency Provider Last Rate Last Admin   hyoscyamine (LEVSIN SL) SL tablet 0.125 mg  0.125 mg Sublingual Q6H PRN Kossari, Labkhand, MD        ALLERGIES: Allergies  Allergen Reactions   Banana Anaphylaxis   Latex Hives   Poison Oak Extract [Poison Oak Extract] Anaphylaxis and Itching    Anaphylaxis occurs when the plant is burning     Tomato Anaphylaxis   Banana Nausea And Vomiting and Rash   Latex Rash   Poison Ivy Extract [Poison Ivy Extract] Rash   Tomato Rash    FAMILY HISTORY: Family History  Problem Relation Age of Onset   Cancer Mother    Heart disease Mother    Dementia Maternal Grandfather    Anesthesia problems Neg Hx       Objective:  *** General: No acute distress.  Patient appears ***-groomed.   Head:  Normocephalic/atraumatic Eyes:  Fundi examined but not visualized Neck: supple, no paraspinal  tenderness, full range of motion Heart:  Regular rate and rhythm Lungs:  Clear to auscultation bilaterally Back: No paraspinal tenderness Neurological Exam: alert and oriented to person, place, and time.  Speech fluent and not dysarthric, language intact.  CN II-XII intact. Bulk and tone normal, muscle strength 5/5 throughout.  Sensation to light touch intact.  Deep tendon reflexes 2+ throughout, toes downgoing.  Finger to nose testing intact.  Gait normal, Romberg negative.   Metta Clines, DO  CC: Karle Plumber, MD

## 2021-08-10 ENCOUNTER — Ambulatory Visit: Payer: Self-pay | Admitting: Neurology
